# Patient Record
Sex: Male | Born: 1955 | Race: White | Hispanic: No | Smoking: Current every day smoker
Health system: Southern US, Community
[De-identification: ages and names within clinical notes are randomized; demographics above are authoritative.]

## PROBLEM LIST (undated history)

## (undated) DIAGNOSIS — M1990 Unspecified osteoarthritis, unspecified site: Secondary | ICD-10-CM

## (undated) DIAGNOSIS — Z8659 Personal history of other mental and behavioral disorders: Secondary | ICD-10-CM

## (undated) DIAGNOSIS — F1010 Alcohol abuse, uncomplicated: Secondary | ICD-10-CM

## (undated) DIAGNOSIS — M549 Dorsalgia, unspecified: Secondary | ICD-10-CM

## (undated) DIAGNOSIS — F1910 Other psychoactive substance abuse, uncomplicated: Secondary | ICD-10-CM

## (undated) DIAGNOSIS — T7840XA Allergy, unspecified, initial encounter: Secondary | ICD-10-CM

## (undated) DIAGNOSIS — I10 Essential (primary) hypertension: Secondary | ICD-10-CM

## (undated) DIAGNOSIS — G629 Polyneuropathy, unspecified: Secondary | ICD-10-CM

## (undated) DIAGNOSIS — H409 Unspecified glaucoma: Secondary | ICD-10-CM

## (undated) DIAGNOSIS — J449 Chronic obstructive pulmonary disease, unspecified: Secondary | ICD-10-CM

## (undated) DIAGNOSIS — H269 Unspecified cataract: Secondary | ICD-10-CM

## (undated) DIAGNOSIS — K760 Fatty (change of) liver, not elsewhere classified: Secondary | ICD-10-CM

## (undated) DIAGNOSIS — M2550 Pain in unspecified joint: Secondary | ICD-10-CM

## (undated) HISTORY — DX: Dorsalgia, unspecified: M54.9

## (undated) HISTORY — PX: LYMPH NODE BIOPSY: SHX201

## (undated) HISTORY — DX: Polyneuropathy, unspecified: G62.9

## (undated) HISTORY — PX: TONSILLECTOMY AND ADENOIDECTOMY: SHX28

## (undated) HISTORY — DX: Unspecified glaucoma: H40.9

## (undated) HISTORY — DX: Personal history of other mental and behavioral disorders: Z86.59

## (undated) HISTORY — DX: Other psychoactive substance abuse, uncomplicated: F19.10

## (undated) HISTORY — DX: Essential (primary) hypertension: I10

## (undated) HISTORY — DX: Unspecified cataract: H26.9

## (undated) HISTORY — DX: Allergy, unspecified, initial encounter: T78.40XA

## (undated) HISTORY — DX: Pain in unspecified joint: M25.50

## (undated) HISTORY — DX: Unspecified osteoarthritis, unspecified site: M19.90

---
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
## 2011-04-30 ENCOUNTER — Ambulatory Visit (HOSPITAL_COMMUNITY)
Admission: RE | Admit: 2011-04-30 | Discharge: 2011-04-30 | Disposition: A | Discharge status: Home / Self Care | Payer: PRIVATE HEALTH INSURANCE | Source: Ambulatory Visit | Attending: Family Medicine | Admitting: Family Medicine

## 2011-04-30 ENCOUNTER — Other Ambulatory Visit: Payer: Self-pay | Admitting: Family Medicine

## 2011-04-30 DIAGNOSIS — R109 Unspecified abdominal pain: Secondary | ICD-10-CM | POA: Insufficient documentation

## 2011-04-30 DIAGNOSIS — R319 Hematuria, unspecified: Secondary | ICD-10-CM | POA: Insufficient documentation

## 2011-05-10 ENCOUNTER — Ambulatory Visit (AMBULATORY_SURGERY_CENTER): Payer: PRIVATE HEALTH INSURANCE

## 2011-05-10 VITALS — Ht 74.0 in | Wt 308.1 lb

## 2011-05-10 DIAGNOSIS — Z1211 Encounter for screening for malignant neoplasm of colon: Secondary | ICD-10-CM

## 2011-05-10 DIAGNOSIS — M549 Dorsalgia, unspecified: Secondary | ICD-10-CM

## 2011-05-10 DIAGNOSIS — Z800 Family history of malignant neoplasm of digestive organs: Secondary | ICD-10-CM

## 2011-05-10 MED ORDER — PEG-KCL-NACL-NASULF-NA ASC-C 100 G PO SOLR: 1.0000 | Freq: Once | ORAL | Status: AC

## 2011-05-20 ENCOUNTER — Encounter: Payer: Self-pay | Admitting: Gastroenterology

## 2011-05-20 ENCOUNTER — Ambulatory Visit (AMBULATORY_SURGERY_CENTER): Payer: PRIVATE HEALTH INSURANCE | Admitting: Gastroenterology

## 2011-05-20 DIAGNOSIS — Z1211 Encounter for screening for malignant neoplasm of colon: Secondary | ICD-10-CM

## 2011-05-20 DIAGNOSIS — D1330 Benign neoplasm of unspecified part of small intestine: Secondary | ICD-10-CM

## 2011-05-20 DIAGNOSIS — Z800 Family history of malignant neoplasm of digestive organs: Secondary | ICD-10-CM

## 2011-05-20 DIAGNOSIS — D126 Benign neoplasm of colon, unspecified: Secondary | ICD-10-CM

## 2011-05-20 MED ORDER — SODIUM CHLORIDE 0.9 % IV SOLN: 500.0000 mL | INTRAVENOUS | Status: DC

## 2011-05-20 NOTE — Patient Instructions (Signed)
No aspirin or aspirin products such as anti-inflammatory drugs for 2 weeks. No aspirin No Aleve No ibuprofen Take Tylenol only

## 2011-05-23 ENCOUNTER — Telehealth: Payer: Self-pay | Admitting: *Deleted

## 2011-05-23 NOTE — Telephone Encounter (Signed)

## 2011-05-28 ENCOUNTER — Encounter: Payer: Self-pay | Admitting: Gastroenterology

## 2011-11-08 ENCOUNTER — Ambulatory Visit (INDEPENDENT_AMBULATORY_CARE_PROVIDER_SITE_OTHER): Payer: PRIVATE HEALTH INSURANCE

## 2011-11-08 DIAGNOSIS — Z23 Encounter for immunization: Secondary | ICD-10-CM

## 2011-11-08 DIAGNOSIS — J019 Acute sinusitis, unspecified: Secondary | ICD-10-CM

## 2012-03-17 ENCOUNTER — Ambulatory Visit: Payer: PRIVATE HEALTH INSURANCE

## 2012-03-17 ENCOUNTER — Ambulatory Visit (INDEPENDENT_AMBULATORY_CARE_PROVIDER_SITE_OTHER): Payer: PRIVATE HEALTH INSURANCE | Admitting: Emergency Medicine

## 2012-03-17 VITALS — BP 131/91 | HR 80 | Temp 98.1°F | Resp 18 | Ht 72.0 in | Wt 287.0 lb

## 2012-03-17 DIAGNOSIS — F17200 Nicotine dependence, unspecified, uncomplicated: Secondary | ICD-10-CM

## 2012-03-17 DIAGNOSIS — R7309 Other abnormal glucose: Secondary | ICD-10-CM

## 2012-03-17 DIAGNOSIS — G629 Polyneuropathy, unspecified: Secondary | ICD-10-CM

## 2012-03-17 DIAGNOSIS — G589 Mononeuropathy, unspecified: Secondary | ICD-10-CM

## 2012-03-17 DIAGNOSIS — R739 Hyperglycemia, unspecified: Secondary | ICD-10-CM

## 2012-03-17 LAB — COMPREHENSIVE METABOLIC PANEL
BUN: 13 mg/dL (ref 6–23)
CO2: 26 mEq/L (ref 19–32)
Calcium: 9.9 mg/dL (ref 8.4–10.5)
Creat: 0.94 mg/dL (ref 0.50–1.35)
Glucose, Bld: 110 mg/dL — ABNORMAL HIGH (ref 70–99)
Total Bilirubin: 0.8 mg/dL (ref 0.3–1.2)

## 2012-03-17 LAB — MAGNESIUM: Magnesium: 2 mg/dL (ref 1.5–2.5)

## 2012-03-17 LAB — VITAMIN B12: Vitamin B-12: 249 pg/mL (ref 211–911)

## 2012-03-17 LAB — POCT CBC
HCT, POC: 45.9 % (ref 43.5–53.7)
Lymph, poc: 1.7 (ref 0.6–3.4)
MCH, POC: 32.5 pg — AB (ref 27–31.2)
MCHC: 32.5 g/dL (ref 31.8–35.4)
MCV: 100.1 fL — AB (ref 80–97)
POC Granulocyte: 3.9 (ref 2–6.9)
POC LYMPH PERCENT: 28.2 %L (ref 10–50)
RDW, POC: 13.4 %

## 2012-03-17 LAB — POCT GLYCOSYLATED HEMOGLOBIN (HGB A1C): Hemoglobin A1C: 5

## 2012-03-17 LAB — GLUCOSE, POCT (MANUAL RESULT ENTRY): POC Glucose: 104 mg/dl — AB (ref 70–99)

## 2012-03-17 MED ORDER — GABAPENTIN 300 MG PO CAPS: ORAL_CAPSULE | ORAL | Status: DC

## 2012-03-17 NOTE — Progress Notes (Signed)
  Subjective:    Patient ID: Elijah Barton, male    DOB: March 30, 1956, 56 y.o.   MRN: [id number]  HPI the patient presents with a two-month history of tingling in his feet he does not have any associated weakness in his legs. He just feels this sensation of numbness in both of his feet extending up to his distal legs. Patient's history is pertinent in that he drinks half pint or a pint every day. He smokes 2-3 packs of cigarettes a day. On his last set of labs his sugar was elevated.    Review of Systems     Objective:   Physical Exam  Constitutional: He is oriented to person, place, and time. He appears well-developed and well-nourished.  HENT:  Head: Normocephalic and atraumatic.  Eyes: Pupils are equal, round, and reactive to light.  Neck: No tracheal deviation present. No thyromegaly present.  Cardiovascular: Normal rate, regular rhythm and normal heart sounds.   Pulmonary/Chest: No respiratory distress. He has no wheezes. He has no rales.  Abdominal: Soft. He exhibits no distension. There is no tenderness. There is no rebound.  Neurological: He is alert and oriented to person, place, and time. He has normal reflexes. No cranial nerve deficit.       There is decreased vibratory sensation in both feet. Position sense is maintained. The alignment testing revealed decreased sensation on the plantar surface of both feet. His deep tendon reflexes in the ankles and knees with maintained. Motor strength is 5 out of 5 in the lower extremities.    No results found for this or any previous visit.  Results for orders placed in visit on 03/17/12  POCT CBC      Component Value Range   WBC 6.0  4.6 - 10.2 (K/uL)   Lymph, poc 1.7  0.6 - 3.4    POC LYMPH PERCENT 28.2  10 - 50 (%L)   MID (cbc) 0.4  0 - 0.9    POC MID % 6.0  0 - 12 (%M)   POC Granulocyte 3.9  2 - 6.9    Granulocyte percent 65.8  37 - 80 (%G)   RBC 4.59 (*) 4.69 - 6.13 (M/uL)   Hemoglobin 14.9  14.1 - 18.1 (g/dL)   HCT, POC 28.4   13.2 - 53.7 (%)   MCV 100.1 (*) 80 - 97 (fL)   MCH, POC 32.5 (*) 27 - 31.2 (pg)   MCHC 32.5  31.8 - 35.4 (g/dL)   RDW, POC 44.0     Platelet Count, POC 218  142 - 424 (K/uL)   MPV 9.8  0 - 99.8 (fL)  GLUCOSE, POCT (MANUAL RESULT ENTRY)      Component Value Range   POC Glucose 104 (*) 70 - 99 (mg/dl)  POCT GLYCOSYLATED HEMOGLOBIN (HGB A1C)      Component Value Range   Hemoglobin A1C 5.0     [facility] reading (PRIMARY) by  Dr. Cleta Alberts there is prominence of the right infrahilar area. There is no definite mass seen on the lateral.      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 We'll check routine labs as well as a chest x-ray and discuss potentially doing EMG nerve conduction studies to confirm the diagnosis.

## 2012-03-18 LAB — RPR

## 2012-03-22 ENCOUNTER — Ambulatory Visit
Admission: RE | Admit: 2012-03-22 | Discharge: 2012-03-22 | Disposition: A | Discharge status: Home / Self Care | Payer: Self-pay | Source: Ambulatory Visit | Attending: Emergency Medicine | Admitting: Emergency Medicine

## 2012-03-22 DIAGNOSIS — F17200 Nicotine dependence, unspecified, uncomplicated: Secondary | ICD-10-CM

## 2012-05-20 ENCOUNTER — Ambulatory Visit (INDEPENDENT_AMBULATORY_CARE_PROVIDER_SITE_OTHER): Payer: PRIVATE HEALTH INSURANCE | Admitting: Family Medicine

## 2012-05-20 VITALS — BP 131/85 | HR 84 | Temp 98.5°F | Resp 16 | Ht 72.25 in | Wt 282.0 lb

## 2012-05-20 DIAGNOSIS — L570 Actinic keratosis: Secondary | ICD-10-CM

## 2012-05-20 DIAGNOSIS — L918 Other hypertrophic disorders of the skin: Secondary | ICD-10-CM

## 2012-05-20 DIAGNOSIS — I10 Essential (primary) hypertension: Secondary | ICD-10-CM

## 2012-05-20 DIAGNOSIS — Q828 Other specified congenital malformations of skin: Secondary | ICD-10-CM

## 2012-05-20 NOTE — Progress Notes (Signed)
Subjective: Patient is here for some skin lesions have been concerning him. He has a tag under his right arm that is swollen up and turned dark in the last few days. He worries about skin cancers. He has a little lesion on his right forearm which is crusted. He has multiple other small skin tags. He is not diabetic, though was told he might be prediabetic. He has been working on his weight. 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  Objective: Obese white male in no acute distress. He has a lot of small skin tags in his right axilla, some tiny ones in the left. He has one large tag about 1 CM in diameter at the tip of it which is black and blue looking at the tip of it, with a pink stalk measuring about 4 mm in diameter and 1 cm long. He also has a crusted lesion with almost a verrucous look to it on the right forearm. However it does not look like a wart., More like an actinic keratosis.  Assessment: Ischemic skin tag right axilla Multiple benign skin tags Actinic keratosis right forearm  Plan: Shave biopsy the larger skin today in the actinic keratosis. He is agreeable to this. Told him it would  leave little burn scars.  Procedure note: Lesions with up with 1% lidocaine with epinephrine. The lesions were prepped with alcohol swab. Shave biopsy performed of the large necrotic A. right axilla, as well as 2 small ones. The bases were cauterized. The large and was sent to pathology.  The actinic keratosis on the right forearm was also removed by shave biopsy. It measured less than 0.5 cm. It was sent for pathology. The base was cauterized. Patient tolerated procedures well. He was instructed in the care.

## 2012-05-20 NOTE — Patient Instructions (Addendum)
Local burn care of the wounds. Keep them clean, use a little Polysporin ointment, and a Band-Aid.  I will let you know the results when the pathology comes back.  If you do not hear from me in 2 weeks call back to check on the report.

## 2012-05-27 ENCOUNTER — Encounter: Payer: Self-pay | Admitting: Family Medicine

## 2012-06-03 ENCOUNTER — Ambulatory Visit: Payer: PRIVATE HEALTH INSURANCE

## 2012-06-03 ENCOUNTER — Ambulatory Visit (INDEPENDENT_AMBULATORY_CARE_PROVIDER_SITE_OTHER): Payer: PRIVATE HEALTH INSURANCE | Admitting: Emergency Medicine

## 2012-06-03 VITALS — BP 148/98 | HR 78 | Temp 98.4°F | Resp 17 | Ht 72.0 in | Wt 285.0 lb

## 2012-06-03 DIAGNOSIS — H5710 Ocular pain, unspecified eye: Secondary | ICD-10-CM

## 2012-06-03 DIAGNOSIS — M549 Dorsalgia, unspecified: Secondary | ICD-10-CM

## 2012-06-03 DIAGNOSIS — H5712 Ocular pain, left eye: Secondary | ICD-10-CM

## 2012-06-03 DIAGNOSIS — L0291 Cutaneous abscess, unspecified: Secondary | ICD-10-CM

## 2012-06-03 DIAGNOSIS — L0390 Cellulitis, unspecified: Secondary | ICD-10-CM

## 2012-06-03 LAB — POCT CBC
Granulocyte percent: 69.2 %G (ref 37–80)
HCT, POC: 51.6 % (ref 43.5–53.7)
Hemoglobin: 16 g/dL (ref 14.1–18.1)
POC Granulocyte: 4.7 (ref 2–6.9)
RBC: 5.02 M/uL (ref 4.69–6.13)

## 2012-06-03 MED ORDER — DOXYCYCLINE HYCLATE 100 MG PO TABS: 100.0000 mg | ORAL_TABLET | Freq: Two times a day (BID) | ORAL | Status: AC

## 2012-06-03 MED ORDER — HYDROCODONE-ACETAMINOPHEN 5-325 MG PO TABS
1.0000 | ORAL_TABLET | Freq: Four times a day (QID) | ORAL | Status: AC | PRN
Start: 2012-06-03 — End: 2012-06-13

## 2012-06-03 MED ORDER — CYCLOBENZAPRINE HCL 10 MG PO TABS: ORAL_TABLET | ORAL | Status: DC

## 2012-06-03 MED ORDER — DOXYCYCLINE HYCLATE 100 MG PO TABS: 100.0000 mg | ORAL_TABLET | Freq: Two times a day (BID) | ORAL | Status: DC

## 2012-06-03 MED ORDER — VALACYCLOVIR HCL 1 G PO TABS: 1000.0000 mg | ORAL_TABLET | Freq: Two times a day (BID) | ORAL | Status: DC

## 2012-06-03 NOTE — Progress Notes (Signed)
  Subjective:    Patient ID: Elijah Barton, male    DOB: Mar 17, 1956, 56 y.o.   MRN: [id number]  HPI she has a five-day history of pain and discomfort in the area beneath his left eye. He had a cold sore on his lip about 3 weeks ago this resolved without treatment . He has had no visual problems or pain in his eye. He also has had midthoracic back pain. 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 He initially was seen a chiropractor but this became very expensive so he stopped seeing them.   Review of Systems     Objective:   Physical Exam  Constitutional: He appears well-developed and well-nourished.  HENT:  Head: Normocephalic.  Eyes: Pupils are equal, round, and reactive to light.  Cardiovascular: Normal rate and regular rhythm.   Pulmonary/Chest: Effort normal and breath sounds normal.  Musculoskeletal:       There is tenderness over the parathoracic area and flank areas bilaterally. There is no tenderness directly over the T-spine.    [facility] reading (PRIMARY) by  Dr. Cleta Alberts is a prominent inferior right perihilar area. There is also question of a compression fracture in the lower thoracic spine.   Results for orders placed in visit on 06/03/12  POCT CBC      Component Value Range   WBC 6.8  4.6 - 10.2 K/uL   Lymph, poc 1.6  0.6 - 3.4   POC LYMPH PERCENT 24.0  10 - 50 %L   MID (cbc) 0.5  0 - 0.9   POC MID % 6.8  0 - 12 %M   POC Granulocyte 4.7  2 - 6.9   Granulocyte percent 69.2  37 - 80 %G   RBC 5.02  4.69 - 6.13 M/uL   Hemoglobin 16.0  14.1 - 18.1 g/dL   HCT, POC 40.9  81.1 - 53.7 %   MCV 102.7 (*) 80 - 97 fL   MCH, POC 31.9 (*) 27 - 31.2 pg   MCHC 31.0 (*) 31.8 - 35.4 g/dL   RDW, POC 91.4     Platelet Count, POC 211  142 - 424 K/uL   MPV 9.5  0 - 99.8 fL       Assessment & Plan:  We'll treat with Valtrex and doxycycline for his infection beneath the left. Eye. His chest x-rays questionably abnormal in the inferior  hilar area. He also may have an old thoracic compression fracture. I am going to suggest that he have a CT of his chest to be sure there is no cancer there because of his heavy smoking history.

## 2012-06-04 LAB — COMPREHENSIVE METABOLIC PANEL
Albumin: 4.3 g/dL (ref 3.5–5.2)
Alkaline Phosphatase: 70 U/L (ref 39–117)
CO2: 28 mEq/L (ref 19–32)
Glucose, Bld: 91 mg/dL (ref 70–99)
Potassium: 4.9 mEq/L (ref 3.5–5.3)
Sodium: 139 mEq/L (ref 135–145)
Total Protein: 7.2 g/dL (ref 6.0–8.3)

## 2012-06-10 ENCOUNTER — Other Ambulatory Visit: Payer: Self-pay | Admitting: Family Medicine

## 2012-06-11 NOTE — Telephone Encounter (Signed)
Deny, needs OV to discuss.

## 2012-06-12 ENCOUNTER — Other Ambulatory Visit: Payer: Self-pay

## 2012-06-12 ENCOUNTER — Telehealth: Payer: Self-pay

## 2012-06-12 MED ORDER — NAPROXEN 500 MG PO TABS: 500.0000 mg | ORAL_TABLET | Freq: Two times a day (BID) | ORAL | Status: DC

## 2012-06-12 NOTE — Telephone Encounter (Signed)
I called patient to find out what meds he is in need of, he does not know. I have advised him to contact his pharmacy and have them send [place] there request.

## 2012-06-12 NOTE — Telephone Encounter (Signed)
Pt is requesting dr Cleta Alberts reactive all of his medications.  The order has expired.  Recently seen for Back pain cyclobenzaprine (FLEXERIL) 10 MG tablet He does not remember the names of the other medication in which he needs refilled also.

## 2012-07-21 ENCOUNTER — Telehealth: Payer: Self-pay

## 2012-07-21 NOTE — Telephone Encounter (Signed)
They are safe to take together he just needs to be mindful of sedating effect of both medications. He should not take them both and drive or go to work.

## 2012-07-21 NOTE — Telephone Encounter (Signed)
Spoke with pt advised message from [place]. Pt understood

## 2012-07-21 NOTE — Telephone Encounter (Signed)
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  WANTS TO KNOW IF HE CAN TAKE BOTH OF THEM AT THE SAME TIME WITHOUT IT CAUSING AN ADVERSE REACTION.  HE USUALLY DOESN'T TAKE ANYTHING THROUGH THE WEEK, BUT HE IS HAVING A VERY HARD TIME TODAY.  PLEASE CALL [phone]

## 2012-08-28 ENCOUNTER — Ambulatory Visit (INDEPENDENT_AMBULATORY_CARE_PROVIDER_SITE_OTHER): Payer: PRIVATE HEALTH INSURANCE | Admitting: Physician Assistant

## 2012-08-28 ENCOUNTER — Telehealth: Payer: Self-pay

## 2012-08-28 VITALS — BP 153/84 | HR 97 | Temp 97.6°F | Resp 16 | Ht 71.75 in | Wt 282.8 lb

## 2012-08-28 DIAGNOSIS — M549 Dorsalgia, unspecified: Secondary | ICD-10-CM

## 2012-08-28 DIAGNOSIS — M62838 Other muscle spasm: Secondary | ICD-10-CM

## 2012-08-28 MED ORDER — HYDROCODONE-ACETAMINOPHEN 5-325 MG PO TABS: 1.0000 | ORAL_TABLET | Freq: Four times a day (QID) | ORAL | Status: DC | PRN

## 2012-08-28 MED ORDER — CYCLOBENZAPRINE HCL 10 MG PO TABS: ORAL_TABLET | ORAL | Status: DC

## 2012-08-28 NOTE — Progress Notes (Signed)
  Subjective:    Patient ID: Elijah Barton, male    DOB: [birth year], 56 y.o.   MRN: [id number]  HPI 56 year old male presents with 10 day history of right sided back pain and muscle spasm. States he had an injury in August 2013 and was treated with pain medication and a muscle relaxer which helped. He did not get back to 100%, but states his pain was much improved.  He was working in the yard 10 days ago and was bending over and felt like he pulled a muscle. He has been taking what he had left of his Vicodin and Flexeril which has helped.  Denies paresthesias, weakness, loss of bowel/bladder control, saddle anesthesia, urinary frequency, dysuria, or abdominal pain.      Review of Systems  Constitutional: Negative for fever and chills.  Gastrointestinal: Negative for nausea, vomiting and abdominal pain.  Genitourinary: Negative for dysuria, urgency, frequency and flank pain.  Musculoskeletal: Positive for myalgias (muscle spasm) and back pain. Negative for joint swelling, arthralgias and gait problem.  All other systems reviewed and are negative.       Objective:   Physical Exam  Constitutional: He is oriented to person, place, and time. He appears well-developed and well-nourished.  HENT:  Head: Normocephalic and atraumatic.  Right Ear: External ear normal.  Left Ear: External ear normal.  Eyes: Conjunctivae normal are normal.  Neck: Normal range of motion.  Cardiovascular: Normal rate, regular rhythm and normal heart sounds.   Pulmonary/Chest: Effort normal and breath sounds normal.  Abdominal: There is no tenderness (no CVA tenderness bilaterally).  Musculoskeletal:       Thoracic back: He exhibits tenderness (right paraspinal) and spasm (on right).       Lumbar back: He exhibits tenderness (right paraspinal, no midline or bony tenderness) and spasm (right). He exhibits normal range of motion, no swelling and no deformity.       Negative SLR  Neurological: He is alert and oriented  to person, place, and time.  Psychiatric: He has a normal mood and affect. His behavior is normal. Judgment and thought content normal.          Assessment & Plan:   1. Back pain  cyclobenzaprine (FLEXERIL) 10 MG tablet  2. Muscle spasm  HYDROcodone-acetaminophen (NORCO) 5-325 MG per tablet   Refill Flexeril and Vicodin to take as needed Back manual given for him to do exercises at home Heating pad prn If no improvement in symptoms over next 1-2 weeks, recommend referral for physical therapy RTC if symptoms worsen

## 2013-01-07 ENCOUNTER — Ambulatory Visit (INDEPENDENT_AMBULATORY_CARE_PROVIDER_SITE_OTHER): Payer: PRIVATE HEALTH INSURANCE | Admitting: Emergency Medicine

## 2013-01-07 VITALS — BP 152/80 | HR 84 | Temp 98.6°F | Resp 18 | Ht 72.25 in | Wt 291.8 lb

## 2013-01-07 DIAGNOSIS — A088 Other specified intestinal infections: Secondary | ICD-10-CM

## 2013-01-07 MED ORDER — ONDANSETRON 8 MG PO TBDP: 8.0000 mg | ORAL_TABLET | Freq: Three times a day (TID) | ORAL | Status: DC | PRN

## 2013-01-07 MED ORDER — LOPERAMIDE HCL 2 MG PO TABS: ORAL_TABLET | ORAL | Status: DC

## 2013-01-07 NOTE — Progress Notes (Signed)
[facility] [address], [place] [address] [phone]  Date:  01/07/2013   Name:  Elijah Barton   DOB:  15-Sep-1956   MRN:  [id number]  PCP:  No primary provider on file.    Chief Complaint: Generalized Body Aches, Diarrhea, Chills, Fever, Abdominal Pain and Medication Refill   History of Present Illness:  Elijah Barton is a 57 y.o. very pleasant male patient who presents with the following:  Ill since yesterday with diarrhea and chills.  Has nausea and poor po intake.  Abdominal cramping. The patient has no complaint of blood, mucous, or pus in her stools.  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  No improvement with over the counter medications or other home remedies. Denies other complaint or health concern today.   There is no problem list on file for this patient.   Past Medical History  Diagnosis Date  . Joint pain     all over except for hip  . Back pain     Past Surgical History  Procedure Laterality Date  . Lymph node biopsy      neck  . Tonsilectomy, adenoidectomy, bilateral myringotomy and tubes      History  Substance Use Topics  . Smoking status: Current Every Day Smoker -- 2.00 packs/day for 40 years    Types: Cigarettes  . Smokeless tobacco: Never Used  . Alcohol Use: 7.5 oz/week    15 drink(s) per week    Family History  Problem Relation Age of Onset  . Colon cancer Mother   . Heart disease Mother   . Stroke Mother   . Cirrhosis Father     Allergies  Allergen Reactions  . Penicillins     Medication list has been reviewed and updated.  Current Outpatient Prescriptions on File Prior to Visit  Medication Sig Dispense Refill  . aspirin 81 MG tablet Take 81 mg by mouth daily.        Marland Kitchen b complex vitamins capsule Take 1 capsule by mouth daily.      . fish oil-omega-3 fatty acids 1000 MG capsule Take 1 g by mouth daily.        . magnesium 30 MG tablet Take 30 mg by mouth daily.        . polycarbophil (FIBERCON) 625  MG tablet Take 625 mg by mouth daily.      . Probiotic Product (PROBIOTIC FORMULA PO) Take by mouth daily.        . cyclobenzaprine (FLEXERIL) 10 MG tablet Take one tablet at night as needed for muscle relaxant  30 tablet  0  . gabapentin (NEURONTIN) 300 MG capsule Start at one capsule at night for the first week and then increase to one in the morning and one at night.  60 capsule  3  . HYDROcodone-acetaminophen (NORCO) 5-325 MG per tablet Take 1 tablet by mouth every 6 (six) hours as needed for pain.  30 tablet  0  . Multiple Vitamin (MULTIVITAMIN) tablet Take 1 tablet by mouth daily.        . naproxen (NAPROSYN) 500 MG tablet Take 1 tablet (500 mg total) by mouth 2 (two) times daily with a meal.  30 tablet  0  . valACYclovir (VALTREX) 1000 MG tablet Take 1 tablet (1,000 mg total) by mouth 2 (two) times daily.  20 tablet  0   No current facility-administered medications on file prior to visit.    Review of Systems:  As  per HPI, otherwise negative.    Physical Examination: Filed Vitals:   01/07/13 1409  BP: 152/80  Pulse: 84  Temp: 98.6 F (37 C)  Resp: 18   Filed Vitals:   01/07/13 1409  Height: 6' 0.25" (1.835 m)  Weight: 291 lb 12.8 oz (132.36 kg)   Body mass index is 39.31 kg/(m^2). Ideal Body Weight: Weight in (lb) to have BMI = 25: 185.2  GEN: WDWN, ill appearing, Non-toxic, A & O x 3  Not icteric.  No rash HEENT: Atraumatic, Normocephalic. Neck supple. No masses, No LAD. Ears and Nose: No external deformity. CV: RRR, No M/G/R. No JVD. No thrill. No extra heart sounds. PULM: CTA B, no wheezes, crackles, rhonchi. No retractions. No resp. distress. No accessory muscle use. ABD: S, NT, ND, +BS. No rebound. No HSM. EXTR: No c/c/e NEURO Normal gait.  PSYCH: Normally interactive. Conversant. Not depressed or anxious appearing.  Calm demeanor.    Assessment and Plan: Viral gastroenteritis Clears Imodium zofran  Signed,  Phillips Odor, MD

## 2013-01-07 NOTE — Patient Instructions (Addendum)
Diarrhea Infections caused by germs (bacterial) or a virus commonly cause diarrhea. Your caregiver has determined that with time, rest and fluids, the diarrhea should improve. In general, eat normally while drinking more water than usual. Although water may prevent dehydration, it does not contain salt and minerals (electrolytes). Broths, weak tea without caffeine and oral rehydration solutions (ORS) replace fluids and electrolytes. Small amounts of fluids should be taken frequently. Large amounts at one time may not be tolerated. Plain water may be harmful in infants and the elderly. Oral rehydrating solutions (ORS) are available at pharmacies and grocery stores. ORS replace water and important electrolytes in proper proportions. Sports drinks are not as effective as ORS and may be harmful due to sugars worsening diarrhea.  ORS is especially recommended for use in children with diarrhea. As a general guideline for children, replace any new fluid losses from diarrhea and/or vomiting with ORS as follows:  If your child weighs 22 pounds or under (10 kg or less), give 60-120 mL ( -  cup or 2 - 4 ounces) of ORS for each episode of diarrheal stool or vomiting episode.  If your child weighs more than 22 pounds (more than 10 kgs), give 120-240 mL ( - 1 cup or 4 - 8 ounces) of ORS for each diarrheal stool or episode of vomiting.  While correcting for dehydration, children should eat normally. However, foods high in sugar should be avoided because this may worsen diarrhea. Large amounts of carbonated soft drinks, juice, gelatin desserts and other highly sugared drinks should be avoided.  After correction of dehydration, other liquids that are appealing to the child may be added. Children should drink small amounts of fluids frequently and fluids should be increased as tolerated. Children should drink enough fluids to keep urine clear or pale yellow.  Adults should eat normally while drinking more fluids than  usual. Drink small amounts of fluids frequently and increase as tolerated. Drink enough fluids to keep urine clear or pale yellow. Broths, weak decaffeinated tea, lemon lime soft drinks (allowed to go flat) and ORS replace fluids and electrolytes.  Avoid:  Carbonated drinks.  Juice.  Extremely hot or cold fluids.  Caffeine drinks.  Fatty, greasy foods.  Alcohol.  Tobacco.  Too much intake of anything at one time.  Gelatin desserts.  Probiotics are active cultures of beneficial bacteria. They may lessen the amount and number of diarrheal stools in adults. Probiotics can be found in yogurt with active cultures and in supplements.  Wash hands well to avoid spreading bacteria and virus.  Anti-diarrheal medications are not recommended for infants and children.  Only take over-the-counter or prescription medicines for pain, discomfort or fever as directed by your caregiver. Do not give aspirin to children because it may cause Reye's Syndrome.  For adults, ask your caregiver if you should continue all prescribed and over-the-counter medicines.  If your caregiver has given you a follow-up appointment, it is very important to keep that appointment. Not keeping the appointment could result in a chronic or permanent injury, and disability. If there is any problem keeping the appointment, you must call back to this facility for assistance. SEEK IMMEDIATE MEDICAL CARE IF:   You or your child is unable to keep fluids down or other symptoms or problems become worse in spite of treatment.  Vomiting or diarrhea develops and becomes persistent.  There is vomiting of blood or bile (green material).  There is blood in the stool or the stools are black and   tarry.  There is no urine output in 6-8 hours or there is only a small amount of very dark urine.  Abdominal pain develops, increases or localizes.  You have a fever.  Your baby is older than 3 months with a rectal temperature of 102 F  (38.9 C) or higher.  Your baby is 20 months old or younger with a rectal temperature of 100.4 F (38 C) or higher.  You or your child develops excessive weakness, dizziness, fainting or extreme thirst.  You or your child develops a rash, stiff neck, severe headache or become irritable or sleepy and difficult to awaken. MAKE SURE YOU:   Understand these instructions.  Will watch your condition.  Will get help right away if you are not doing well or get worse. Document Released: 09/23/2002 Document Revised: 12/26/2011 Document Reviewed: 08/10/2009 [facility] Patient Information 2013 [place], [place]. Clear Liquid Diet The clear liquid dietconsists of foods that are liquid or will become liquid at room temperature.You should be able to see through the liquid and beverages. Examples of foods allowed on a clear liquid diet include fruit juice, broth or bouillon, gelatin, or frozen ice pops. The purpose of this diet is to provide necessary fluid, electrolytes such as sodium and potassium, and energy to keep the body functioning during times when you are not able to consume a regular diet.A clear liquid diet should not be continued for long periods of time as it is not nutritionally adequate.  REASONS FOR USING A CLEAR LIQUID DIET  In sudden onset (acute) conditions for a patient before or after surgery.  As the first step in oral feeding.  For fluid and electrolyte replacement in diarrheal diseases.  As a diet before certain medical tests are performed. ADEQUACY The clear liquid diet is adequate only in ascorbic acid, according to the Recommended Dietary Allowances of the [employer]. CHOOSING FOODS Breads and Starches  Allowed:  None are allowed.  Avoid: All are avoided. Vegetables  Allowed:  Strained tomato or vegetable juice.  Avoid: Any others. Fruit  Allowed:  Strained fruit juices and fruit drinks. Include 1 serving of citrus or vitamin C-enriched fruit juice  daily.  Avoid: Any others. Meat and Meat Substitutes  Allowed:  None are allowed.  Avoid: All are avoided. Milk  Allowed:  None are allowed.  Avoid: All are avoided. Soups and Combination Foods  Allowed:  Clear bouillon, broth, or strained broth-based soups.  Avoid: Any others. Desserts and Sweets  Allowed:  Sugar, honey. High protein gelatin. Flavored gelatin, ices, or frozen ice pops that do not contain milk.  Avoid: Any others. Fats and Oils  Allowed:  None are allowed.  Avoid: All are avoided. Beverages  Allowed: Cereal beverages, coffee (regular or decaffeinated), tea, or soda at the discretion of your caregiver.  Avoid: Any others. Condiments  Allowed:  Iodized salt.  Avoid: Any others, including pepper. Supplements  Allowed:  Liquid nutrition beverages.  Avoid: Any others that contain lactose or fiber. SAMPLE MEAL PLAN Breakfast  4 oz (120 mL) strained orange juice.   to 1 cup (125 to 250 mL) gelatin (plain or fortified).  1 cup (250 mL) beverage (coffee or tea).  Sugar, if desired. Midmorning Snack   cup (125 mL) gelatin (plain or fortified). Lunch  1 cup (250 mL) broth or consomm.  4 oz (120 mL) strained grapefruit juice.   cup (125 mL) gelatin (plain or fortified).  1 cup (250 mL) beverage (coffee or tea).  Sugar, if  desired. Midafternoon Snack   cup (125 mL) fruit ice.   cup (125 mL) strained fruit juice. Dinner  1 cup (250 mL) broth or consomm.   cup (125 mL) cranberry juice.   cup (125 mL) flavored gelatin (plain or fortified).  1 cup (250 mL) beverage (coffee or tea).  Sugar, if desired. Evening Snack  4 oz (120 mL) strained apple juice (vitamin C-fortified).   cup (125 mL) flavored gelatin (plain or fortified). Document Released: 10/03/2005 Document Revised: 12/26/2011 Document Reviewed: 12/31/2010 [facility] Patient Information 2013 [place], [place].

## 2013-03-12 ENCOUNTER — Ambulatory Visit (INDEPENDENT_AMBULATORY_CARE_PROVIDER_SITE_OTHER): Payer: PRIVATE HEALTH INSURANCE | Admitting: Family Medicine

## 2013-03-12 ENCOUNTER — Ambulatory Visit: Payer: PRIVATE HEALTH INSURANCE

## 2013-03-12 VITALS — BP 148/78 | HR 92 | Temp 98.1°F | Resp 18 | Ht 72.5 in | Wt 292.2 lb

## 2013-03-12 DIAGNOSIS — IMO0001 Reserved for inherently not codable concepts without codable children: Secondary | ICD-10-CM

## 2013-03-12 DIAGNOSIS — Z125 Encounter for screening for malignant neoplasm of prostate: Secondary | ICD-10-CM

## 2013-03-12 DIAGNOSIS — G629 Polyneuropathy, unspecified: Secondary | ICD-10-CM | POA: Insufficient documentation

## 2013-03-12 DIAGNOSIS — Z720 Tobacco use: Secondary | ICD-10-CM

## 2013-03-12 DIAGNOSIS — D7589 Other specified diseases of blood and blood-forming organs: Secondary | ICD-10-CM

## 2013-03-12 DIAGNOSIS — G609 Hereditary and idiopathic neuropathy, unspecified: Secondary | ICD-10-CM

## 2013-03-12 DIAGNOSIS — M549 Dorsalgia, unspecified: Secondary | ICD-10-CM

## 2013-03-12 DIAGNOSIS — F17200 Nicotine dependence, unspecified, uncomplicated: Secondary | ICD-10-CM

## 2013-03-12 DIAGNOSIS — M62838 Other muscle spasm: Secondary | ICD-10-CM

## 2013-03-12 DIAGNOSIS — G589 Mononeuropathy, unspecified: Secondary | ICD-10-CM

## 2013-03-12 DIAGNOSIS — Z1159 Encounter for screening for other viral diseases: Secondary | ICD-10-CM

## 2013-03-12 DIAGNOSIS — M7710 Lateral epicondylitis, unspecified elbow: Secondary | ICD-10-CM

## 2013-03-12 DIAGNOSIS — R209 Unspecified disturbances of skin sensation: Secondary | ICD-10-CM

## 2013-03-12 LAB — POCT CBC
Granulocyte percent: 67.7 %G (ref 37–80)
HCT, POC: 50.4 % (ref 43.5–53.7)
Lymph, poc: 2.1 (ref 0.6–3.4)
MCHC: 32.3 g/dL (ref 31.8–35.4)
MID (cbc): 0.6 (ref 0–0.9)
POC Granulocyte: 5.6 (ref 2–6.9)
POC LYMPH PERCENT: 25.6 %L (ref 10–50)
POC MID %: 6.7 %M (ref 0–12)
Platelet Count, POC: 213 10*3/uL (ref 142–424)
RDW, POC: 14 %

## 2013-03-12 LAB — GLUCOSE, POCT (MANUAL RESULT ENTRY): POC Glucose: 89 mg/dl (ref 70–99)

## 2013-03-12 MED ORDER — CYCLOBENZAPRINE HCL 10 MG PO TABS: ORAL_TABLET | ORAL | Status: DC

## 2013-03-12 MED ORDER — HYDROCODONE-ACETAMINOPHEN 5-325 MG PO TABS: 1.0000 | ORAL_TABLET | Freq: Four times a day (QID) | ORAL | Status: DC | PRN

## 2013-03-12 MED ORDER — MELOXICAM 15 MG PO TABS: 15.0000 mg | ORAL_TABLET | Freq: Every day | ORAL | Status: DC

## 2013-03-12 MED ORDER — GABAPENTIN 300 MG PO CAPS: ORAL_CAPSULE | ORAL | Status: DC

## 2013-03-12 NOTE — Progress Notes (Signed)
Xray read and patient discussed with Ms. Jeffery. Agree with assessment and plan of care per her note.   

## 2013-03-12 NOTE — Patient Instructions (Addendum)
Please cut back on your alcohol to no more than 2-3 drinks per day, but less is better. Continue to work on cutting back on your smoking as well.

## 2013-03-12 NOTE — Progress Notes (Signed)
Subjective:    Patient ID: Elijah Barton, male    DOB: [birth year], 57 y.o.   MRN: [id number]  HPI  This 57 y.o. male presents for evaluation of neuropathy.  Reports having "An awful lot of trouble with my feet burning and my RIGHT leg."  Numbness in the 5th toe of the RIGHT foot.  Worse with standing long periods. Eases off with rest, but doesn't resolve.  Worsens in the mornings after being up for about an hour. Initially diagnosed with peripheral neuropathy 03/17/2012 and started on neurontin, but the patient did not return for refills when he ran out several months later.  He did present with back pain and asked for refills of hydrocodone and cyclobenzaprine in 08/2012. Labs from 03/2012 and 05/2012 reviewed.  Elevated glucose but normal A1C.  Low normal vitamin B12 level.  CMET normal.  CBC revealed macrocytosis but normal hemoglobin.  Control of bladder and bowels, no saddle anesthesia.  Feels somewhat unsteady on his legs sometimes, but figures that's fatigue, given 60-70 hours of work each week, but also notes some weakness in the legs.  Also having back pain, across the shoulder blades.  Some days he's fine, some day's it's rough. Often triggered by bumping into something with that area, today he bumped the rear-view mirror with his RIGHT shoulder and triggered the pain.  Symptoms since summer 2013, last evaluation here for same 08/2012.  Has been seeing a chiropractor with some benefit.  Slept on the RIGHT arm last night and upon waking, the arm and 5th finger feel numb, that hasn't resolved yet today.  LEFT elbow pain.    Increased pain with flexion and rotation, also with grasping.  X a couple of months. No specific trauma or injury.  Review of Systems SOB, but I smoke too much, so I expect this to go along with it. No chest pain, HA, dizziness, vision change, N/V, diarrhea, constipation, dysuria, urinary urgency or frequency, or rash.     Objective:   Physical Exam Blood pressure  148/78, pulse 92, temperature 98.1 F (36.7 C), temperature source Oral, resp. rate 18, height 6' 0.5" (1.842 m), weight 292 lb 3.2 oz (132.541 kg), SpO2 97.00%. Body mass index is 39.06 kg/(m^2). Well-developed, well nourished obese WM who is awake, alert and oriented, in NAD. HEENT: [place]/AT, PERRL, EOMI.  Sclera and conjunctiva are clear.  EAC are patent, TMs are normal in appearance. Nasal mucosa is pink and moist. OP is clear. Neck: supple, non-tender, no lymphadenopathy, thyromegaly. Heart: RRR, no murmur Lungs: normal effort, CTA Back: Non-tender on examination today. Extremities: no cyanosis, clubbing or edema. Sensation to monofilament testing is decreased in the RIGHT toes, but intact elsewhere. Skin: warm and dry. Toenails are hypertrophic and long, consistent with onychomycosis. Psychologic: good mood and appropriate affect, normal speech and behavior.  Results for orders placed in visit on 03/12/13  POCT CBC      Result Value Range   WBC 8.3  4.6 - 10.2 K/uL   Lymph, poc 2.1  0.6 - 3.4   POC LYMPH PERCENT 25.6  10 - 50 %L   MID (cbc) 0.6  0 - 0.9   POC MID % 6.7  0 - 12 %M   POC Granulocyte 5.6  2 - 6.9   Granulocyte percent 67.7  37 - 80 %G   RBC 4.86  4.69 - 6.13 M/uL   Hemoglobin 16.3  14.1 - 18.1 g/dL   HCT, POC 40.9  81.1 - 53.7 %  MCV 103.8 (*) 80 - 97 fL   MCH, POC 33.5 (*) 27 - 31.2 pg   MCHC 32.3  31.8 - 35.4 g/dL   RDW, POC 96.0     Platelet Count, POC 213  142 - 424 K/uL   MPV 9.2  0 - 99.8 fL  GLUCOSE, POCT (MANUAL RESULT ENTRY)      Result Value Range   POC Glucose 89  70 - 99 mg/dl   CSPINE: [facility] reading (PRIMARY) by  Dr. [name]. Anterior spurring at C2, C4 and C5.  Spondylosis and disc space narrowing at C5-6.   LSSPINE: [facility] reading (PRIMARY) by  Dr. [name]. Generalized degenerative changes, anterior spurring at L5, loss of lordosis      Assessment & Plan:  Peripheral neuropathy - Plan: TSH, Vitamin B12, DG Lumbar Spine Complete, gabapentin  (NEURONTIN) 300 MG capsule  Back pain - Plan: cyclobenzaprine (FLEXERIL) 10 MG tablet, meloxicam (MOBIC) 15 MG tablet, HYDROcodone-acetaminophen (NORCO) 5-325 MG per tablet  Paresthesias/numbness - Plan: POCT CBC, POCT glucose (manual entry), Comprehensive metabolic panel, TSH, Vitamin B12, DG Cervical Spine Complete, DG Lumbar Spine Complete  Macrocytosis without anemia  Lateral epicondylitis  of elbow - Plan: meloxicam (MOBIC) 15 MG tablet  Tobacco abuse  Screening for prostate cancer - Plan: PSA  Need for hepatitis C screening test - Plan: Hepatitis C antibody  Patient Instructions  Please cut back on your alcohol to no more than 2-3 drinks per day, but less is better. Continue to work on cutting back on your smoking as well.   RTC 2-3 months for re-evaluation and CPE with fasting labs, sooner if symptoms worsen/persist.  Fernande Bras, PA-C Physician Assistant-Certified [facility]

## 2013-03-13 LAB — COMPREHENSIVE METABOLIC PANEL
ALT: 45 U/L (ref 0–53)
Alkaline Phosphatase: 70 U/L (ref 39–117)
CO2: 30 mEq/L (ref 19–32)
Creat: 1.03 mg/dL (ref 0.50–1.35)
Glucose, Bld: 108 mg/dL — ABNORMAL HIGH (ref 70–99)
Sodium: 142 mEq/L (ref 135–145)
Total Bilirubin: 0.8 mg/dL (ref 0.3–1.2)

## 2013-03-13 LAB — VITAMIN B12: Vitamin B-12: 277 pg/mL (ref 211–911)

## 2013-03-13 LAB — TSH: TSH: 6.301 u[IU]/mL — ABNORMAL HIGH (ref 0.350–4.500)

## 2013-03-13 LAB — PSA: PSA: 0.7 ng/mL (ref <=4.00)

## 2013-07-19 ENCOUNTER — Ambulatory Visit (INDEPENDENT_AMBULATORY_CARE_PROVIDER_SITE_OTHER): Payer: PRIVATE HEALTH INSURANCE | Admitting: Family Medicine

## 2013-07-19 VITALS — BP 140/90 | HR 96 | Temp 99.2°F | Resp 18 | Ht 62.5 in | Wt 295.0 lb

## 2013-07-19 DIAGNOSIS — K121 Other forms of stomatitis: Secondary | ICD-10-CM

## 2013-07-19 DIAGNOSIS — M549 Dorsalgia, unspecified: Secondary | ICD-10-CM

## 2013-07-19 DIAGNOSIS — K1370 Unspecified lesions of oral mucosa: Secondary | ICD-10-CM

## 2013-07-19 DIAGNOSIS — J029 Acute pharyngitis, unspecified: Secondary | ICD-10-CM

## 2013-07-19 DIAGNOSIS — G8929 Other chronic pain: Secondary | ICD-10-CM

## 2013-07-19 LAB — POCT RAPID STREP A (OFFICE): Rapid Strep A Screen: NEGATIVE

## 2013-07-19 MED ORDER — METAXALONE 800 MG PO TABS: ORAL_TABLET | ORAL | Status: DC

## 2013-07-19 MED ORDER — HYDROCODONE-ACETAMINOPHEN 5-325 MG PO TABS: 1.0000 | ORAL_TABLET | Freq: Four times a day (QID) | ORAL | Status: DC | PRN

## 2013-07-19 MED ORDER — TRIAMCINOLONE ACETONIDE 0.1 % MT PSTE: PASTE | Freq: Three times a day (TID) | OROMUCOSAL | Status: DC

## 2013-07-19 NOTE — Patient Instructions (Addendum)
Get massage.  If not better we will refer to PT  Take the Skelaxin twice daily as needed for muscle relaxants for back  Can take Aleve 2 tablets twice daily maximum for pain and inflammation  If ulcer on her tongue does not improve over the next 10 days, you will require to be referred to an oral surgeon  Scheduling a complete physical sometime later this fall with Theora Gianotti PA

## 2013-07-19 NOTE — Progress Notes (Signed)
Subjective: Patient has been having a sore throat for last couple of days. He is using lozenges. This morning he noticed an ulcer under his tongue and that concerns him. He is a heavy smoker, 2-3 packs a day for many years. He works 14 hours a day. He has a lot of trouble with tightness of his upper back. He wanted something else for that.  Objective: His TMs are normal. Throat erythematous. Strep screen was taken. Neck supple with no nodes. He has a large aphthous ulcer underneath his tongue on the midline. Does not feel hard to palpation. No nodes were appreciated the neck. Chest is clear. Heart regular without murmurs.     Results for orders placed in visit on 07/19/13  POCT RAPID STREP A (OFFICE)      Result Value Range   Rapid Strep A Screen Negative  Negative   Assessment: Pharyngitis Oral ulceration Tobacco abuse Upper back pain, chronic  Plan: Skelaxin for his back Symptomatic treatment for the throat Kenalog in [place] for the sore under his tongue. If it persists he needs to get back to [place] in 10 days to 2 weeks and we'll make a referral to an oral surgeon for a biopsy He wants to get a massage for his back which is a good idea. If relief is only transient suggested he do physical therapy for his back. We can make a referral to the side.  Return for physical this fall.

## 2013-07-21 LAB — CULTURE, GROUP A STREP: Organism ID, Bacteria: NORMAL

## 2013-07-23 ENCOUNTER — Ambulatory Visit: Payer: PRIVATE HEALTH INSURANCE

## 2013-07-23 ENCOUNTER — Ambulatory Visit (INDEPENDENT_AMBULATORY_CARE_PROVIDER_SITE_OTHER): Payer: PRIVATE HEALTH INSURANCE | Admitting: Family Medicine

## 2013-07-23 VITALS — BP 134/88 | HR 86 | Temp 98.6°F | Resp 20 | Ht 72.5 in | Wt 295.2 lb

## 2013-07-23 DIAGNOSIS — R059 Cough, unspecified: Secondary | ICD-10-CM

## 2013-07-23 DIAGNOSIS — R05 Cough: Secondary | ICD-10-CM

## 2013-07-23 DIAGNOSIS — J189 Pneumonia, unspecified organism: Secondary | ICD-10-CM

## 2013-07-23 MED ORDER — LEVOFLOXACIN 500 MG PO TABS: 500.0000 mg | ORAL_TABLET | Freq: Every day | ORAL | Status: DC

## 2013-07-23 MED ORDER — PREDNISONE 20 MG PO TABS: ORAL_TABLET | ORAL | Status: DC

## 2013-07-23 NOTE — Patient Instructions (Addendum)

## 2013-07-23 NOTE — Progress Notes (Addendum)
Subjective:   Patient ID: Elijah Barton, male    DOB: Mar 07, 1956, 57 y.o.   MRN: [id number] HPI Review of Systems Objective:   Physical Exam Assessment & Plan:   @[facility]FCLOGO @  Patient ID: Elijah Barton MRN: [id number], DOB: 03-12-1956, 57 y.o. Date of Encounter: 07/23/2013, 10:10 AM This chart was scribed for Elvina Sidle, MD by Valera Castle, ED Scribe. This patient was seen in room 14 and the patient's care was started at 10:10 AM.   Primary Physician: Tally Due, MD  Chief Complaint: Follow Up  HPI: 57 y.o. year old male with history below presents to the [facility] for a follow up. with history below presents to the [facility] for a follow up. He states that he was seen here before with sore throat, sweats, cough, possible fever. He states they did a strep test when he was here before. He now reports associated chills and body aches, still with a persistent, productive cough. He states that his temperature has come down since his last visit, with a max temperature of 98.6. He reports a decreased appetite, and difficulty sleeping. He denies emesis, and any other associated symptoms. He is a smoker. He requests a note for work.   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  He wants just one day off the job as he generally works a 60 hour week.  Past Medical History  Diagnosis Date  . Joint pain     all over except for hip  . Back pain   . Peripheral neuropathy      Home Meds: Prior to Admission medications   Medication Sig Start Date End Date Taking? Authorizing Provider  aspirin 81 MG tablet Take 81 mg by mouth daily.     Yes Historical Provider, MD  b complex vitamins capsule Take 1 capsule by mouth daily.   Yes Historical Provider, MD  cyclobenzaprine (FLEXERIL) 10 MG tablet Take one tablet at night as needed for muscle relaxant 03/12/13  Yes Chelle S Jeffery, PA-C  fish oil-omega-3 fatty acids 1000 MG capsule Take 1 g by mouth daily.     Yes Historical Provider, MD  gabapentin (NEURONTIN) 300 MG capsule Start at one  capsule at night for the first week and then increase to one in the morning and one at night. 03/12/13  Yes Chelle S Jeffery, PA-C  HYDROcodone-acetaminophen (NORCO) 5-325 MG per tablet Take 1 tablet by mouth every 6 (six) hours as needed for pain. 07/19/13  Yes Peyton Najjar, MD  magnesium 30 MG tablet Take 30 mg by mouth daily.     Yes Historical Provider, MD  meloxicam (MOBIC) 15 MG tablet Take 1 tablet (15 mg total) by mouth daily. 03/12/13  Yes Chelle S Jeffery, PA-C  metaxalone (SKELAXIN) 800 MG tablet Take one twice daily if needed for muscle relaxant 07/19/13  Yes Peyton Najjar, MD  Multiple Vitamin (MULTIVITAMIN) tablet Take 1 tablet by mouth daily.     Yes Historical Provider, MD  polycarbophil (FIBERCON) 625 MG tablet Take 625 mg by mouth daily.   Yes Historical Provider, MD  Probiotic Product (PROBIOTIC FORMULA PO) Take by mouth daily.     Yes Historical Provider, MD  triamcinolone (KENALOG) 0.1 % paste Place onto teeth 3 (three) times daily. 07/19/13  Yes Peyton Najjar, MD    Allergies:  Allergies  Allergen Reactions  . Penicillins     History   Social History  . Marital Status: Divorced    Spouse Name: n/a    Number of Children: 0  .  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 Smoking status: Current Every Day Smoker -- 2.00 packs/day for 40 years    Types: Cigarettes  . Smokeless tobacco: Never Used  . Alcohol Use: 7.5 oz/week    15 drink(s) per week     Comment: 0-20 drinks/week.  . Drug Use: No  . Sexual Activity: Yes    Birth Control/ Protection: Condom   Other Topics Concern  . Not on file   Social History Narrative   Lives alone with his 2 cats. Mother died in 20-Mar-2013 in [place], [place] in her home with Hospice.  Daughter lives in [place], [place]     Review of Systems:  Constitutional: negative for weight changes, or fatigue Positive for chills, sweats, fever HEENT: negative  for vision changes, hearing loss, congestion, rhinorrhea, ST, epistaxis, or sinus pressure Cardiovascular: negative for chest pain or palpitations Respiratory: negative for hemoptysis, wheezing, shortness of breath Positive for sore throat, cough Abdominal: negative for abdominal pain, nausea, vomiting, diarrhea, or constipation Dermatological: negative for rash Neurologic: negative for headache, dizziness, or syncope All other systems reviewed and are otherwise negative with the exception to those above and in the HPI.   Physical Exam: Blood pressure 134/88, pulse 86, temperature 98.6 F (37 C), temperature source Oral, resp. rate 20, height 6' 0.5" (1.842 m), weight 295 lb 3.2 oz (133.902 kg), SpO2 93.00%., Body mass index is 39.46 kg/(m^2). General: Well developed, well nourished, in no acute distress. Head: Normocephalic, atraumatic, eyes without discharge, sclera non-icteric, nares are without discharge. Bilateral auditory canals clear, TM's are without perforation, pearly grey and translucent with reflective cone of light bilaterally. Oral cavity moist, posterior pharynx without exudate, erythema, peritonsillar abscess, or post nasal drip.  Neck: Supple. No thyromegaly. Full ROM. No lymphadenopathy. Lungs: Clear bilaterally to auscultation with scattered wheezes, rales. Breathing is unlabored. Barrel shaped chest. Heart: RRR with S1 S2. No murmurs, rubs, or gallops appreciated. Abdomen: Soft, non-tender, non-distended with normoactive bowel sounds. No hepatomegaly. No rebound/guarding. No obvious abdominal masses. Msk:  Strength and tone normal for age. Extremities/Skin: Warm and dry. No clubbing or cyanosis. No edema. No rashes or suspicious lesions. Neuro: Alert and oriented X 3. Moves all extremities spontaneously. Gait is normal. CNII-XII grossly in tact. Psych:  Responds to questions appropriately with a normal affect.   Labs: [facility] reading (PRIMARY) by  Dr. Milus Glazier:  CXR  streaky lower lobe density.   ASSESSMENT AND PLAN:  57 y.o. year old male with early pneumonia Pneumonia - Plan: levofloxacin (LEVAQUIN) 500 MG tablet, predniSONE (DELTASONE) 20 MG tablet  Cough - Plan: DG Chest 2 View OOW today   Signed, Elvina Sidle, MD 07/23/2013 10:09 AM

## 2013-07-29 ENCOUNTER — Telehealth: Payer: Self-pay

## 2013-07-29 NOTE — Telephone Encounter (Signed)
Pt called wanting a RF of his abx. Pt feeling much better but worried because he is still coughing that it would come back. Advised rest and plenty of fluids (becuase he's been working) and to give it a few more days and to let us know if he gets worse, or doesn't get any better

## 2013-08-03 ENCOUNTER — Ambulatory Visit (INDEPENDENT_AMBULATORY_CARE_PROVIDER_SITE_OTHER): Payer: PRIVATE HEALTH INSURANCE | Admitting: Family Medicine

## 2013-08-03 ENCOUNTER — Ambulatory Visit: Payer: PRIVATE HEALTH INSURANCE

## 2013-08-03 VITALS — BP 148/88 | HR 82 | Temp 98.9°F | Resp 18 | Ht 73.0 in | Wt 301.4 lb

## 2013-08-03 DIAGNOSIS — M79674 Pain in right toe(s): Secondary | ICD-10-CM

## 2013-08-03 DIAGNOSIS — M79609 Pain in unspecified limb: Secondary | ICD-10-CM

## 2013-08-03 NOTE — Progress Notes (Signed)
  Subjective:    Patient ID: Elijah Barton, male    DOB: 10-03-1956, 57 y.o.   MRN: [id number]  Foot Pain Associated symptoms include joint swelling (right pinky toe). Pertinent negatives include no numbness or weakness.   57 year old male presents for evaluation of right pinky toe pain after jamming against a table while playing with his cats.  Injury occurred about 1 hour ago and he came straight here for evaluation. Wants to make sure it is not broken.  Is able to move his toe but does have pain while doing this. No numbness or tingling.  He is also concerned about toenail fungus that he has had for "years."  Is not bothersome other than that he has a hard time cutting his nails. No prior treatment for this.  Patient is otherwise doing well with no other concerns today.      Review of Systems  Musculoskeletal: Positive for joint swelling (right pinky toe).  Skin: Positive for color change (toenails, thickened, yellow).  Neurological: Negative for weakness and numbness.       Objective:   Physical Exam  Constitutional: He is oriented to person, place, and time. He appears well-developed and well-nourished.  HENT:  Head: Normocephalic and atraumatic.  Right Ear: External ear normal.  Left Ear: External ear normal.  Eyes: Conjunctivae are normal.  Neck: Normal range of motion.  Cardiovascular: Normal rate.   Pulmonary/Chest: Effort normal.  Musculoskeletal:  Right 5th toe pain to palpation on lateral sides of interphalangeal joint. No significant swelling. Full ROM.  No ecchymosis or erythema.   Neurological: He is alert and oriented to person, place, and time.  Psychiatric: He has a normal mood and affect. His behavior is normal. Judgment and thought content normal.      [facility] reading (PRIMARY) by  Dr. Patsy Lager as no acute bony abnormality.      Assessment & Plan:  Toe pain, right - Plan: DG Foot Complete Right  Will wait for over-read. Recommend buddy tape but patient  declines. Pain is tolerable. He will wear supportive shoes and take ibuprofen or tylenol as needed for pain. Will return in symptoms worsen or fail to improve

## 2013-08-17 ENCOUNTER — Telehealth: Payer: Self-pay | Admitting: *Deleted

## 2013-08-17 ENCOUNTER — Ambulatory Visit (INDEPENDENT_AMBULATORY_CARE_PROVIDER_SITE_OTHER): Payer: PRIVATE HEALTH INSURANCE | Admitting: Family Medicine

## 2013-08-17 ENCOUNTER — Ambulatory Visit (HOSPITAL_COMMUNITY)
Admission: RE | Admit: 2013-08-17 | Discharge: 2013-08-17 | Disposition: A | Discharge status: Home / Self Care | Payer: No Typology Code available for payment source | Source: Ambulatory Visit | Attending: Family Medicine | Admitting: Family Medicine

## 2013-08-17 VITALS — BP 148/84 | HR 80 | Temp 98.3°F | Resp 18 | Ht 73.0 in | Wt 298.0 lb

## 2013-08-17 DIAGNOSIS — D7589 Other specified diseases of blood and blood-forming organs: Secondary | ICD-10-CM

## 2013-08-17 DIAGNOSIS — M79661 Pain in right lower leg: Secondary | ICD-10-CM

## 2013-08-17 DIAGNOSIS — M7989 Other specified soft tissue disorders: Secondary | ICD-10-CM

## 2013-08-17 DIAGNOSIS — G519 Disorder of facial nerve, unspecified: Secondary | ICD-10-CM

## 2013-08-17 DIAGNOSIS — M79609 Pain in unspecified limb: Secondary | ICD-10-CM

## 2013-08-17 DIAGNOSIS — G629 Polyneuropathy, unspecified: Secondary | ICD-10-CM

## 2013-08-17 DIAGNOSIS — G589 Mononeuropathy, unspecified: Secondary | ICD-10-CM

## 2013-08-17 LAB — COMPREHENSIVE METABOLIC PANEL
ALT: 66 U/L — ABNORMAL HIGH (ref 0–53)
AST: 51 U/L — ABNORMAL HIGH (ref 0–37)
Albumin: 4.3 g/dL (ref 3.5–5.2)
Alkaline Phosphatase: 72 U/L (ref 39–117)
BUN: 10 mg/dL (ref 6–23)
Calcium: 9.9 mg/dL (ref 8.4–10.5)
Chloride: 104 mEq/L (ref 96–112)
Creat: 0.91 mg/dL (ref 0.50–1.35)
Total Bilirubin: 0.8 mg/dL (ref 0.3–1.2)

## 2013-08-17 LAB — POCT CBC
Lymph, poc: 1.9 (ref 0.6–3.4)
MCH, POC: 34.3 pg — AB (ref 27–31.2)
MCHC: 32.4 g/dL (ref 31.8–35.4)
MCV: 105.8 fL — AB (ref 80–97)
MID (cbc): 0.3 (ref 0–0.9)
MPV: 9.5 fL (ref 0–99.8)
POC LYMPH PERCENT: 32.2 %L (ref 10–50)
POC MID %: 5.7 %M (ref 0–12)
Platelet Count, POC: 205 10*3/uL (ref 142–424)
RBC: 4.64 M/uL — AB (ref 4.69–6.13)
RDW, POC: 13.1 %
WBC: 6 10*3/uL (ref 4.6–10.2)

## 2013-08-17 MED ORDER — GABAPENTIN 300 MG PO CAPS: ORAL_CAPSULE | ORAL | Status: DC

## 2013-08-17 NOTE — Telephone Encounter (Signed)
Pt notified that doppler was negative per Dr. Alwyn Ren.

## 2013-08-17 NOTE — Patient Instructions (Addendum)
Call [facility] for evaluation: [email or web address]  We will make the neurology referral.  Return at any time if symptoms are worse.  Go to [facility] ER to get the doppler ultrasound of right calf.

## 2013-08-17 NOTE — Progress Notes (Signed)
VASCULAR LAB PRELIMINARY  PRELIMINARY  PRELIMINARY  PRELIMINARY  Right lower extremity venous Doppler completed.    Preliminary report:  There is no DVT or SVT noted in the right lower extremity.  Bernette Seeman, RVT 08/17/2013, 4:16 PM

## 2013-08-17 NOTE — Progress Notes (Signed)
  Subjective: Patient is here for several things. He's been having problems with a burning and numbness in his right thigh. He has peripheral neuropathy which has been bothering him more. He has pain in his right calf and is worried about the risk of a blood clot.  His biggest problem is that he admits that he is an alcoholic. Has been drinking about a titer of vodka every night. He wants help. He says he is just self-medicating himself.  He is also concerned about his enlarging red blood cells.  Objective: Obese male in no acute distress. His straight leg raise test is negative. He has grossly normal sensation in his thigh. Calf is nontender. He has little tenderness medial leg just below the patella on the right. Homan negative. Mild edema of both ankles   Results for orders placed in visit on 08/17/13  POCT CBC      Result Value Range   WBC 6.0  4.6 - 10.2 K/uL   Lymph, poc 1.9  0.6 - 3.4   POC LYMPH PERCENT 32.2  10 - 50 %L   MID (cbc) 0.3  0 - 0.9   POC MID % 5.7  0 - 12 %M   POC Granulocyte 3.7  2 - 6.9   Granulocyte percent 62.1  37 - 80 %G   RBC 4.64 (*) 4.69 - 6.13 M/uL   Hemoglobin 15.9  14.1 - 18.1 g/dL   HCT, POC 62.9  52.8 - 53.7 %   MCV 105.8 (*) 80 - 97 fL   MCH, POC 34.3 (*) 27 - 31.2 pg   MCHC 32.4  31.8 - 35.4 g/dL   RDW, POC 41.3     Platelet Count, POC 205  142 - 424 K/uL   MPV 9.5  0 - 99.8 fL   Assessment: Peripheral neuropathy Right medial calf pain, rule out DVT Alcoholism Macrocytosis  Plan: Check B12, folate, chemistries, CBC Ultrasound Doppler of leg Refer to neurology Recommended [facility] for detox and alcoholism

## 2013-08-19 ENCOUNTER — Encounter: Payer: Self-pay | Admitting: Family Medicine

## 2013-08-20 ENCOUNTER — Encounter: Payer: Self-pay | Admitting: Neurology

## 2013-08-20 ENCOUNTER — Ambulatory Visit (INDEPENDENT_AMBULATORY_CARE_PROVIDER_SITE_OTHER): Payer: PRIVATE HEALTH INSURANCE | Admitting: Neurology

## 2013-08-20 VITALS — BP 157/90 | HR 86 | Temp 97.9°F | Ht 73.0 in | Wt 298.0 lb

## 2013-08-20 DIAGNOSIS — F1010 Alcohol abuse, uncomplicated: Secondary | ICD-10-CM

## 2013-08-20 DIAGNOSIS — G609 Hereditary and idiopathic neuropathy, unspecified: Secondary | ICD-10-CM

## 2013-08-20 DIAGNOSIS — E669 Obesity, unspecified: Secondary | ICD-10-CM

## 2013-08-20 DIAGNOSIS — F17200 Nicotine dependence, unspecified, uncomplicated: Secondary | ICD-10-CM

## 2013-08-20 NOTE — Patient Instructions (Addendum)
You may have a condition called peripheral neuropathy, i. e. nerve damage. There is no specific treatment for most neuropathies. The most common cause for neuropathy is diabetes in this country, in which case, tight glucose control is key. Other causes include thyroid disease, and some vitamin deficiencies. Certain medications such as chemotherapy agents and other chemicals or toxins including alcohol can cause neuropathy. There are some genetic conditions or hereditary neuropathies. Typically patients will report a family history of neuropathy in those conditions. There are cases associated with cancers and autoimmune conditions. Most neuropathies are progressive unless a root cause can be found and treated. For most neuropathies there is no actual cure or reversing of symptoms. Painful neuropathy can be difficult to treat symptomatically, but there are some medications available to ease the symptoms. Electrophysiologic testing with nerve conduction velocity studies and EMG (muscle testing) do not always pick up neuropathies that affect the smallest fibers. Other common tests include different type of blood work, and rarely, spinal fluid testing, and sometimes we resort to asking for a nerve and muscle biopsy.  I would like to investigate things further to look for evidence of neuropathy or nerve damage; therefore, I would like to do some blood work, and electrical testing of your muscles and nerves, which is known as EMG/NCV. Please reduce and stop your alcohol intake altogether. Your primary care physician has made a recommendation for outpatient rehabilitation.  You can continue taking the gabapentin 1 pill twice daily for now, we may increase to 3 times a day down the road.

## 2013-08-20 NOTE — Progress Notes (Signed)
Subjective:    Patient ID: Elijah Barton is a 57 y.o. male.  HPI  Elijah Foley, MD, PhD [facility] [address] [address] [place], [place] [address]  Dear Dr. Perrin Barton,   I saw your patient, Elijah Barton, upon your kind request in my neurologic clinic today for initial consultation of his neuropathy. The patient is unaccompanied today. As you know, Elijah Barton is a very pleasant 57 year old right-handed gentleman with an underlying medical history of alcoholism who has had problems with peripheral neuropathy for the past year. He has numbness and tingling and burning sensation in the legs up to the calves and in his hands, in the lateral aspect up to the elbow on the R and up the wrist on the L. He works full time and maintains fuel pumps. He recently started having R thigh numbness and tingling.  He was given a prescription for gabapentin, but did not take it consistently, but as Sx became worse, he started taking gabapentin 300 mg strength one twice daily. He reports improvement of his paresthesias and pain with gabapentin.  Of note, he drinks alcohol heavily and has done so since age 14. You have seen him on 08/17/13 and suggested outpatient rehab/detox, which he is willing to embark this. However, he is not ready to quit smoking and smokes about 2 ppd and has smoked for 41 years. His father was an alhoholic and died of liver failure at age 19. His brother drinks heavily. His mother lived to be 31, but had strokes, lung cancer, had a colostomy.  Recent blood work includes vitamin B12, CBC, CMP and folate. You also ordered a lower extremity Doppler test for lower extremity swelling. His blood work was done on 08/17/2013 showed normal B12 level albeit in the low-normal range, normal folate level, mildly increased AST and ALT and macrocytosis.    His Past Medical History Is Significant For: Past Medical History  Diagnosis Date  . Joint pain     all over except  for hip  . Back pain   . Peripheral neuropathy     His Past Surgical History Is Significant For: Past Surgical History  Procedure Laterality Date  . Lymph node biopsy      neck  . Tonsillectomy and adenoidectomy      His Family History Is Significant For: Family History  Problem Relation Age of Onset  . Colon cancer Mother   . Heart disease Mother   . Stroke Mother   . Cancer Mother     lung  . Cirrhosis Father   . Alcohol abuse Father     His Social History Is Significant For: History   Social History  . Marital Status: Divorced    Spouse Name: n/a    Number of Children: 0  . 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 Smoking status: Current Every Day Smoker -- 2.00 packs/day for 40 years    Types: Cigarettes  . Smokeless tobacco: Never Used  . Alcohol Use: 7.5 oz/week    15 drink(s) per week     Comment: 0-20 drinks/week.  . Drug Use: No  . Sexual Activity: Yes    Birth Control/ Protection: Condom   Other Topics Concern  . None   Social History Narrative   Lives alone with his 2 cats. Mother died in 03/17/13 in [place], [place] in her home  with Hospice.  Daughter lives in [place], [place]    His Allergies Are:  Allergies  Allergen Reactions  . Penicillins   :   His Current Medications Are:  Outpatient Encounter Prescriptions as of 08/20/2013  Medication Sig  . aspirin 81 MG tablet Take 81 mg by mouth daily.    Marland Kitchen b complex vitamins capsule Take 1 capsule by mouth daily.  . cyclobenzaprine (FLEXERIL) 10 MG tablet Take one tablet at night as needed for muscle relaxant  . fish oil-omega-3 fatty acids 1000 MG capsule Take 1 g by mouth daily.    Marland Kitchen gabapentin (NEURONTIN) 300 MG capsule Start at one capsule at night for the first week and then increase to one in the morning and one at night.  Marland Kitchen HYDROcodone-acetaminophen (NORCO) 5-325 MG per tablet Take 1 tablet by mouth every 6  (six) hours as needed for pain.  . magnesium 30 MG tablet Take 30 mg by mouth daily.    . Multiple Vitamin (MULTIVITAMIN) tablet Take 1 tablet by mouth daily.    . polycarbophil (FIBERCON) 625 MG tablet Take 625 mg by mouth daily.  . Probiotic Product (PROBIOTIC FORMULA PO) Take by mouth daily.    . meloxicam (MOBIC) 15 MG tablet Take 1 tablet (15 mg total) by mouth daily.  :   Review of Systems:  Out of a complete 14 point review of systems, all are reviewed and negative with the exception of these symptoms as listed below:  Review of Systems  Constitutional: Positive for fatigue.  HENT: Negative.   Eyes: Negative.   Respiratory: Positive for cough.   Cardiovascular: Negative.   Gastrointestinal: Negative.   Endocrine: Negative.   Genitourinary: Negative.   Musculoskeletal: Positive for arthralgias.  Skin: Negative.   Allergic/Immunologic: Negative.   Neurological: Positive for tremors and numbness.  Hematological: Negative.   Psychiatric/Behavioral: Positive for sleep disturbance.    Objective:  Neurologic Exam  Physical Exam Physical Examination:   Filed Vitals:   08/20/13 0825  BP: 157/90  Pulse: 86  Temp: 97.9 F (36.6 C)    General Examination: The patient is a very pleasant 57 y.o. male in no acute distress. He appears well-developed and well-nourished and adequately groomed. He is obese.  HEENT: Normocephalic, atraumatic, pupils are equal, round and reactive to light and accommodation. Funduscopic exam is normal with sharp disc margins noted. Extraocular tracking is good without limitation to gaze excursion or nystagmus noted. Normal smooth pursuit is noted. Hearing is grossly intact. Tympanic membranes: clear on the L and obscured by impacted cerumen on the R. Face is symmetric with normal facial animation and normal facial sensation. Speech is clear with no dysarthria noted. There is no hypophonia. There is no lip, jaw or voice tremor. Neck is supple with full  range of passive and active motion. There are no carotid bruits on auscultation. Oropharynx exam reveals: moderate mouth dryness, adequate dental hygiene and moderate airway crowding, due to narrow airway entry and thick soft palate. Mallampati is class III. Tongue protrudes centrally and palate elevates symmetrically. Tonsils are absent.    Chest: Clear to auscultation without wheezing, rhonchi or crackles noted.  Heart: S1+S2+0, regular and normal without murmurs, rubs or gallops noted.   Abdomen: Soft, non-tender and non-distended with normal bowel sounds appreciated on auscultation.  Extremities: There is 1+ pitting edema in the distal lower extremities bilaterally. Pedal pulses are intact.  Skin: Warm and dry without trophic changes noted. There are no varicose veins.  Musculoskeletal: exam reveals  no obvious joint deformities, tenderness or joint swelling or erythema.   Neurologically:  Mental status: The patient is awake, alert and oriented in all 4 spheres. His memory, attention, language and knowledge are appropriate. There is no aphasia, agnosia, apraxia or anomia. Speech is clear with normal prosody and enunciation. Thought process is linear. Mood is congruent and affect is blunted.  Cranial nerves are as described above under HEENT exam. In addition, shoulder shrug is normal with equal shoulder height noted. Motor exam: Normal bulk, strength and tone is noted. There is no drift, tremor or rebound. Romberg is negative for corrective steps but does show some mild swaying.. Reflexes are 1+ in the UEs, trace in the L knee, 1+ in the R knee and trace in the R ankle, absent in the L ankle. Toes are downgoing bilaterally. Fine motor skills are intact with normal finger taps, normal hand movements, normal rapid alternating patting, normal foot taps and normal foot agility.  Cerebellar testing shows no dysmetria or intention tremor on finger to nose testing. Heel to shin is unremarkable  bilaterally. There is no truncal or gait ataxia.  Sensory exam: There is decreased to pinprick, vibration, and temperature sense in both distal lower extremities up to the midcalf area and in the upper extremities up to the wrist area bilaterally, light touch seems to be preserved.  Gait, station and balance: He stands up with no significant difficulty but naturally stance is slightly wide-based. He walks without difficulty but does have problems performing tandem walk. He can stand on his toes but has trouble balancing on his heels. He turns en bloc.  Assessment and Plan:   In summary, KABLE HAYWOOD is a pleasant 57 y.o.-year old male with a history of obesity, alcoholism and current everyday heavy smoking, who has a Hx and physical exam consistent with peripheral neuropathy. I had a long chat with this patient regarding his symptoms and his presentation unlikely cause. I explained to him that in his case the most likely etiology of his neuropathy is alcohol related. He understands this and has already been advised by you to reduce and eventually eliminate his alcohol intake. Of note he also has a family history of alcoholism. He is advised to stop smoking as well but is currently not motivated to quit. He says that he is addicted to cigarettes. He understands that there is no specific treatment for most neuropathies. The most common cause for neuropathy is diabetes in this [place], in which case, tight glucose control is key. Other causes include thyroid disease, and he did have an elevated TSH some 6 months ago but never started the Synthroid that was recommended. He says that he never actually got a prescription for the medication. I suggested further workup including blood work and EMG and nerve conduction studies. I explained to him that for most neuropathies there is no actual cure or reversing of symptoms. Painful neuropathy can be difficult to treat symptomatically, but there are some medications  available to ease the symptoms. He has been given a prescription for gabapentin but has only recently started taking it regularly. I encouraged him to take it twice daily for now and down the Road we may increase it to 3 times a day. Electrophysiologic testing with nerve conduction velocity studies and EMG (muscle testing) do not always pick up neuropathies that affect the smallest fibers. Other tests include different type of spinal fluid testing, and sometimes we resort to asking for a nerve and muscle biopsy,  but for now we will just do blood work and EMG testing. He needed to go to work and was not able to stay to get his blood drawn and was not able to make a followup appointment. He indicated that he would be back to get his blood work done and he would make a followup appointment at the time. I suggested a three-month followup, sooner if the need arises. I also told him that we would be calling him for his blood test results and EMG and nerve conduction test results. He was in agreement. Thank you very much for allowing me to participate in the care of this nice patient. If I can be of any further assistance to you please do not hesitate to call me at [phone].  Sincerely,   Elijah Foley, MD, PhD

## 2013-09-02 ENCOUNTER — Ambulatory Visit: Payer: PRIVATE HEALTH INSURANCE

## 2013-09-02 ENCOUNTER — Ambulatory Visit (INDEPENDENT_AMBULATORY_CARE_PROVIDER_SITE_OTHER): Payer: PRIVATE HEALTH INSURANCE | Admitting: Internal Medicine

## 2013-09-02 VITALS — BP 140/74 | HR 85 | Temp 98.8°F | Resp 18 | Ht 71.0 in | Wt 307.0 lb

## 2013-09-02 DIAGNOSIS — F17200 Nicotine dependence, unspecified, uncomplicated: Secondary | ICD-10-CM

## 2013-09-02 DIAGNOSIS — R05 Cough: Secondary | ICD-10-CM

## 2013-09-02 DIAGNOSIS — J441 Chronic obstructive pulmonary disease with (acute) exacerbation: Secondary | ICD-10-CM

## 2013-09-02 DIAGNOSIS — Z6841 Body Mass Index (BMI) 40.0 and over, adult: Secondary | ICD-10-CM

## 2013-09-02 DIAGNOSIS — Z6837 Body mass index (BMI) 37.0-37.9, adult: Secondary | ICD-10-CM | POA: Insufficient documentation

## 2013-09-02 DIAGNOSIS — R0602 Shortness of breath: Secondary | ICD-10-CM

## 2013-09-02 DIAGNOSIS — R5381 Other malaise: Secondary | ICD-10-CM

## 2013-09-02 MED ORDER — FLUTICASONE-SALMETEROL 100-50 MCG/DOSE IN AEPB: 1.0000 | INHALATION_SPRAY | Freq: Two times a day (BID) | RESPIRATORY_TRACT | Status: DC

## 2013-09-02 MED ORDER — PREDNISONE 20 MG PO TABS: 20.0000 mg | ORAL_TABLET | Freq: Every day | ORAL | Status: DC

## 2013-09-02 MED ORDER — DOXYCYCLINE HYCLATE 100 MG PO TABS: 100.0000 mg | ORAL_TABLET | Freq: Two times a day (BID) | ORAL | Status: DC

## 2013-09-02 NOTE — Progress Notes (Addendum)
Subjective:    Patient ID: Elijah Barton, male    DOB: 04-04-1956, 57 y.o.   MRN: [id number] This chart was scribed for Ethelda Chick, MD by Clydene Laming, ED Scribe.  HPI HPI Comments: Elijah Barton is a 57 y.o. male who presents to the [facility] complaining of unresolved symptoms from recent chest infection 07/22/13. Coughing productive again Not sure if fever, feels warm No night sweats No daytime fatigue Sinuses discharge white/thick but no allergies known--has neuro treating him for neuropathy suspected 2 to etoh who is urging a sleep study. Trouble breathing all day Cough does not effect sleep kind've miserable right now doesn't want to get pneumonia again(last xray suggested bilat process) Wheezing 2 packs a day smoker for many yrs--not resolved to quit Tries to use vapor occas    Patient Active Problem List   Diagnosis Date Noted  . Peripheral neuropathy 03/12/2013   Past Medical History  Diagnosis Date  . Joint pain     all over except for hip  . Back pain   . Peripheral neuropathy    Past Surgical History  Procedure Laterality Date  . Lymph node biopsy      neck  . Tonsillectomy and adenoidectomy     Allergies  Allergen Reactions  . Penicillins    Prior to Admission medications   Medication Sig Start Date End Date Taking? Authorizing Provider  aspirin 81 MG tablet Take 81 mg by mouth daily.     Yes Historical Provider, MD  b complex vitamins capsule Take 1 capsule by mouth daily.   Yes Historical Provider, MD  fish oil-omega-3 fatty acids 1000 MG capsule Take 1 g by mouth daily.     Yes Historical Provider, MD  gabapentin (NEURONTIN) 300 MG capsule Start at one capsule at night for the first week and then increase to one in the morning and one at night. 08/17/13  Yes Peyton Najjar, MD  magnesium 30 MG tablet Take 30 mg by mouth daily.     Yes Historical Provider, MD  Multiple Vitamin (MULTIVITAMIN) tablet Take 1 tablet by mouth  daily.     Yes Historical Provider, MD  polycarbophil (FIBERCON) 625 MG tablet Take 625 mg by mouth daily.   Yes Historical Provider, MD  Probiotic Product (PROBIOTIC FORMULA PO) Take by mouth daily.     Yes Historical Provider, MD  cyclobenzaprine (FLEXERIL) 10 MG tablet Take one tablet at night as needed for muscle relaxant 03/12/13   Chelle S Jeffery, PA-C  HYDROcodone-acetaminophen (NORCO) 5-325 MG per tablet Take 1 tablet by mouth every 6 (six) hours as needed for pain. 07/19/13   Peyton Najjar, MD  meloxicam (MOBIC) 15 MG tablet Take 1 tablet (15 mg total) by mouth daily. 03/12/13   Fernande Bras, PA-C   History   Social History  . Marital Status: Divorced    Spouse Name: n/a    Number of Children: 0  . 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 Smoking status: Current Every Day Smoker -- 2.00 packs/day for 40 years    Types: Cigarettes  . Smokeless tobacco: Never Used  . Alcohol Use: 7.5 oz/week    15 drink(s) per week     Comment: 0-20 drinks/week.  . Drug Use: No  . Sexual Activity: Yes    Birth Control/ Protection: Condom   Other  Topics Concern  . Not on file   Social History Narrative   Lives alone with his 2 cats. Mother died in Mar 21, 2013 in [place], [place] in her home with Hospice.  Daughter lives in [place], [place]    Review of Systems  Constitutional: Negative for activity change and unexpected weight change.  Eyes: Negative for visual disturbance.  Respiratory: Positive for cough, chest tightness and shortness of breath. Negative for choking and stridor.   Cardiovascular: Negative for chest pain, palpitations and leg swelling.  Gastrointestinal: Negative for abdominal pain.       Objective:   Physical Exam  Constitutional: He is oriented to person, place, and time. He appears well-developed.  obese  HENT:  Right Ear: External ear normal.  Left Ear: External ear normal.   Mouth/Throat: Oropharynx is clear and moist.  Boggy turbs glistening  Eyes: Pupils are equal, round, and reactive to light.  Neck: No thyromegaly present.  Cardiovascular: Normal rate and regular rhythm.   No murmur heard. Pulmonary/Chest: Effort normal. No respiratory distress. He has wheezes. He has no rales.  Delay in expir w/ sl wheeze that exacerbates with cough//distant BS at bases  Lymphadenopathy:    He has no cervical adenopathy.  Neurological: He is alert and oriented to person, place, and time.         [facility] reading (PRIMARY) by  Dr.Cinzia Devos=no real change from 6 weeks ago  Chronic changes of lung and spine//suspect smoking related COPD   Assessment & Plan:  Cough -   COPD exacerbation - Plan: add Fluticasone-Salmeterol (ADVAIR) 100-50 MCG/DOSE AEPB for control COPD over next 3 months while attempting to reduce smoking  Other malaise and fatigue due to illness  Smoker - spent 7 min discussing PO2 95/obesity/2ppd leading to lung Ca and copd/emphysema---reviewed quitting options and we settled on reducing use to 1ppd by setting time interval then beginning 1 cig per week grad cessation  BMI 40.0-44.9, adult--?osa  Nicotine addiction  Meds ordered this encounter  Medications  . doxycycline (VIBRA-TABS) 100 MG tablet    Sig: Take 1 tablet (100 mg total) by mouth 2 (two) times daily.    Dispense:  20 tablet    Refill:  0  . predniSONE (DELTASONE) 20 MG tablet    Sig: Take 1 tablet (20 mg total) by mouth daily. 3/3/3/2/2/2/1/1/1 single daily dose for 9 days    Dispense:  18 tablet    Refill:  0  . Fluticasone-Salmeterol (ADVAIR) 100-50 MCG/DOSE AEPB    Sig: Inhale 1 puff into the lungs 2 (two) times daily.    Dispense:  1 each    Refill:  3    I have completed the patient encounter in its entirety as documented by the scribe, with editing by me where necessary. Jalal Rauch P. Merla Riches, M.D.

## 2014-02-12 ENCOUNTER — Telehealth: Payer: Self-pay | Admitting: *Deleted

## 2014-02-12 NOTE — Telephone Encounter (Signed)
Contact numbers are no longer valid. Pt is no longer employed at work number listed.  Emergency contact (daughter) has not talked to her father for several months and did not have a valid contact number.

## 2014-02-13 ENCOUNTER — Ambulatory Visit: Payer: PRIVATE HEALTH INSURANCE | Admitting: Neurology

## 2014-03-25 ENCOUNTER — Encounter: Payer: Self-pay | Admitting: Gastroenterology

## 2014-05-02 ENCOUNTER — Ambulatory Visit (INDEPENDENT_AMBULATORY_CARE_PROVIDER_SITE_OTHER): Payer: BC Managed Care – PPO | Admitting: Internal Medicine

## 2014-05-02 ENCOUNTER — Ambulatory Visit: Payer: BC Managed Care – PPO

## 2014-05-02 VITALS — BP 130/80 | HR 98 | Temp 99.0°F | Resp 16 | Ht 72.0 in | Wt 275.2 lb

## 2014-05-02 DIAGNOSIS — R0781 Pleurodynia: Secondary | ICD-10-CM

## 2014-05-02 DIAGNOSIS — R109 Unspecified abdominal pain: Secondary | ICD-10-CM

## 2014-05-02 DIAGNOSIS — R079 Chest pain, unspecified: Secondary | ICD-10-CM

## 2014-05-02 DIAGNOSIS — G609 Hereditary and idiopathic neuropathy, unspecified: Secondary | ICD-10-CM

## 2014-05-02 DIAGNOSIS — G629 Polyneuropathy, unspecified: Secondary | ICD-10-CM

## 2014-05-02 LAB — POCT UA - MICROSCOPIC ONLY
Bacteria, U Microscopic: NEGATIVE
CASTS, UR, LPF, POC: NEGATIVE
CRYSTALS, UR, HPF, POC: NEGATIVE
Mucus, UA: NEGATIVE
Yeast, UA: NEGATIVE

## 2014-05-02 LAB — POCT URINALYSIS DIPSTICK
Glucose, UA: NEGATIVE
Leukocytes, UA: NEGATIVE
NITRITE UA: NEGATIVE
PH UA: 5.5
Protein, UA: 30
RBC UA: NEGATIVE
SPEC GRAV UA: 1.025
UROBILINOGEN UA: 4

## 2014-05-02 LAB — POCT SEDIMENTATION RATE: POCT SED RATE: 10 mm/hr (ref 0–22)

## 2014-05-02 MED ORDER — CYCLOBENZAPRINE HCL 10 MG PO TABS: 10.0000 mg | ORAL_TABLET | Freq: Three times a day (TID) | ORAL | Status: DC | PRN

## 2014-05-02 MED ORDER — GABAPENTIN 300 MG PO CAPS: ORAL_CAPSULE | ORAL | Status: DC

## 2014-05-02 NOTE — Progress Notes (Signed)
   Subjective:    Patient ID: Elijah Barton, male    DOB: 1955/11/28, 58 y.o.   MRN: [id number]  HPI 58 y.o. Male presents to clinic today with right flank/back pain that radiates into his hip. Reports that he noticed this pain this morning. Denies any injury, or dysuria , trouble with bowels of blood in urine. Reports that he has not taken anything for pain. Reports that he had been treated for back pain in the past, but pain is not in the same area that it once was. Reports that pain has progressed throughout the day and states that depending on how he moves or bends that it may relieve it or be worse. States that some pain radiates around into the lower abdomen. States that it is very point tender over right posterior lower ribs over posterior axillary line. Reports some pain with coughing and denies any discomfort with deep breathing. Continues to smoke.  Fhx of kidney stones, he never had one.    Review of Systems     Objective:   Physical Exam  Constitutional: He is oriented to person, place, and time. He appears well-nourished. No distress.  HENT:  Head: Normocephalic.  Eyes: EOM are normal. Pupils are equal, round, and reactive to light. No scleral icterus.  Neck: Normal range of motion.  Pulmonary/Chest: Effort normal. No accessory muscle usage. Not tachypneic. No respiratory distress. He has decreased breath sounds. He has no wheezes. He has rhonchi. He has no rales.   He exhibits tenderness.  Ribs very tender this area.  Abdominal: There is tenderness. There is no rebound and no guarding.  Musculoskeletal: He exhibits tenderness.  Neurological: He is alert and oriented to person, place, and time. He exhibits normal muscle tone. Coordination normal.  Skin: No rash noted.  Psychiatric: He has a normal mood and affect. His behavior is normal.   [facility] reading (PRIMARY) by  Dr.Ailish Prospero no fx or mets seen  Results for orders placed in visit on 05/02/14  POCT URINALYSIS DIPSTICK        Result Value Ref Range   Color, UA dark yellow     Clarity, UA clear     Glucose, UA neg     Bilirubin, UA small     Ketones, UA trace     Spec Grav, UA 1.025     Blood, UA neg     pH, UA 5.5     Protein, UA 30     Urobilinogen, UA 4.0     Nitrite, UA neg     Leukocytes, UA Negative    POCT UA - MICROSCOPIC ONLY      Result Value Ref Range   WBC, Ur, HPF, POC 0-1     RBC, urine, microscopic 1-3     Bacteria, U Microscopic neg     Mucus, UA neg     Epithelial cells, urine per micros 0-1     Crystals, Ur, HPF, POC neg     Casts, Ur, LPF, POC neg     Yeast, UA neg            Assessment & Plan:  Rib pain/right flank pain---cause unclear watch out for shingles Quit smoking Flexeril 10mg Maree Krabbe

## 2014-05-02 NOTE — Patient Instructions (Addendum)
Chest Pain (Nonspecific) It is often hard to give a specific diagnosis for the cause of chest pain. There is always a chance that your pain could be related to something serious, such as a heart attack or a blood clot in the lungs. You need to follow up with your health care provider for further evaluation. CAUSES   Heartburn.  Pneumonia or bronchitis.  Anxiety or stress.  Inflammation around your heart (pericarditis) or lung (pleuritis or pleurisy).  A blood clot in the lung.  A collapsed lung (pneumothorax). It can develop suddenly on its own (spontaneous pneumothorax) or from trauma to the chest.  Shingles infection (herpes zoster virus). The chest wall is composed of bones, muscles, and cartilage. Any of these can be the source of the pain.  The bones can be bruised by injury.  The muscles or cartilage can be strained by coughing or overwork.  The cartilage can be affected by inflammation and become sore (costochondritis). DIAGNOSIS  Lab tests or other studies may be needed to find the cause of your pain. Your health care provider may have you take a test called an ambulatory electrocardiogram (ECG). An ECG records your heartbeat patterns over a 24-hour period. You may also have other tests, such as:  Transthoracic echocardiogram (TTE). During echocardiography, sound waves are used to evaluate how blood flows through your heart.  Transesophageal echocardiogram (TEE).  Cardiac monitoring. This allows your health care provider to monitor your heart rate and rhythm in real time.  Holter monitor. This is a portable device that records your heartbeat and can help diagnose heart arrhythmias. It allows your health care provider to track your heart activity for several days, if needed.  Stress tests by exercise or by giving medicine that makes the heart beat faster. TREATMENT   Treatment depends on what may be causing your chest pain. Treatment may include:  Acid blockers for  heartburn.  Anti-inflammatory medicine.  Pain medicine for inflammatory conditions.  Antibiotics if an infection is present.  You may be advised to change lifestyle habits. This includes stopping smoking and avoiding alcohol, caffeine, and chocolate.  You may be advised to keep your head raised (elevated) when sleeping. This reduces the chance of acid going backward from your stomach into your esophagus. Most of the time, nonspecific chest pain will improve within 2-3 days with rest and mild pain medicine.  HOME CARE INSTRUCTIONS   If antibiotics were prescribed, take them as directed. Finish them even if you start to feel better.  For the next few days, avoid physical activities that bring on chest pain. Continue physical activities as directed.  Do not use any tobacco products, including cigarettes, chewing tobacco, or electronic cigarettes.  Avoid drinking alcohol.  Only take medicine as directed by your health care provider.  Follow your health care provider's suggestions for further testing if your chest pain does not go away.  Keep any follow-up appointments you made. If you do not go to an appointment, you could develop lasting (chronic) problems with pain. If there is any problem keeping an appointment, call to reschedule. SEEK MEDICAL CARE IF:   Your chest pain does not go away, even after treatment.  You have a rash with blisters on your chest.  You have a fever. SEEK IMMEDIATE MEDICAL CARE IF:   You have increased chest pain or pain that spreads to your arm, neck, jaw, back, or abdomen.  You have shortness of breath.  You have an increasing cough, or you cough  up blood.  You have severe back or abdominal pain.  You feel nauseous or vomit.  You have severe weakness.  You faint.  You have chills. This is an emergency. Do not wait to see if the pain will go away. Get medical help at once. Call your local emergency services (911 in [place] Do not drive  yourself to the hospital. MAKE SURE YOU:   Understand these instructions.  Will watch your condition.  Will get help right away if you are not doing well or get worse. Document Released: 07/13/2005 Document Revised: 10/08/2013 Document Reviewed: 05/08/2008 [facility] Patient Information 2015 [place], [place]. This information is not intended to replace advice given to you by your health care provider. Make sure you discuss any questions you have with your health care provider. Back Pain, Adult Low back pain is very common. About 1 in 5 people have back pain.The cause of low back pain is rarely dangerous. The pain often gets better over time.About half of people with a sudden onset of back pain feel better in just 2 weeks. About 8 in 10 people feel better by 6 weeks.  CAUSES Some common causes of back pain include:  Strain of the muscles or ligaments supporting the spine.  Wear and tear (degeneration) of the spinal discs.  Arthritis.  Direct injury to the back. DIAGNOSIS Most of the time, the direct cause of low back pain is not known.However, back pain can be treated effectively even when the exact cause of the pain is unknown.Answering your caregiver's questions about your overall health and symptoms is one of the most accurate ways to make sure the cause of your pain is not dangerous. If your caregiver needs more information, he or she may order lab work or imaging tests (X-rays or MRIs).However, even if imaging tests show changes in your back, this usually does not require surgery. HOME CARE INSTRUCTIONS For many people, back pain returns.Since low back pain is rarely dangerous, it is often a condition that people can learn to [facility] their own.   Remain active. It is stressful on the back to sit or stand in one place. Do not sit, drive, or stand in one place for more than 30 minutes at a time. Take short walks on level surfaces as soon as pain allows.Try to increase the length of  time you walk each day.  Do not stay in bed.Resting more than 1 or 2 days can delay your recovery.  Do not avoid exercise or work.Your body is made to move.It is not dangerous to be active, even though your back may hurt.Your back will likely heal faster if you return to being active before your pain is gone.  Pay attention to your body when you bend and lift. Many people have less discomfortwhen lifting if they bend their knees, keep the load close to their bodies,and avoid twisting. Often, the most comfortable positions are those that put less stress on your recovering back.  Find a comfortable position to sleep. Use a firm mattress and lie on your side with your knees slightly bent. If you lie on your back, put a pillow under your knees.  Only take over-the-counter or prescription medicines as directed by your caregiver. Over-the-counter medicines to reduce pain and inflammation are often the most helpful.Your caregiver may prescribe muscle relaxant drugs.These medicines help dull your pain so you can more quickly return to your normal activities and healthy exercise.  Put ice on the injured area.  Put ice in  a plastic bag.  Place a towel between your skin and the bag.  Leave the ice on for 15-20 minutes, 03-04 times a day for the first 2 to 3 days. After that, ice and heat may be alternated to reduce pain and spasms.  Ask your caregiver about trying back exercises and gentle massage. This may be of some benefit.  Avoid feeling anxious or stressed.Stress increases muscle tension and can worsen back pain.It is important to recognize when you are anxious or stressed and learn ways to manage it.Exercise is a great option. SEEK MEDICAL CARE IF:  You have pain that is not relieved with rest or medicine.  You have pain that does not improve in 1 week.  You have new symptoms.  You are generally not feeling well. SEEK IMMEDIATE MEDICAL CARE IF:   You have pain that radiates  from your back into your legs.  You develop new bowel or bladder control problems.  You have unusual weakness or numbness in your arms or legs.  You develop nausea or vomiting.  You develop abdominal pain.  You feel faint. Document Released: 10/03/2005 Document Revised: 04/03/2012 Document Reviewed: 02/21/2011 [facility] Patient Information 2015 [place], [place]. This information is not intended to replace advice given to you by your health care provider. Make sure you discuss any questions you have with your health care provider. Smoking Cessation Quitting smoking is important to your health and has many advantages. However, it is not always easy to quit since nicotine is a very addictive drug. Often times, people try 3 times or more before being able to quit. This document explains the best ways for you to prepare to quit smoking. Quitting takes hard work and a lot of effort, but you can do it. ADVANTAGES OF QUITTING SMOKING  You will live longer, feel better, and live better.  Your body will feel the impact of quitting smoking almost immediately.  Within 20 minutes, blood pressure decreases. Your pulse returns to its normal level.  After 8 hours, carbon monoxide levels in the blood return to normal. Your oxygen level increases.  After 24 hours, the chance of having a heart attack starts to decrease. Your breath, hair, and body stop smelling like smoke.  After 48 hours, damaged nerve endings begin to recover. Your sense of taste and smell improve.  After 72 hours, the body is virtually free of nicotine. Your bronchial tubes relax and breathing becomes easier.  After 2 to 12 weeks, lungs can hold more air. Exercise becomes easier and circulation improves.  The risk of having a heart attack, stroke, cancer, or lung disease is greatly reduced.  After 1 year, the risk of coronary heart disease is cut in half.  After 5 years, the risk of stroke falls to the same as a  nonsmoker.  After 10 years, the risk of lung cancer is cut in half and the risk of other cancers decreases significantly.  After 15 years, the risk of coronary heart disease drops, usually to the level of a nonsmoker.  If you are pregnant, quitting smoking will improve your chances of having a healthy baby.  The people you live with, especially any children, will be healthier.  You will have extra money to spend on things other than cigarettes. QUESTIONS TO THINK ABOUT BEFORE ATTEMPTING TO QUIT You may want to talk about your answers with your caregiver.  Why do you want to quit?  If you tried to quit in the past, what helped and what  did not?  What will be the most difficult situations for you after you quit? How will you plan to handle them?  Who can help you through the tough times? Your family? Friends? A caregiver?  What pleasures do you get from smoking? What ways can you still get pleasure if you quit? Here are some questions to ask your caregiver:  How can you help me to be successful at quitting?  What medicine do you think would be best for me and how should I take it?  What should I do if I need more help?  What is smoking withdrawal like? How can I get information on withdrawal? GET READY  Set a quit date.  Change your environment by getting rid of all cigarettes, ashtrays, matches, and lighters in your home, car, or work. Do not let people smoke in your home.  Review your past attempts to quit. Think about what worked and what did not. GET SUPPORT AND ENCOURAGEMENT You have a better chance of being successful if you have help. You can get support in many ways.  Tell your family, friends, and co-workers that you are going to quit and need their support. Ask them not to smoke around you.  Get individual, group, or telephone counseling and support. Programs are available at [employer] and health centers. Call your local health department for information  about programs in your area.  Spiritual beliefs and practices may help some smokers quit.  Download a "quit meter" on your computer to keep track of quit statistics, such as how long you have gone without smoking, cigarettes not smoked, and money saved.  Get a self-help book about quitting smoking and staying off of tobacco. Nowthen yourself from urges to smoke. Talk to someone, go for a walk, or occupy your time with a task.  Change your normal routine. Take a different route to work. Drink tea instead of coffee. Eat breakfast in a different place.  Reduce your stress. Take a hot bath, exercise, or read a book.  Plan something enjoyable to do every day. Reward yourself for not smoking.  Explore interactive web-based programs that specialize in helping you quit. GET MEDICINE AND USE IT CORRECTLY Medicines can help you stop smoking and decrease the urge to smoke. Combining medicine with the above behavioral methods and support can greatly increase your chances of successfully quitting smoking.  Nicotine replacement therapy helps deliver nicotine to your body without the negative effects and risks of smoking. Nicotine replacement therapy includes nicotine gum, lozenges, inhalers, nasal sprays, and skin patches. Some may be available over-the-counter and others require a prescription.  Antidepressant medicine helps people abstain from smoking, but how this works is unknown. This medicine is available by prescription.  Nicotinic receptor partial agonist medicine simulates the effect of nicotine in your brain. This medicine is available by prescription. Ask your caregiver for advice about which medicines to use and how to use them based on your health history. Your caregiver will tell you what side effects to look out for if you choose to be on a medicine or therapy. Carefully read the information on the package. Do not use any other product containing nicotine  while using a nicotine replacement product.  RELAPSE OR DIFFICULT SITUATIONS Most relapses occur within the first 3 months after quitting. Do not be discouraged if you start smoking again. Remember, most people try several times before finally quitting. You may have symptoms of withdrawal because your  body is used to nicotine. You may crave cigarettes, be irritable, feel very hungry, cough often, get headaches, or have difficulty concentrating. The withdrawal symptoms are only temporary. They are strongest when you first quit, but they will go away within 10-14 days. To reduce the chances of relapse, try to:  Avoid drinking alcohol. Drinking lowers your chances of successfully quitting.  Reduce the amount of caffeine you consume. Once you quit smoking, the amount of caffeine in your body increases and can give you symptoms, such as a rapid heartbeat, sweating, and anxiety.  Avoid smokers because they can make you want to smoke.  Do not let weight gain distract you. Many smokers will gain weight when they quit, usually less than 10 pounds. Eat a healthy diet and stay active. You can always lose the weight gained after you quit.  Find ways to improve your mood other than smoking. FOR MORE INFORMATION  www.smokefree.gov  Document Released: 09/27/2001 Document Revised: 04/03/2012 Document Reviewed: 01/12/2012 [facility] Patient Information 2015 [place], [place]. This information is not intended to replace advice given to you by your health care provider. Make sure you discuss any questions you have with your health care provider.

## 2014-05-03 ENCOUNTER — Ambulatory Visit (HOSPITAL_COMMUNITY)
Admission: RE | Admit: 2014-05-03 | Discharge: 2014-05-03 | Disposition: A | Discharge status: Home / Self Care | Payer: Self-pay | Source: Ambulatory Visit | Attending: Family Medicine | Admitting: Family Medicine

## 2014-05-03 ENCOUNTER — Ambulatory Visit (INDEPENDENT_AMBULATORY_CARE_PROVIDER_SITE_OTHER): Payer: BC Managed Care – PPO | Admitting: Family Medicine

## 2014-05-03 ENCOUNTER — Telehealth: Payer: Self-pay | Admitting: Family Medicine

## 2014-05-03 VITALS — BP 146/82 | HR 88 | Temp 99.6°F | Resp 18 | Ht 72.25 in | Wt 276.0 lb

## 2014-05-03 DIAGNOSIS — R109 Unspecified abdominal pain: Secondary | ICD-10-CM

## 2014-05-03 DIAGNOSIS — M549 Dorsalgia, unspecified: Secondary | ICD-10-CM

## 2014-05-03 DIAGNOSIS — M5489 Other dorsalgia: Secondary | ICD-10-CM

## 2014-05-03 DIAGNOSIS — J441 Chronic obstructive pulmonary disease with (acute) exacerbation: Secondary | ICD-10-CM

## 2014-05-03 DIAGNOSIS — R1900 Intra-abdominal and pelvic swelling, mass and lump, unspecified site: Secondary | ICD-10-CM

## 2014-05-03 DIAGNOSIS — K7689 Other specified diseases of liver: Secondary | ICD-10-CM | POA: Insufficient documentation

## 2014-05-03 MED ORDER — IOHEXOL 300 MG/ML  SOLN
25.0000 mL | INTRAMUSCULAR | Status: AC
  Administered 2014-05-03: 25 mL via ORAL

## 2014-05-03 MED ORDER — HYDROCODONE-ACETAMINOPHEN 5-325 MG PO TABS: 1.0000 | ORAL_TABLET | Freq: Four times a day (QID) | ORAL | Status: DC | PRN

## 2014-05-03 MED ORDER — FLUTICASONE-SALMETEROL 100-50 MCG/DOSE IN AEPB: 1.0000 | INHALATION_SPRAY | Freq: Two times a day (BID) | RESPIRATORY_TRACT | Status: DC

## 2014-05-03 MED ORDER — IOHEXOL 300 MG/ML  SOLN
100.0000 mL | Freq: Once | INTRAMUSCULAR | Status: AC | PRN
  Administered 2014-05-03: 100 mL via INTRAVENOUS

## 2014-05-03 NOTE — Telephone Encounter (Signed)
CT scan results showing fatty liver discussed with patient.  He needs to reduce fat in diet and begin daily 20-30 min exercise regime.

## 2014-05-03 NOTE — Progress Notes (Signed)
Subjective:  This chart was scribed for Robyn Haber, MD by Donato Schultz, Medical Scribe. This patient was seen in Room 1 and the patient's care was started at 1:47 PM.   Patient ID: Elijah Barton, male    DOB: [birth year], 58 y.o.   MRN: [id number]  HPI HPI Comments: Elijah Barton is a 58 y.o. male with a history of back pain and joint pain who presents to the [facility] complaining of constant back pain radiating to his lower right ribs and right flank that started two days ago.  He states he came to [facility] yesterday and saw Dr. Elder Cyphers and was prescribed flexeril to treat his back pain.  He states that he had an x-ray and UA performed yesterday and states that there were no significant findings.  He states that his symptoms have not improved with the medication and feels as though something is "swelling" inside of him.  The patient is worried that there is some damage to his internal organs and he is terrified.  The patient states that movement aggravates the pain.  He denies feeling any burning on his skin, nausea, vomiting, SOB, difficulty urinating, or rash as associated symptoms.  He denies any recent injury.  He denies experiencing these symptoms in the past.  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    Past Medical History  Diagnosis Date  . Joint pain     all over except for hip  . Back pain   . Peripheral neuropathy    Past Surgical History  Procedure Laterality Date  . Lymph node biopsy      neck  . Tonsillectomy and adenoidectomy     Family History  Problem Relation Age of Onset  . Colon cancer Mother   . Heart disease Mother   . Stroke Mother   . Cancer Mother     lung  . Cirrhosis Father   . Alcohol abuse Father    History   Social History  . Marital Status: Divorced    Spouse Name: n/a    Number of Children: 0  . 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 Smoking status: Current Every Day Smoker -- 2.00 packs/day for 40 years    Types: Cigarettes  . Smokeless tobacco: Never Used  . Alcohol Use: 7.5 oz/week    15 drink(s) per week     Comment: 0-20 drinks/week.  . Drug Use: No  . Sexual Activity: Yes    Birth Control/ Protection: Condom   Other Topics Concern  . Not on file   Social History Narrative   Lives alone with his 2 cats. Mother died in Feb 16, 2013 in [place], [place] in her home with Hospice.  Daughter lives in [place], [place]   Allergies  Allergen Reactions  . Penicillins     Review of Systems  Respiratory: Negative for shortness of breath.   Gastrointestinal: Negative for nausea and vomiting.  Genitourinary: Positive for flank pain. Negative for decreased urine volume and difficulty urinating.  Musculoskeletal: Positive for arthralgias and back pain. Negative for gait problem, joint swelling, neck pain and neck stiffness.  Skin: Negative for rash.     Objective:  Physical Exam  Nursing note and vitals reviewed. Constitutional: He is oriented to person, place, and time. He appears well-developed and well-nourished.  HENT:  Head: Normocephalic and  atraumatic.  Eyes: EOM are normal.  Neck: Normal range of motion.  Cardiovascular: Normal rate.   Pulmonary/Chest: Effort normal.  Musculoskeletal: Normal range of motion.  Tender along his lower ribs.   Neurological: He is alert and oriented to person, place, and time.  Skin: Skin is warm and dry. No rash noted.  Psychiatric: He has a normal mood and affect. His behavior is normal.   Results for orders placed in visit on 05/02/14  POCT URINALYSIS DIPSTICK      Result Value Ref Range   Color, UA dark yellow     Clarity, UA clear     Glucose, UA neg     Bilirubin, UA small     Ketones, UA trace     Spec Grav, UA 1.025     Blood, UA neg     pH, UA 5.5     Protein, UA 30     Urobilinogen, UA 4.0     Nitrite, UA neg     Leukocytes, UA  Negative    POCT UA - MICROSCOPIC ONLY      Result Value Ref Range   WBC, Ur, HPF, POC 0-1     RBC, urine, microscopic 1-3     Bacteria, U Microscopic neg     Mucus, UA neg     Epithelial cells, urine per micros 0-1     Crystals, Ur, HPF, POC neg     Casts, Ur, LPF, POC neg     Yeast, UA neg    POCT SEDIMENTATION RATE      Result Value Ref Range   POCT SED RATE 10  0 - 22 mm/hr     BP 146/82  Pulse 88  Temp(Src) 99.6 F (37.6 C)  Resp 18  Ht 6' 0.25" (1.835 m)  Wt 276 lb (125.193 kg)  BMI 37.18 kg/m2  SpO2 93% Assessment & Plan:  Patient seems very anxious today. I'm not finding much in the way of abnormalities in his physical exam other than tenderness under his right left rib in the flank area. Nevertheless patient is convinced there is something wrong internally and needs some further reassurance.  COPD exacerbation - Plan: Fluticasone-Salmeterol (ADVAIR) 100-50 MCG/DOSE AEPB  Right-sided back pain, unspecified location - Plan: HYDROcodone-acetaminophen (NORCO) 5-325 MG per tablet, CANCELED: CT Abdomen Pelvis W Contrast  Right flank pain - Plan: CT Abdomen Pelvis W Contrast  Signed, Robyn Haber, MD    I personally performed the services described in this documentation, which was scribed in my presence. The recorded information has been reviewed and is accurate.

## 2014-10-12 ENCOUNTER — Ambulatory Visit (INDEPENDENT_AMBULATORY_CARE_PROVIDER_SITE_OTHER): Payer: BC Managed Care – PPO | Admitting: Emergency Medicine

## 2014-10-12 VITALS — BP 146/88 | HR 78 | Temp 98.5°F | Resp 18 | Ht 72.25 in | Wt 282.2 lb

## 2014-10-12 DIAGNOSIS — R0781 Pleurodynia: Secondary | ICD-10-CM

## 2014-10-12 DIAGNOSIS — F1010 Alcohol abuse, uncomplicated: Secondary | ICD-10-CM

## 2014-10-12 DIAGNOSIS — M549 Dorsalgia, unspecified: Secondary | ICD-10-CM

## 2014-10-12 DIAGNOSIS — R109 Unspecified abdominal pain: Secondary | ICD-10-CM

## 2014-10-12 DIAGNOSIS — G629 Polyneuropathy, unspecified: Secondary | ICD-10-CM

## 2014-10-12 DIAGNOSIS — Z23 Encounter for immunization: Secondary | ICD-10-CM

## 2014-10-12 DIAGNOSIS — J441 Chronic obstructive pulmonary disease with (acute) exacerbation: Secondary | ICD-10-CM

## 2014-10-12 LAB — POCT CBC
Granulocyte percent: 69.2 %G (ref 37–80)
HEMATOCRIT: 50.7 % (ref 43.5–53.7)
Hemoglobin: 17.1 g/dL (ref 14.1–18.1)
LYMPH, POC: 1.6 (ref 0.6–3.4)
MCH, POC: 34.5 pg — AB (ref 27–31.2)
MCHC: 33.7 g/dL (ref 31.8–35.4)
MCV: 102.4 fL — AB (ref 80–97)
MID (cbc): 0.3 (ref 0–0.9)
MPV: 7.4 fL (ref 0–99.8)
POC Granulocyte: 4.3 (ref 2–6.9)
POC LYMPH %: 25.6 % (ref 10–50)
POC MID %: 5.2 %M (ref 0–12)
Platelet Count, POC: 180 10*3/uL (ref 142–424)
RBC: 4.95 M/uL (ref 4.69–6.13)
RDW, POC: 13.3 %
WBC: 6.2 10*3/uL (ref 4.6–10.2)

## 2014-10-12 LAB — COMPREHENSIVE METABOLIC PANEL
ALT: 29 U/L (ref 0–53)
AST: 32 U/L (ref 0–37)
Albumin: 3.9 g/dL (ref 3.5–5.2)
Alkaline Phosphatase: 64 U/L (ref 39–117)
BUN: 8 mg/dL (ref 6–23)
CALCIUM: 9.8 mg/dL (ref 8.4–10.5)
CHLORIDE: 104 meq/L (ref 96–112)
CO2: 31 mEq/L (ref 19–32)
CREATININE: 0.85 mg/dL (ref 0.50–1.35)
Glucose, Bld: 113 mg/dL — ABNORMAL HIGH (ref 70–99)
POTASSIUM: 5.8 meq/L — AB (ref 3.5–5.3)
SODIUM: 142 meq/L (ref 135–145)
Total Bilirubin: 0.9 mg/dL (ref 0.2–1.2)
Total Protein: 6.8 g/dL (ref 6.0–8.3)

## 2014-10-12 LAB — POCT UA - MICROSCOPIC ONLY
BACTERIA, U MICROSCOPIC: NEGATIVE
Casts, Ur, LPF, POC: NEGATIVE
Crystals, Ur, HPF, POC: NEGATIVE
Epithelial cells, urine per micros: NEGATIVE
MUCUS UA: NEGATIVE
Yeast, UA: NEGATIVE

## 2014-10-12 LAB — POCT URINALYSIS DIPSTICK
Blood, UA: NEGATIVE
GLUCOSE UA: NEGATIVE
Ketones, UA: NEGATIVE
Leukocytes, UA: NEGATIVE
Nitrite, UA: NEGATIVE
PH UA: 6.5
SPEC GRAV UA: 1.02
Urobilinogen, UA: 1

## 2014-10-12 MED ORDER — GABAPENTIN 300 MG PO CAPS: ORAL_CAPSULE | ORAL | Status: DC

## 2014-10-12 MED ORDER — FLUTICASONE-SALMETEROL 100-50 MCG/DOSE IN AEPB: 1.0000 | INHALATION_SPRAY | Freq: Two times a day (BID) | RESPIRATORY_TRACT | Status: DC

## 2014-10-12 MED ORDER — TRAMADOL HCL 50 MG PO TABS: 50.0000 mg | ORAL_TABLET | Freq: Three times a day (TID) | ORAL | Status: DC | PRN

## 2014-10-12 NOTE — Progress Notes (Addendum)
Subjective:  This chart was scribed for Darlyne Russian, MD, by Starleen Arms, ED Scribe. This patient was seen in room Rm 5 and the patient's care was started at 10:33 AM.   Patient ID: Elijah Barton, male    DOB: 11/05/1955, 57 y.o.   MRN: [id number]  HPI HPI Comments: Travarus Trudo is a 58 y.o. male with a history of chronic back pain for 2-3 years who presents to the Emergency Department complaining of an exacerbation of back and joint pain worsened by touch without a specific precipitating event.  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  Patient is currently not being following for pain management and states he comes to [facility] whenever he has an issue.     Patient also states he has "drinking problem" consistently consuming 500 mL/day of vodka on the weekend and 250 mL/day on weekdays.  He consumed 500 mL of vodka yesterday.   Patient reports he has thought about seeking substance abuse treatment but due to financial reasons, including his girlfriend being on disability, he has been prevented from doing so.  Patient has had 30 days of out-patient treatment for alcohol in the 1980s that he feels did not help him.  He reports that his drinking has worsened of late due to cyclical pattern of seeking pain relief.  Patient has a history of COPD, peripheral neuropathy.  Patient smoked marijuana 30+ years ago and denies use of other drugs.  Patient is a current 2 pack/day smoker.    PCP: none Review of Systems  Musculoskeletal: Positive for back pain and arthralgias.       Objective:   Physical Exam  Constitutional:  Ruddy complected male.  No distress  Cardiovascular: Normal rate, regular rhythm and normal heart sounds.   Pulmonary/Chest: Effort normal. No respiratory distress. He has no wheezes. He has no rales.  Abdominal: There is no tenderness.  Musculoskeletal:  Diffuse tenderness over the perilumbar and perithoracic  muscles.  Appears to have degenerative changes of the hand sand knees.    Results for orders placed or performed in visit on 10/12/14  POCT CBC  Result Value Ref Range   WBC 6.2 4.6 - 10.2 K/uL   Lymph, poc 1.6 0.6 - 3.4   POC LYMPH PERCENT 25.6 10 - 50 %L   MID (cbc) 0.3 0 - 0.9   POC MID % 5.2 0 - 12 %M   POC Granulocyte 4.3 2 - 6.9   Granulocyte percent 69.2 37 - 80 %G   RBC 4.95 4.69 - 6.13 M/uL   Hemoglobin 17.1 14.1 - 18.1 g/dL   HCT, POC 50.7 43.5 - 53.7 %   MCV 102.4 (A) 80 - 97 fL   MCH, POC 34.5 (A) 27 - 31.2 pg   MCHC 33.7 31.8 - 35.4 g/dL   RDW, POC 13.3 %   Platelet Count, POC 180 142 - 424 K/uL   MPV 7.4 0 - 99.8 fL  POCT urinalysis dipstick  Result Value Ref Range   Color, UA orange    Clarity, UA clear    Glucose, UA neg    Bilirubin, UA small    Ketones, UA neg    Spec Grav, UA 1.020    Blood, UA neg    pH, UA 6.5    Protein, UA trace    Urobilinogen, UA 1.0    Nitrite, UA neg    Leukocytes, UA Negative  POCT UA - Microscopic Only  Result Value Ref Range   WBC, Ur, HPF, POC 0-1    RBC, urine, microscopic 0-1    Bacteria, U Microscopic neg    Mucus, UA neg    Epithelial cells, urine per micros neg    Crystals, Ur, HPF, POC neg    Casts, Ur, LPF, POC neg    Yeast, UA neg    10:46 AM Will order labs.  Counseled patient on importance of alcohol and cigarette abstinence.      Assessment & Plan:   Patient here with complaints of severe back discomfort . He has an addiction to cigarettes and alcohol.  He drank 500 mL of vodka last night and generally drinks 250 mL/day during the week.  He states he cannot afford to go to rehab.  He was advised that if he does not get his alcohol addiction and smoking under control that he faces some serious medical complications. I gave the patient a limited number of Ultram to have for pain. With his heavy cigarette use and alcohol addiction whenever he is ready we need to totally address these problems. He did receive a  flu shot today. I did agree to refill his Neurontin he takes for peripheral neuropathy and his Advair inhaler he has for COPD. Patient is adamant he is not able to go to an alcohol treatment center at this time.

## 2014-10-14 ENCOUNTER — Other Ambulatory Visit: Payer: Self-pay | Admitting: Family Medicine

## 2014-10-14 DIAGNOSIS — E875 Hyperkalemia: Secondary | ICD-10-CM

## 2014-10-14 NOTE — Addendum Note (Signed)
Addended by: Venetia Night on: 10/14/2014 11:35 AM   Modules accepted: Orders

## 2014-10-21 ENCOUNTER — Other Ambulatory Visit (INDEPENDENT_AMBULATORY_CARE_PROVIDER_SITE_OTHER): Payer: BLUE CROSS/BLUE SHIELD | Admitting: Radiology

## 2014-10-21 DIAGNOSIS — E875 Hyperkalemia: Secondary | ICD-10-CM

## 2014-10-21 DIAGNOSIS — R109 Unspecified abdominal pain: Secondary | ICD-10-CM

## 2014-10-21 DIAGNOSIS — F1010 Alcohol abuse, uncomplicated: Secondary | ICD-10-CM

## 2014-10-21 DIAGNOSIS — R0781 Pleurodynia: Secondary | ICD-10-CM

## 2014-10-21 LAB — COMPREHENSIVE METABOLIC PANEL
ALT: 27 U/L (ref 0–53)
AST: 33 U/L (ref 0–37)
Albumin: 4.3 g/dL (ref 3.5–5.2)
Alkaline Phosphatase: 64 U/L (ref 39–117)
BUN: 10 mg/dL (ref 6–23)
CHLORIDE: 103 meq/L (ref 96–112)
CO2: 32 mEq/L (ref 19–32)
Calcium: 9.7 mg/dL (ref 8.4–10.5)
Creat: 0.87 mg/dL (ref 0.50–1.35)
Glucose, Bld: 116 mg/dL — ABNORMAL HIGH (ref 70–99)
POTASSIUM: 4.7 meq/L (ref 3.5–5.3)
Sodium: 142 mEq/L (ref 135–145)
Total Bilirubin: 1.2 mg/dL (ref 0.2–1.2)
Total Protein: 7.2 g/dL (ref 6.0–8.3)

## 2014-10-21 NOTE — Progress Notes (Signed)
Pt here for labs only. 

## 2014-10-22 LAB — PSA: PSA: 0.87 ng/mL (ref <=4.00)

## 2015-11-15 ENCOUNTER — Ambulatory Visit (INDEPENDENT_AMBULATORY_CARE_PROVIDER_SITE_OTHER): Payer: BLUE CROSS/BLUE SHIELD | Admitting: Internal Medicine

## 2015-11-15 VITALS — BP 138/82 | HR 94 | Temp 98.1°F | Resp 17 | Ht 72.5 in | Wt 294.0 lb

## 2015-11-15 DIAGNOSIS — G6289 Other specified polyneuropathies: Secondary | ICD-10-CM

## 2015-11-15 DIAGNOSIS — F17219 Nicotine dependence, cigarettes, with unspecified nicotine-induced disorders: Secondary | ICD-10-CM

## 2015-11-15 DIAGNOSIS — F1010 Alcohol abuse, uncomplicated: Secondary | ICD-10-CM

## 2015-11-15 DIAGNOSIS — M549 Dorsalgia, unspecified: Secondary | ICD-10-CM

## 2015-11-15 MED ORDER — GABAPENTIN 100 MG PO CAPS: 100.0000 mg | ORAL_CAPSULE | Freq: Three times a day (TID) | ORAL | Status: DC

## 2015-11-15 MED ORDER — CYCLOBENZAPRINE HCL 10 MG PO TABS: 10.0000 mg | ORAL_TABLET | Freq: Three times a day (TID) | ORAL | Status: DC | PRN

## 2015-11-15 MED ORDER — MELOXICAM 15 MG PO TABS: 15.0000 mg | ORAL_TABLET | Freq: Every day | ORAL | Status: DC

## 2015-11-15 NOTE — Progress Notes (Signed)
Subjective:    Patient ID: Elijah Barton, male    DOB: 09-07-1956, 60 y.o.   MRN: [id number] By signing my name below, I, Judithe Modest, attest that this documentation has been prepared under the direction and in the presence of Tami Lin, MD. Electronically Signed: Judithe Modest, ER Scribe. 11/15/2015. 9:11 AM.  Chief Complaint  Patient presents with  . Medication Refill    flexeril,gabapentin,     HPI HPI Comments: Elijah Barton is a 60 y.o. male who prefers managing himself, presents to [facility]  for a medication refill. He did not have insurance for a while, which is why he has not been seen recently. He needs to refill his gabapentin and flexeril. He states  He would like to quit drugs and alcohol, but has not been able to so far. He is curious whether there is any pharmaceutical support for his quitting that can be provided.  See last OV Patient Active Problem List   Diagnosis Date Noted  . Alcohol abuse, daily use---He states he continues to overuse alcohol, though he has recently reduced his consumption. He has a live in girlfriend who has been helping him. 10/12/2014  . COPD exacerbation (HCC)2 to smoking--advair occas 09/02/2013  . BMI 40.0-44.9, adult [facility])- He thinks the major problem at this point is that his life style is too sedentary. He has a treadmill at home and is planning on walking on it.  09/02/2013  . Nicotine addiction--has made some progress 10-15 %maybe but wants this to be the year if he can control etoh. he has occas SOB and a past diagnosis of COPD. smokes about 2 ppd and has smoked for >40 years 09/02/2013  . Peripheral neuropathy (HCC)--He states he is in pain nearly all the time due to peripheral neuropathy, joint pain and low back pain.  03/12/2013  His father was an alhoholic and died of liver failure at age 75. His brother drinks heavily. His mother lived to be 83, but had strokes, lung cancer  Allergies  Allergen Reactions  . Penicillins     Current Outpatient Prescriptions on File Prior to Visit  Medication Sig Dispense Refill  . aspirin 81 MG tablet Take 81 mg by mouth daily.      . fish oil-omega-3 fatty acids 1000 MG capsule Take 1 g by mouth daily.      Marland Kitchen gabapentin (NEURONTIN) 300 MG capsule Start at one capsule at night for the first week and then increase to one in the morning and one at night. 60 capsule 2  . magnesium 30 MG tablet Take 30 mg by mouth daily.      . Multiple Vitamin (MULTIVITAMIN) tablet Take 1 tablet by mouth daily.      . polycarbophil (FIBERCON) 625 MG tablet Take 625 mg by mouth daily.    . Probiotic Product (PROBIOTIC FORMULA PO) Take by mouth daily.      . cyclobenzaprine (FLEXERIL) 10 MG tablet Take 1 tablet (10 mg total) by mouth 3 (three) times daily as needed for muscle spasms. (Patient not taking: Reported on 11/15/2015) 30 tablet 3  . Fluticasone-Salmeterol (ADVAIR) 100-50 MCG/DOSE AEPB Inhale 1 puff into the lungs 2 (two) times daily. (Patient not taking: Reported on 11/15/2015) 1 each 11   No current facility-administered medications on file prior to visit.    Review of Systems  Constitutional: Negative for fever, chills and diaphoresis.  Respiratory: Positive for cough and shortness of breath. Negative for wheezing.   Cardiovascular: Negative  for chest pain and palpitations.      Objective:  BP 138/82 mmHg  Pulse 94  Temp(Src) 98.1 F (36.7 C) (Oral)  Resp 17  Ht 6' 0.5" (1.842 m)  Wt 294 lb (133.358 kg)  BMI 39.30 kg/m2  SpO2 95%  Physical Exam  Constitutional: He is oriented to person, place, and time. He appears well-developed and well-nourished. No distress.  HENT:  Head: Normocephalic and atraumatic.  Eyes: Pupils are equal, round, and reactive to light.  Neck: Neck supple.  Cardiovascular: Normal rate, regular rhythm and normal heart sounds.   No murmur heard. Pulmonary/Chest: Effort normal. No respiratory distress.  Musculoskeletal: Normal range of motion.    Neurological: He is alert and oriented to person, place, and time. Coordination normal.  Skin: Skin is warm and dry. He is not diaphoretic.  Psychiatric: He has a normal mood and affect. His behavior is normal.  Nursing note and vitals reviewed.     Assessment & Plan:  Other polyneuropathy ([place])  Back pain, unspecified location  Alcohol abuse, daily use  Cigarette nicotine dependence with nicotine-induced disorder  He maintains that he knows what to do next and has his work cut out -does not need our assistance in this next phase of committing to quit everything and losing weight and getting back in shape  Meds ordered this encounter  Medications  . meloxicam (MOBIC) 15 MG tablet    Sig: Take 1 tablet (15 mg total) by mouth daily.    Dispense:  30 tablet    Refill:  5  . gabapentin (NEURONTIN) 100 MG capsule    Sig: Take 1 capsule (100 mg total) by mouth 3 (three) times daily.    Dispense:  90 capsule    Refill:  11  . cyclobenzaprine (FLEXERIL) 10 MG tablet    Sig: Take 1 tablet (10 mg total) by mouth 3 (three) times daily as needed for muscle spasms.    Dispense:  90 tablet    Refill:  11  add B supplement  I have completed the patient encounter in its entirety as documented by the scribe, with editing by me where necessary. Kazumi Lachney P. Laney Pastor, M.D.

## 2015-11-16 DIAGNOSIS — M549 Dorsalgia, unspecified: Secondary | ICD-10-CM | POA: Insufficient documentation

## 2016-05-20 ENCOUNTER — Ambulatory Visit (INDEPENDENT_AMBULATORY_CARE_PROVIDER_SITE_OTHER): Payer: Self-pay

## 2016-05-20 ENCOUNTER — Ambulatory Visit (INDEPENDENT_AMBULATORY_CARE_PROVIDER_SITE_OTHER): Payer: Self-pay | Admitting: Family Medicine

## 2016-05-20 VITALS — BP 122/80 | HR 89 | Temp 98.7°F | Resp 16 | Ht 72.5 in | Wt 301.4 lb

## 2016-05-20 DIAGNOSIS — S92911A Unspecified fracture of right toe(s), initial encounter for closed fracture: Secondary | ICD-10-CM

## 2016-05-20 DIAGNOSIS — S99921A Unspecified injury of right foot, initial encounter: Secondary | ICD-10-CM

## 2016-05-20 MED ORDER — HYDROCODONE-ACETAMINOPHEN 5-325 MG PO TABS: 1.0000 | ORAL_TABLET | Freq: Four times a day (QID) | ORAL | 0 refills | Status: DC | PRN

## 2016-05-20 NOTE — Progress Notes (Signed)
    Elijah Barton is a 60 y.o. male who presents to [facility] today for right foot injury. Patient stubbed his right fifth toe last night. He notes pain and swelling and bruising. Pain is worse with activity better with rest. No radiating pain weakness or numbness fevers or chills. He's tried some over-the-counter medicines for pain which helped only a little. He feels well otherwise.   Past Medical History:  Diagnosis Date  . Back pain   . Joint pain    all over except for hip  . Peripheral neuropathy [facility])    Past Surgical History:  Procedure Laterality Date  . LYMPH NODE BIOPSY     neck  . TONSILLECTOMY AND ADENOIDECTOMY     Social History  Substance Use Topics  . Smoking status: Current Every Day Smoker    Packs/day: 2.00    Years: 40.00    Types: Cigarettes  . Smokeless tobacco: Never Used  . Alcohol use 7.5 oz/week    15 Standard drinks or equivalent per week     Comment: 0-20 drinks/week.   ROS as above Medications: Current Outpatient Prescriptions  Medication Sig Dispense Refill  . aspirin 81 MG tablet Take 81 mg by mouth daily.      . cyclobenzaprine (FLEXERIL) 10 MG tablet Take 1 tablet (10 mg total) by mouth 3 (three) times daily as needed for muscle spasms. 90 tablet 11  . fish oil-omega-3 fatty acids 1000 MG capsule Take 1 g by mouth daily.      . Fluticasone-Salmeterol (ADVAIR) 100-50 MCG/DOSE AEPB Inhale 1 puff into the lungs 2 (two) times daily. 1 each 11  . gabapentin (NEURONTIN) 100 MG capsule Take 1 capsule (100 mg total) by mouth 3 (three) times daily. 90 capsule 11  . magnesium 30 MG tablet Take 30 mg by mouth daily.      . Multiple Vitamin (MULTIVITAMIN) tablet Take 1 tablet by mouth daily.      . polycarbophil (FIBERCON) 625 MG tablet Take 625 mg by mouth daily.    . Probiotic Product (PROBIOTIC FORMULA PO) Take by mouth daily.      Marland Kitchen HYDROcodone-acetaminophen (NORCO/VICODIN) 5-325 MG tablet Take 1 tablet by mouth every 6 (six) hours as needed. 10  tablet 0   No current facility-administered medications for this visit.    Allergies  Allergen Reactions  . Penicillins      Exam:  BP 122/80 (BP Location: Left Arm, Patient Position: Sitting, Cuff Size: Large)   Pulse 89   Temp 98.7 F (37.1 C) (Oral)   Resp 16   Ht 6' 0.5" (1.842 m)   Wt (!) 301 lb 6.4 oz (136.7 kg)   SpO2 95%   BMI 40.32 kg/m  Gen: Well NAD Right foot: Ecchymosis and swelling around the fourth and fifth toes worse on the fifth. Tender to palpation. Capillary refill and sensation are intact. Pulses are intact of the foot.  X-ray right foot shows a transverse fracture through the midportion of the proximal phalanx of the fifth toe with no significant displacement. Awaiting formal radiology review  No results found for this or any previous visit (from the past 24 hour(s)). No results found.  Assessment and Plan: 60 y.o. male with toe fracture. Complicating factors are morbid obesity poor health COPD and neuropathy. Plan for buddy tape and postoperative shoe. Recheck fracture in 2-4 weeks or so return sooner if needed.  Discussed warning signs or symptoms. Please see discharge instructions. Patient expresses understanding.

## 2016-05-20 NOTE — Patient Instructions (Addendum)
Thank you for coming in today. Take Elijah Barton for severe pain.  Use the buddy tape and post op rigid sole shoe as needed.  If all is well we will want to recheck the fracture in 2-4 weeks.  Return sooner if needed.    Toe Fracture A toe fracture is a break in one of the toe bones (phalanges). CAUSES This condition may be caused by:  Dropping a heavy object on your toe.  Stubbing your toe.  Overusing your toe or doing repetitive exercise.  Twisting or stretching your toe out of place. RISK FACTORS This condition is more likely to develop in people who:  Play contact sports.  Have a bone disease.  Have a low calcium level. SYMPTOMS The main symptoms of this condition are swelling and pain in the toe. The pain may get worse with standing or walking. Other symptoms include:  Bruising.  Stiffness.  Numbness.  A change in the way the toe looks.  Broken bones that poke through the skin.  Blood beneath the toenail. DIAGNOSIS This condition is diagnosed with a physical exam. You may also have X-rays. TREATMENT  Treatment for this condition depends on the type of fracture and its severity. Treatment may involve:  Taping the broken toe to a toe that is next to it (buddy taping). This is the most common treatment for fractures in which the bone has not moved out of place (nondisplaced fracture).  Wearing a shoe that has a wide, rigid sole to protect the toe and to limit its movement.  Wearing a walking cast.  Having a procedure to move the toe back into place.  Surgery. This may be needed:  If there are many pieces of broken bone that are out of place (displaced).  If the toe joint breaks.  If the bone breaks through the skin.  Physical therapy. This is done to help regain movement and strength in the toe. You may need follow-up X-rays to make sure that the bone is healing well and staying in position. HOME CARE INSTRUCTIONS If You Have a Cast:  Do not stick  anything inside the cast to scratch your skin. Doing that increases your risk of infection.  Check the skin around the cast every day. Report any concerns to your health care provider. You may put lotion on dry skin around the edges of the cast. Do not apply lotion to the skin underneath the cast.  Do not put pressure on any part of the cast until it is fully hardened. This may take several hours.  Keep the cast clean and dry. Bathing  Do not take baths, swim, or use a hot tub until your health care provider approves. Ask your health care provider if you can take showers. You may only be allowed to take sponge baths for bathing.  If your health care provider approves bathing and showering, cover the cast or bandage (dressing) with a watertight plastic bag to protect it from water. Do not let the cast or dressing get wet. Managing Pain, Stiffness, and Swelling  If you do not have a cast, apply ice to the injured area, if directed.  Put ice in a plastic bag.  Place a towel between your skin and the bag.  Leave the ice on for 20 minutes, 2-3 times per day.  Move your toes often to avoid stiffness and to lessen swelling.  Raise (elevate) the injured area above the level of your heart while you are sitting or lying down.  Driving  Do not drive or operate heavy machinery while taking pain medicine.  Do not drive while wearing a cast on a foot that you use for driving. Activity  Return to your normal activities as directed by your health care provider. Ask your health care provider what activities are safe for you.  Perform exercises daily as directed by your health care provider or physical therapist. Safety  Do not use the injured limb to support your body weight until your health care provider says that you can. Use crutches or other assistive devices as directed by your health care provider. General Instructions  If your toe was treated with buddy taping, follow your health care  provider's instructions for changing the gauze and tape. Change it more often:  The gauze and tape get wet. If this happens, dry the space between the toes.  The gauze and tape are too tight and cause your toe to become pale or numb.  Wear a protective shoe as directed by your health care provider. If you were not given a protective shoe, wear sturdy, supportive shoes. Your shoes should not pinch your toes and should not fit tightly against your toes.  Do not use any tobacco products, including cigarettes, chewing tobacco, or e-cigarettes. Tobacco can delay bone healing. If you need help quitting, ask your health care provider.  Take medicines only as directed by your health care provider.  Keep all follow-up visits as directed by your health care provider. This is important. SEEK MEDICAL CARE IF:  You have a fever.  Your pain medicine is not helping.  Your toe is cold.  Your toe is numb.  You still have pain after one week of rest and treatment.  You still have pain after your health care provider has said that you can start walking again.  You have pain, tingling, or numbness in your foot that is not going away. SEEK IMMEDIATE MEDICAL CARE IF:  You have severe pain.  You have redness or inflammation in your toe that is getting worse.  You have pain or numbness in your toe that is getting worse.  Your toe turns blue.   This information is not intended to replace advice given to you by your health care provider. Make sure you discuss any questions you have with your health care provider.   Document Released: 09/30/2000 Document Revised: 06/24/2015 Document Reviewed: 07/30/2014 Elsevier Interactive Patient Education 2016 [employer]vier Inc[employer]   Buddy Taping You have a minor finger or toe injury. It can be managed by buddy taping. Buddy taping means the injured finger or toe is taped to a healthy uninjured adjacent finger or toe. Most minor fractures and dislocations of the  smaller fingers and toes will heal in 3 to 4 weeks. Buddy taping immobilizes and protects the area of injury. Buddy taping is not recommended for initial treatment of fractures of the thumb, longer fingers, or the great toe. Buddy taping should not be used for unstable or deformed fractures, but as fracture healing progresses it may be used for protection during rehabilitation. Fractured fingers and toes should be protected by buddy taping as long as the injury is still painful or swollen.  When an injury is buddy taped, place a small piece of gauze or cotton between the digits that are taped. This helps prevent the skin from breaking down from increased moisture. Buddy taping allows you to get your injury wet when you bathe. Change the gauze and tape more often if it gets  wet, and dry the space between the finger or toes. Use a sturdy, hard-soled shoe for better support if you have a fractured toe. In 2 to 3 weeks you can start motion exercises. This will keep the fingers or toes from becoming stiff.  SEEK IMMEDIATE MEDICAL CARE IF:   The injured area becomes cold, numb, or pale.  You have pain not controlled with medications.  You notice increasing deformity of the toe or finger.   This information is not intended to replace advice given to you by your health care provider. Make sure you discuss any questions you have with your health care provider.   Document Released: 11/10/2004 Document Revised: 10/24/2014 Document Reviewed: 02/25/2015 Elsevier Interactive Patient Education 2016 [employer].    IF you received an x-ray today, you will receive an invoice from [facility]. Please contact [facility] at [phone] with questions or concerns regarding your invoice.   IF you received labwork today, you will receive an invoice from [employer]. Please contact [facility] at [phone] with questions or concerns regarding your invoice.   Our billing  staff will not be able to assist you with questions regarding bills from these companies.  You will be contacted with the lab results as soon as they are available. The fastest way to get your results is to activate your My Chart account. Instructions are located on the last page of this paperwork. If you have not heard from [place] regarding the results in 2 weeks, please contact this office.

## 2017-02-10 ENCOUNTER — Telehealth: Payer: Self-pay

## 2017-02-10 NOTE — Telephone Encounter (Signed)
Receiving multiple faxes Elijah Barton Gabapentin 100mg .  IC Pt, discussed pt needs to be evaluated- Has not been evaluated since 10/2015 by Laney Pastor.  Pt states he will call for appt with new provider - too busy right now

## 2017-06-28 ENCOUNTER — Encounter: Payer: Self-pay | Admitting: Physician Assistant

## 2017-06-28 ENCOUNTER — Ambulatory Visit (INDEPENDENT_AMBULATORY_CARE_PROVIDER_SITE_OTHER): Payer: Self-pay

## 2017-06-28 ENCOUNTER — Telehealth: Payer: Self-pay | Admitting: Physician Assistant

## 2017-06-28 ENCOUNTER — Ambulatory Visit (INDEPENDENT_AMBULATORY_CARE_PROVIDER_SITE_OTHER): Payer: Self-pay | Admitting: Physician Assistant

## 2017-06-28 VITALS — BP 135/86 | HR 102 | Temp 99.3°F | Resp 18 | Ht 72.5 in | Wt 281.2 lb

## 2017-06-28 DIAGNOSIS — R05 Cough: Secondary | ICD-10-CM

## 2017-06-28 DIAGNOSIS — R059 Cough, unspecified: Secondary | ICD-10-CM

## 2017-06-28 DIAGNOSIS — G6289 Other specified polyneuropathies: Secondary | ICD-10-CM

## 2017-06-28 DIAGNOSIS — M545 Low back pain: Secondary | ICD-10-CM

## 2017-06-28 DIAGNOSIS — G8929 Other chronic pain: Secondary | ICD-10-CM

## 2017-06-28 DIAGNOSIS — Z6837 Body mass index (BMI) 37.0-37.9, adult: Secondary | ICD-10-CM

## 2017-06-28 DIAGNOSIS — Z1329 Encounter for screening for other suspected endocrine disorder: Secondary | ICD-10-CM

## 2017-06-28 DIAGNOSIS — J441 Chronic obstructive pulmonary disease with (acute) exacerbation: Secondary | ICD-10-CM

## 2017-06-28 DIAGNOSIS — F1010 Alcohol abuse, uncomplicated: Secondary | ICD-10-CM

## 2017-06-28 DIAGNOSIS — Z114 Encounter for screening for human immunodeficiency virus [HIV]: Secondary | ICD-10-CM

## 2017-06-28 DIAGNOSIS — Z125 Encounter for screening for malignant neoplasm of prostate: Secondary | ICD-10-CM

## 2017-06-28 DIAGNOSIS — F17219 Nicotine dependence, cigarettes, with unspecified nicotine-induced disorders: Secondary | ICD-10-CM

## 2017-06-28 DIAGNOSIS — R509 Fever, unspecified: Secondary | ICD-10-CM

## 2017-06-28 LAB — POCT CBC
GRANULOCYTE PERCENT: 89 % — AB (ref 37–80)
HCT, POC: 49.3 % (ref 43.5–53.7)
Hemoglobin: 16.8 g/dL (ref 14.1–18.1)
LYMPH, POC: 1.2 (ref 0.6–3.4)
MCH, POC: 34.8 pg — AB (ref 27–31.2)
MCHC: 34 g/dL (ref 31.8–35.4)
MCV: 102.3 fL — AB (ref 80–97)
MID (CBC): 0.3 (ref 0–0.9)
MPV: 7.8 fL (ref 0–99.8)
PLATELET COUNT, POC: 293 10*3/uL (ref 142–424)
POC Granulocyte: 12.2 — AB (ref 2–6.9)
POC LYMPH %: 9.1 % — AB (ref 10–50)
POC MID %: 1.9 %M (ref 0–12)
RBC: 4.82 M/uL (ref 4.69–6.13)
RDW, POC: 12.7 %
WBC: 13.7 10*3/uL — AB (ref 4.6–10.2)

## 2017-06-28 MED ORDER — FLUTICASONE-SALMETEROL 100-50 MCG/DOSE IN AEPB: 1.0000 | INHALATION_SPRAY | Freq: Two times a day (BID) | RESPIRATORY_TRACT | 11 refills | Status: DC

## 2017-06-28 MED ORDER — ALBUTEROL SULFATE HFA 108 (90 BASE) MCG/ACT IN AERS
2.0000 | INHALATION_SPRAY | RESPIRATORY_TRACT | 1 refills | Status: DC | PRN
Start: 2017-06-28 — End: 2018-03-16

## 2017-06-28 MED ORDER — CYCLOBENZAPRINE HCL 10 MG PO TABS: 10.0000 mg | ORAL_TABLET | Freq: Three times a day (TID) | ORAL | 11 refills | Status: DC | PRN

## 2017-06-28 MED ORDER — DOXYCYCLINE HYCLATE 100 MG PO CAPS: 100.0000 mg | ORAL_CAPSULE | Freq: Two times a day (BID) | ORAL | 0 refills | Status: AC

## 2017-06-28 MED ORDER — GABAPENTIN 100 MG PO CAPS: 100.0000 mg | ORAL_CAPSULE | Freq: Three times a day (TID) | ORAL | 11 refills | Status: DC

## 2017-06-28 MED ORDER — ALBUTEROL SULFATE (2.5 MG/3ML) 0.083% IN NEBU
2.5000 mg | INHALATION_SOLUTION | Freq: Once | RESPIRATORY_TRACT | Status: AC
  Administered 2017-06-28: 2.5 mg via RESPIRATORY_TRACT

## 2017-06-28 MED ORDER — GUAIFENESIN ER 1200 MG PO TB12: 1.0000 | ORAL_TABLET | Freq: Two times a day (BID) | ORAL | 1 refills | Status: DC | PRN

## 2017-06-28 MED ORDER — PREDNISONE 20 MG PO TABS: ORAL_TABLET | ORAL | 0 refills | Status: DC

## 2017-06-28 MED ORDER — IPRATROPIUM BROMIDE 0.02 % IN SOLN
0.5000 mg | Freq: Once | RESPIRATORY_TRACT | Status: AC
  Administered 2017-06-28: 0.5 mg via RESPIRATORY_TRACT

## 2017-06-28 MED ORDER — BENZONATATE 100 MG PO CAPS: 100.0000 mg | ORAL_CAPSULE | Freq: Three times a day (TID) | ORAL | 0 refills | Status: DC | PRN

## 2017-06-28 NOTE — Telephone Encounter (Signed)
Pt is needing to remind chelle of the oral steriod that they talked about it needs to be calledin to [facility] [address]   Best number [phone]

## 2017-06-28 NOTE — Telephone Encounter (Signed)
Please advise 

## 2017-06-28 NOTE — Telephone Encounter (Signed)
Thank you for the reminder!  Meds ordered this encounter  Medications  . predniSONE (DELTASONE) 20 MG tablet    Sig: Take 3 PO QAM x3days, 2 PO QAM x3days, 1 PO QAM x3days    Dispense:  18 tablet    Refill:  0    Order Specific Question:   Supervising Provider    Answer:   Brigitte Pulse, EVA N [[id number]]

## 2017-06-28 NOTE — Patient Instructions (Addendum)
It looks like you may be developing pneumonia. It's important that we repeat the radiographs in 4 weeks, to verify that it resolves, and that it is not hiding a mass.    IF you received an x-ray today, you will receive an invoice from [facility]. Please contact [facility] at [phone] with questions or concerns regarding your invoice.   IF you received labwork today, you will receive an invoice from [facility]. Please contact [facility] at [phone] with questions or concerns regarding your invoice.   Our billing staff will not be able to assist you with questions regarding bills from these companies.  You will be contacted with the lab results as soon as they are available. The fastest way to get your results is to activate your My Chart account. Instructions are located on the last page of this paperwork. If you have not heard from [place] regarding the results in 2 weeks, please contact this office.

## 2017-06-28 NOTE — Progress Notes (Signed)
Patient ID: Elijah Barton, male    DOB: May 01, 1956, 61 y.o.   MRN: [id number]  PCP: Patient, No Pcp Per  Chief Complaint  Patient presents with  . Shortness of Breath    x4 days pt states he had fever,cough, and chills on saturday. Pt states he can't fully exhale and he feels bloated.  . Medication Refill    Gabapentin 100 MG, Flexeril 10 MG  . Peripheral Neuropathy    Subjective:   Presents for evaluation of shortness of breath. He is accompanied by his wife, Cecille Rubin.  Mid-grade fever 102 on Saturday 9/08. Chills, sweats in alternation. Breathing difficulty has become progressively worse since then. Cough is productive "after 3-4 coughs," green in color. Nasal and sinus congestion, runny nose. Intermittent sore throat. Back and chest aching with cough. Tremulous/jittery/shaking.  Chronic LEFT shoulder pain, not worse than usual. No ear discomfort. No nausea, vomiting or diarrhea, but stool is not as firm as usual (has increased fiber).  Peripheral neuropathy. "Alcohol induced". "High functioning alcoholic." Last drink last night, typically drinks 375 mL vodka and a couple of beers each day.  Smokes 2-3 ppd since age 68 (90-135 pack years).  Ran out of his medicines in January. His wife has had 4V CABG, and he has been her caretaker, leaving little time to address his own health needs. Also, significant medical bills from her care have meant limited funds for his prescriptions and follow-up. He was seen here for an acute visit (toe injury) in 04/2016, and neuropathy last address here 10/2015.  Used hydrocodone PRN for back pain.  Review of Systems As above.    Patient Active Problem List   Diagnosis Date Noted  . Toe fracture, right 05/20/2016  . 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 Alcohol abuse, daily use 10/12/2014  . COPD exacerbation ([place]) 09/02/2013  . BMI 40.0-44.9, adult ([place]) 09/02/2013  . Nicotine addiction 09/02/2013  . Peripheral neuropathy 03/12/2013     Prior  to Admission medications   Medication Sig Start Date End Date Taking? Authorizing Provider  aspirin 81 MG tablet Take 81 mg by mouth daily.      [provider]  cyclobenzaprine (FLEXERIL) 10 MG tablet Take 1 tablet (10 mg total) by mouth 3 (three) times daily as needed for muscle spasms. Patient not taking: Reported on 06/28/2017 11/15/15   Leandrew Koyanagi, MD  fish oil-omega-3 fatty acids 1000 MG capsule Take 1 g by mouth daily.      [provider]  Fluticasone-Salmeterol (ADVAIR) 100-50 MCG/DOSE AEPB Inhale 1 puff into the lungs 2 (two) times daily. Patient not taking: Reported on 06/28/2017 10/12/14   Darlyne Russian, MD  gabapentin (NEURONTIN) 100 MG capsule Take 1 capsule (100 mg total) by mouth 3 (three) times daily. Patient not taking: Reported on 06/28/2017 11/15/15   Leandrew Koyanagi, MD  HYDROcodone-acetaminophen (NORCO/VICODIN) 5-325 MG tablet Take 1 tablet by mouth every 6 (six) hours as needed. Patient not taking: Reported on 06/28/2017 05/20/16   Gregor Hams, MD  magnesium 30 MG tablet Take 30 mg by mouth daily.      [provider]  Multiple Vitamin (MULTIVITAMIN) tablet Take 1 tablet by mouth daily.      [provider]  polycarbophil (FIBERCON) 625 MG tablet Take 625 mg by mouth daily.    [provider]  Probiotic Product (PROBIOTIC FORMULA PO) Take by mouth daily.      [provider]     Allergies  Allergen  Reactions  . Penicillins        Objective:  Physical Exam  Constitutional: He is oriented to person, place, and time. He appears well-developed and well-nourished. He is active and cooperative. He appears distressed ("tripod" positioning, chest accessory muscle use. No nasal flaring. ABle to communicate in full sentences.).  BP 135/86 (BP Location: Right Arm, Patient Position: Sitting, Cuff Size: Large)   Pulse (!) 102   Temp 99.3 F (37.4 C) (Oral)   Resp 18   Ht 6' 0.5" (1.842 m)   Wt 281 lb 3.2 oz  (127.6 kg)   SpO2 93%   BMI 37.61 kg/m   HENT:  Head: Normocephalic and atraumatic.  Right Ear: Hearing, tympanic membrane, external ear and ear canal normal.  Left Ear: Hearing, tympanic membrane, external ear and ear canal normal.  Nose: Nose normal.  Mouth/Throat: Uvula is midline, oropharynx is clear and moist and mucous membranes are normal. No oral lesions.  Eyes: Pupils are equal, round, and reactive to light. Conjunctivae and EOM are normal. No scleral icterus.  Neck: Normal range of motion, full passive range of motion without pain and phonation normal. Neck supple. No thyromegaly present.  Cardiovascular: Regular rhythm and normal heart sounds.  Tachycardia present.   Pulses:      Radial pulses are 2+ on the right side, and 2+ on the left side.  Pulmonary/Chest: Accessory muscle usage present. He is in respiratory distress. He has decreased breath sounds (diffuse). He has wheezes (diffuse, high-pitched, musical). He has no rhonchi. He has no rales.  Lymphadenopathy:       Head (right side): No tonsillar, no preauricular, no posterior auricular and no occipital adenopathy present.       Head (left side): No tonsillar, no preauricular, no posterior auricular and no occipital adenopathy present.    He has no cervical adenopathy.       Right: No supraclavicular adenopathy present.       Left: No supraclavicular adenopathy present.  Neurological: He is alert and oriented to person, place, and time. No sensory deficit.  Skin: Skin is warm, dry and intact. No rash noted. No cyanosis or erythema. Nails show no clubbing.  Psychiatric: He has a normal mood and affect. His speech is normal and behavior is normal. Thought content normal.   He was placed on O2 by [facility] at 4 LPM upon arrival, due to pulse ox of 84%. Notes that his tremulousness, SOB and unsteadiness have improved, and the peripheral neuropathy is "better than it's been in months."   Results for orders placed or performed in  visit on 06/28/17  POCT CBC  Result Value Ref Range   WBC 13.7 (A) 4.6 - 10.2 K/uL   Lymph, poc 1.2 0.6 - 3.4   POC LYMPH PERCENT 9.1 (A) 10 - 50 %L   MID (cbc) 0.3 0 - 0.9   POC MID % 1.9 0 - 12 %M   POC Granulocyte 12.2 (A) 2 - 6.9   Granulocyte percent 89.0 (A) 37 - 80 %G   RBC 4.82 4.69 - 6.13 M/uL   Hemoglobin 16.8 14.1 - 18.1 g/dL   HCT, POC 49.3 43.5 - 53.7 %   MCV 102.3 (A) 80 - 97 fL   MCH, POC 34.8 (A) 27 - 31.2 pg   MCHC 34.0 31.8 - 35.4 g/dL   RDW, POC 12.7 %   Platelet Count, POC 293 142 - 424 K/uL   MPV 7.8 0 - 99.8 fL   Dg Chest  2 View  Result Date: 06/28/2017 CLINICAL DATA:  Shortness of breath, hypoxia, wheezing. History of COPD. Current smoker. EXAM: CHEST  2 VIEW COMPARISON:  Chest x-ray of May 02, 2014 FINDINGS: The lungs are adequately inflated. The interstitial markings are increased bilaterally. There is hazy increased density in the lateral aspect of the lingula. There is no pleural effusion or pneumothorax. The heart and pulmonary vascularity are normal. There is calcification in the wall of the aortic arch. There is mild multilevel degenerative disc disease of the thoracic spine. IMPRESSION: COPD. Increased interstitial markings suggests superimposed acute bronchitis. Patchy density in the lateral aspect of the lingula may reflect atelectasis or early pneumonia. Followup PA and lateral chest X-ray is recommended in 3-4 weeks following trial of antibiotic therapy to ensure resolution and exclude underlying malignancy. Thoracic aortic atherosclerosis. Electronically Signed   By: David  Martinique M.D.   On: 06/28/2017 10:12   Following albuterol and Atrovent nebulized treatment, his respiratory distress resolved and wheezing improved some.    Assessment & Plan:   1. COPD exacerbation ([place]) Possible early CAP. Start doxycycline. He's concerned about the cost of Advair, so we agree to change that to oral prednisone taper for the short term. Close follow-up. Consider  home O2. Consider home nebs (they have a nebulizer). - albuterol (PROVENTIL) (2.5 MG/3ML) 0.083% nebulizer solution 2.5 mg; Take 3 mLs (2.5 mg total) by nebulization once. - ipratropium (ATROVENT) nebulizer solution 0.5 mg; Take 2.5 mLs (0.5 mg total) by nebulization once. - doxycycline (VIBRAMYCIN) 100 MG capsule; Take 1 capsule (100 mg total) by mouth 2 (two) times daily.  Dispense: 20 capsule; Refill: 0 - albuterol (PROVENTIL HFA;VENTOLIN HFA) 108 (90 Base) MCG/ACT inhaler; Inhale 2 puffs into the lungs every 4 (four) hours as needed for wheezing or shortness of breath (cough, shortness of breath or wheezing.).  Dispense: 1 Inhaler; Refill: 1 - benzonatate (TESSALON) 100 MG capsule; Take 1-2 capsules (100-200 mg total) by mouth 3 (three) times daily as needed for cough.  Dispense: 40 capsule; Refill: 0 - Guaifenesin (MUCINEX MAXIMUM STRENGTH) 1200 MG TB12; Take 1 tablet (1,200 mg total) by mouth every 12 (twelve) hours as needed.  Dispense: 14 tablet; Refill: 1 - Fluticasone-Salmeterol (ADVAIR) 100-50 MCG/DOSE AEPB; Inhale 1 puff into the lungs 2 (two) times daily.  Dispense: 1 each; Refill: 11  2. Other polyneuropathy Improved with O2. Consider home O2. Resume gabapentin. - gabapentin (NEURONTIN) 100 MG capsule; Take 1 capsule (100 mg total) by mouth 3 (three) times daily.  Dispense: 90 capsule; Refill: 11  3. Cigarette nicotine dependence with nicotine-induced disorder 90-135 pack years. Not ready to quit. Encouraged consideration.  4. Alcohol abuse, daily use Not interested in cutting back.  5. Cough - DG Chest 2 View; Future  6. Fever, unspecified - POCT CBC - Comprehensive metabolic panel  7. BMI 37.0-37.9, adult - Lipid panel  8. Screening for thyroid disorder - TSH  9. Screening for prostate cancer -PSA  10. Screening for HIV (human immunodeficiency virus) - HIV antibody  11. Chronic low back pain without sciatica, unspecified back pain laterality Resume  cyclobenzaprine, but not hydrocodone, as it is a respiratory depressant and due to his substantial alcohol consumption. - cyclobenzaprine (FLEXERIL) 10 MG tablet; Take 1 tablet (10 mg total) by mouth 3 (three) times daily as needed for muscle spasms.  Dispense: 90 tablet; Refill: 11    Return in about 2 days (around 06/30/2017) for re-evaluation of cough, SOB. Then 4 weeks for follow-up CXR.  Stephaie Dardis  Janalee Dane, PA-C [facility]

## 2017-06-29 LAB — LIPID PANEL
Chol/HDL Ratio: 2.6 ratio (ref 0.0–5.0)
Cholesterol, Total: 131 mg/dL (ref 100–199)
HDL: 51 mg/dL (ref >39)
LDL CALC: 65 mg/dL (ref 0–99)
TRIGLYCERIDES: 77 mg/dL (ref 0–149)
VLDL CHOLESTEROL CAL: 15 mg/dL (ref 5–40)

## 2017-06-29 LAB — COMPREHENSIVE METABOLIC PANEL
ALBUMIN: 3.8 g/dL (ref 3.6–4.8)
ALT: 21 IU/L (ref 0–44)
AST: 28 IU/L (ref 0–40)
Albumin/Globulin Ratio: 1.3 (ref 1.2–2.2)
Alkaline Phosphatase: 85 IU/L (ref 39–117)
BUN / CREAT RATIO: 17 (ref 10–24)
BUN: 12 mg/dL (ref 8–27)
Bilirubin Total: 0.6 mg/dL (ref 0.0–1.2)
CALCIUM: 9.2 mg/dL (ref 8.6–10.2)
CO2: 26 mmol/L (ref 20–29)
CREATININE: 0.7 mg/dL — AB (ref 0.76–1.27)
Chloride: 90 mmol/L — ABNORMAL LOW (ref 96–106)
GFR calc Af Amer: 117 mL/min/{1.73_m2} (ref >59)
GFR, EST NON AFRICAN AMERICAN: 101 mL/min/{1.73_m2} (ref >59)
GLUCOSE: 121 mg/dL — AB (ref 65–99)
Globulin, Total: 3 g/dL (ref 1.5–4.5)
Potassium: 4.2 mmol/L (ref 3.5–5.2)
Sodium: 133 mmol/L — ABNORMAL LOW (ref 134–144)
Total Protein: 6.8 g/dL (ref 6.0–8.5)

## 2017-06-29 LAB — HIV ANTIBODY (ROUTINE TESTING W REFLEX): HIV Screen 4th Generation wRfx: NONREACTIVE

## 2017-06-29 LAB — TSH: TSH: 1.71 u[IU]/mL (ref 0.450–4.500)

## 2017-06-29 NOTE — Telephone Encounter (Signed)
Spoke with patient, explained that medications may take a few days to begin working for him. Patient states that he was told to make a follow up for Friday 06/30/17, would like to do that now. Appointment made for 9:20 am./ S.Jeptha Hinnenkamp,CMA

## 2017-06-29 NOTE — Telephone Encounter (Signed)
Patient would like to have someone call him in regards to his breathing---first stated he was no better from yesterday when he saw Chelle, but then when I asked again he stated he was alittle better but he still isnt completely better. He would like either Chelle or a nurse to call him as soon as possible, he wanted to make an apt with Chelle for Friday and I told him that I would make him one but that at 10am today they were going to make a decision on the weather and we may not be open and he got very upset and just said have a nurse call him and hung up.  His call back number is [phone]

## 2017-06-30 ENCOUNTER — Ambulatory Visit: Payer: Self-pay | Admitting: Physician Assistant

## 2017-07-03 NOTE — Telephone Encounter (Signed)
Spoke with Pt he states his breathing is somewhat better but not where he would like it to be. Pt also states that one of his Rx didn't make it to the pharmacy and doesn't remember which one. I checked that all meds that day of the visit were e-scribed. Pt also wanted to know what the alternative for the Advair was? I spoke with his pharmacy and they state he picked up everything except the Advair and the prednisone. I called the patient back and informed him that the prednisone is ready for pickup.

## 2017-07-04 ENCOUNTER — Ambulatory Visit (INDEPENDENT_AMBULATORY_CARE_PROVIDER_SITE_OTHER): Payer: Self-pay | Admitting: Physician Assistant

## 2017-07-04 ENCOUNTER — Encounter: Payer: Self-pay | Admitting: Physician Assistant

## 2017-07-04 VITALS — BP 130/87 | HR 102 | Temp 98.3°F | Resp 18 | Ht 72.5 in | Wt 284.2 lb

## 2017-07-04 DIAGNOSIS — F1010 Alcohol abuse, uncomplicated: Secondary | ICD-10-CM

## 2017-07-04 DIAGNOSIS — J441 Chronic obstructive pulmonary disease with (acute) exacerbation: Secondary | ICD-10-CM

## 2017-07-04 NOTE — Progress Notes (Signed)
Patient ID: Elijah Barton, male    DOB: 09-05-1956, 61 y.o.   MRN: [id number]  PCP: Patient, No Pcp Per  Chief Complaint  Patient presents with  . Cough    Pt states cough is better than it was  . Shortness of Breath    Pt states breathing is better than it was and states when he takes a deep breathe he has a pain on the right side. Pt still has some shaking.  . Follow-up    Subjective:   Presents for re-evaluation of COPD exacerbation. He is accompanied by his wife, Cecille Rubin.  I saw him urgently on 06/28/2017 with increasing SOB, cough and tremulousness, most likey due to hypoxia, as it improved considerably with the administration of O2 in the office, bringing his pulse ox up from 84% to 94%. CXR revealed some markings suggestive of early infiltrate. He was started on doxycycline, and his previous medications were resumed (Advair Diskus, albuterol inhaler). As he does not have insurance, the Advair was cost-prohibitive, and oral prednisone was sent in. Unfortunately, he has not started it yet, having read that it should not be used if one has a fungal infection, given that he has fungus in his toenails.  Much improved, but not well yet. "has pretty much just slept" since his visit last week. Wife notes "delirious and talking in his sleep" "fixing things all the time," since starting the medications. Seems to happen when he is sleeping sitting up, not when he is lying down.  Taking a deep breath causes pain under the RIGHT ribs. "A lot of it has to do with my position." Raising the RIGHT arm over his head, reduces the pain.  Is interested in starting medication to help him quit drinking. He has successfully reduced his alcohol intake with the help of his wife, who cuts his vodka with water and stops serving it once he reaches the agrees amount. Unfortunately, he becomes verbally manipulative when he reaches that amount and is sometimes successful in getting her to serve him more. She  denies that he becomes physically abusive. She relates that she hides the alcohol once she has served the agreed upon amount, and that he usually can't find it.  Last drink was last night. Typically, he only drinks in the evenings.    Review of Systems As above. No fever, chills, nausea, vomiting, diarrhea. Fatigue, tremulousness, pins and needles feelings in the feet.    Patient Active Problem List   Diagnosis Date Noted  . 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 Alcohol abuse, daily use 10/12/2014  . COPD exacerbation ([place]) 09/02/2013  . BMI 37.0-37.9, adult 09/02/2013  . Nicotine addiction 09/02/2013  . Peripheral neuropathy 03/12/2013     Prior to Admission medications   Medication Sig Start Date End Date Taking? Authorizing Provider  albuterol (PROVENTIL HFA;VENTOLIN HFA) 108 (90 Base) MCG/ACT inhaler Inhale 2 puffs into the lungs every 4 (four) hours as needed for wheezing or shortness of breath (cough, shortness of breath or wheezing.). 06/28/17  Yes Asani Mcburney, PA-C  benzonatate (TESSALON) 100 MG capsule Take 1-2 capsules (100-200 mg total) by mouth 3 (three) times daily as needed for cough. 06/28/17  Yes Geoge Lawrance, PA-C  cyclobenzaprine (FLEXERIL) 10 MG tablet Take 1 tablet (10 mg total) by mouth 3 (three) times daily as needed for muscle spasms. 06/28/17  Yes Niki Cosman, PA-C  doxycycline (VIBRAMYCIN) 100 MG capsule Take 1 capsule (100 mg total) by mouth 2 (two) times daily. 06/28/17 07/08/17  Yes Markees Carns, PA-C  fish oil-omega-3 fatty acids 1000 MG capsule Take 1 g by mouth daily.     Yes [provider]  Fluticasone-Salmeterol (ADVAIR) 100-50 MCG/DOSE AEPB Inhale 1 puff into the lungs 2 (two) times daily. 06/28/17  Yes Lorien Shingler, PA-C  gabapentin (NEURONTIN) 100 MG capsule Take 1 capsule (100 mg total) by mouth 3 (three) times daily. 06/28/17  Yes Soundra Lampley, PA-C  Guaifenesin (MUCINEX MAXIMUM STRENGTH) 1200 MG TB12 Take 1 tablet (1,200 mg total) by  mouth every 12 (twelve) hours as needed. 06/28/17  Yes Kimmerly Lora, PA-C  magnesium 30 MG tablet Take 30 mg by mouth daily.     Yes [provider]  Multiple Vitamin (MULTIVITAMIN) tablet Take 1 tablet by mouth daily.     Yes [provider]  polycarbophil (FIBERCON) 625 MG tablet Take 625 mg by mouth daily.   Yes [provider]  Probiotic Product (PROBIOTIC FORMULA PO) Take by mouth daily.     Yes [provider]  aspirin 81 MG tablet Take 81 mg by mouth daily.      [provider]  HYDROcodone-acetaminophen (NORCO/VICODIN) 5-325 MG tablet Take 1 tablet by mouth every 6 (six) hours as needed. Patient not taking: Reported on 06/28/2017 05/20/16   Gregor Hams, MD  predniSONE (DELTASONE) 20 MG tablet Take 3 PO QAM x3days, 2 PO QAM x3days, 1 PO QAM x3days Patient not taking: Reported on 07/04/2017 06/28/17   Harrison Mons, PA-C     Allergies  Allergen Reactions  . Penicillins        Objective:  Physical Exam  Constitutional: He is oriented to person, place, and time. He appears well-developed and well-nourished. He is active and cooperative. No distress.  BP 130/87 (BP Location: Right Arm, Patient Position: Sitting, Cuff Size: Large)   Pulse (!) 102   Temp 98.3 F (36.8 C) (Oral)   Resp 18   Ht 6' 0.5" (1.842 m)   Wt 284 lb 3.2 oz (128.9 kg)   SpO2 93% Comment: on 4 LPM by nasal cannula  BMI 38.01 kg/m   HENT:  Head: Normocephalic and atraumatic.  Right Ear: Hearing normal.  Left Ear: Hearing normal.  Eyes: Conjunctivae are normal. No scleral icterus.  Neck: Normal range of motion. Neck supple. No thyromegaly present.  Cardiovascular: Normal rate, regular rhythm and normal heart sounds.   Pulses:      Radial pulses are 2+ on the right side, and 2+ on the left side.  No LE edema  Pulmonary/Chest: Effort normal. No respiratory distress. He has wheezes (significantly decreased from 06/28/2017). He has no rales. He exhibits tenderness  (RIGHT lower ribs).  Lymphadenopathy:       Head (right side): No tonsillar, no preauricular, no posterior auricular and no occipital adenopathy present.       Head (left side): No tonsillar, no preauricular, no posterior auricular and no occipital adenopathy present.    He has no cervical adenopathy.       Right: No supraclavicular adenopathy present.       Left: No supraclavicular adenopathy present.  Neurological: He is alert and oriented to person, place, and time. No sensory deficit.  Skin: Skin is warm, dry and intact. No rash noted. No cyanosis or erythema. Nails show no clubbing.  Psychiatric: He has a normal mood and affect. His speech is normal and behavior is normal.           Assessment & Plan:   Problem List  Items Addressed This Visit    COPD exacerbation ([place]) - Primary    Improving. Encouraged him to start the oral prednisone. Discussed home nebs, but albuterol inhaler is currently effective. Discussed home O2 concentrator and portable O2, but he wants to hold off for now. He plans to check his pulse ox at home using a pulse oximeter he purchased online. Re-evaluation in 3-4 weeks with CXR, sooner if his symptoms are not continuing to improve. Smoking cessation encouraged.      Alcohol abuse, daily use    Discussed the possiblity of antabuse. I will discuss it with my supervising physician and medical director. In the meantime, he and his wife will continue working on cutting back.          Return in about 4 weeks (around 08/01/2017) for re-evaluation.   Fara Chute, PA-C [facility]

## 2017-07-04 NOTE — Patient Instructions (Addendum)
1. Go ahead and start the prednisone. 2. Keep working on cutting back on the alcohol. 3. Try to measure your oxygen level over night. Also measure when you are up and about.     IF you received an x-ray today, you will receive an invoice from [facility]. Please contact [facility] at [phone] with questions or concerns regarding your invoice.   IF you received labwork today, you will receive an invoice from [facility]. Please contact [facility] at [phone] with questions or concerns regarding your invoice.   Our billing staff will not be able to assist you with questions regarding bills from these companies.  You will be contacted with the lab results as soon as they are available. The fastest way to get your results is to activate your My Chart account. Instructions are located on the last page of this paperwork. If you have not heard from [place] regarding the results in 2 weeks, please contact this office.

## 2017-07-06 NOTE — Assessment & Plan Note (Addendum)
Discussed the possiblity of antabuse. I will discuss it with my supervising physician and medical director. In the meantime, he and his wife will continue working on cutting back.

## 2017-07-06 NOTE — Assessment & Plan Note (Addendum)
Improving. Encouraged him to start the oral prednisone. Discussed home nebs, but albuterol inhaler is currently effective. Discussed home O2 concentrator and portable O2, but he wants to hold off for now. He plans to check his pulse ox at home using a pulse oximeter he purchased online. Re-evaluation in 3-4 weeks with CXR, sooner if his symptoms are not continuing to improve. Smoking cessation encouraged.

## 2017-07-11 ENCOUNTER — Telehealth: Payer: Self-pay | Admitting: Physician Assistant

## 2017-07-11 DIAGNOSIS — R0902 Hypoxemia: Secondary | ICD-10-CM

## 2017-07-11 NOTE — Telephone Encounter (Signed)
In order to qualify for home oxygen, we need to show that his oxygen level drops. He was going to use his finger pulse ox to take a measurement, rather than me ordering a company to come in to measure it overnight.  I'll order an overnight test.

## 2017-07-11 NOTE — Telephone Encounter (Signed)
Notified Pt of recent referral.

## 2017-07-11 NOTE — Telephone Encounter (Signed)
Pt is calling stating that at his last visit with Chelle, she gave the suggestion about using oxygen at home.  He originally refused the suggestion but is now thinking that it might be helpful and would like to try it.  Please advise with further instruction.

## 2017-07-14 ENCOUNTER — Telehealth: Payer: Self-pay | Admitting: *Deleted

## 2017-07-14 ENCOUNTER — Telehealth: Payer: Self-pay

## 2017-07-14 ENCOUNTER — Telehealth: Payer: Self-pay | Admitting: Physician Assistant

## 2017-07-14 DIAGNOSIS — J449 Chronic obstructive pulmonary disease, unspecified: Secondary | ICD-10-CM

## 2017-07-14 MED ORDER — NON FORMULARY: 99 refills | Status: DC

## 2017-07-14 NOTE — Telephone Encounter (Signed)
I called around to see about getting pt [facility] to come out.. [facility] stated they didn't do that, and [facility] stated they needed a order for ONO testing (over night oximetry)  [facility] Fax# [phone]

## 2017-07-14 NOTE — Telephone Encounter (Signed)
Rx faxed to [facility] for overnight oximetry on room air. Dx: C38.9

## 2017-07-14 NOTE — Telephone Encounter (Signed)
That's EXACTLY what I wanted! THank you!  Meds ordered this encounter  Medications  . NON FORMULARY    Sig: OverNight Oximetry    Dispense:  1 each    Refill:  prn    Please fax order to [facility] [phone]    Order Specific Question:   Supervising Provider    Answer:   Shawnee Knapp [[id number]]

## 2017-07-14 NOTE — Telephone Encounter (Signed)
Jiles Crocker with [facility] needs new RX written stating:  Overnight oximetry on room air.   [phone] fax [phone] ext [phone]

## 2017-07-14 NOTE — Telephone Encounter (Signed)
This is Chelle's pt - she wrote a rx in Epic for it sev hrs ago. See prior telephone note. I will handwrite rx for below request and put in RN's box just in case still needed.

## 2017-08-24 NOTE — Telephone Encounter (Signed)
error 

## 2017-09-16 ENCOUNTER — Encounter: Payer: Self-pay | Admitting: Physician Assistant

## 2017-09-16 ENCOUNTER — Telehealth: Payer: Self-pay | Admitting: Physician Assistant

## 2017-09-16 ENCOUNTER — Ambulatory Visit: Payer: Self-pay | Admitting: Physician Assistant

## 2017-09-16 ENCOUNTER — Ambulatory Visit (INDEPENDENT_AMBULATORY_CARE_PROVIDER_SITE_OTHER): Payer: Self-pay

## 2017-09-16 VITALS — BP 136/80 | HR 102 | Temp 98.0°F | Resp 20 | Ht 72.5 in | Wt 301.2 lb

## 2017-09-16 DIAGNOSIS — J441 Chronic obstructive pulmonary disease with (acute) exacerbation: Secondary | ICD-10-CM

## 2017-09-16 DIAGNOSIS — F17219 Nicotine dependence, cigarettes, with unspecified nicotine-induced disorders: Secondary | ICD-10-CM

## 2017-09-16 DIAGNOSIS — F1010 Alcohol abuse, uncomplicated: Secondary | ICD-10-CM

## 2017-09-16 DIAGNOSIS — R0902 Hypoxemia: Secondary | ICD-10-CM

## 2017-09-16 DIAGNOSIS — R600 Localized edema: Secondary | ICD-10-CM

## 2017-09-16 DIAGNOSIS — R635 Abnormal weight gain: Secondary | ICD-10-CM

## 2017-09-16 LAB — POCT CBC
GRANULOCYTE PERCENT: 72.7 % (ref 37–80)
HEMATOCRIT: 48.4 % (ref 43.5–53.7)
Hemoglobin: 17.1 g/dL (ref 14.1–18.1)
Lymph, poc: 1.5 (ref 0.6–3.4)
MCH: 35.7 pg — AB (ref 27–31.2)
MCHC: 35.3 g/dL (ref 31.8–35.4)
MCV: 101 fL — AB (ref 80–97)
MID (CBC): 0.4 (ref 0–0.9)
MPV: 7.2 fL (ref 0–99.8)
POC GRANULOCYTE: 5 (ref 2–6.9)
POC LYMPH %: 21.3 % (ref 10–50)
POC MID %: 6 %M (ref 0–12)
Platelet Count, POC: 242 10*3/uL (ref 142–424)
RBC: 4.79 M/uL (ref 4.69–6.13)
RDW, POC: 15.6 %
WBC: 6.9 10*3/uL (ref 4.6–10.2)

## 2017-09-16 MED ORDER — BENZONATATE 100 MG PO CAPS: 100.0000 mg | ORAL_CAPSULE | Freq: Three times a day (TID) | ORAL | 0 refills | Status: DC | PRN

## 2017-09-16 MED ORDER — DOXYCYCLINE HYCLATE 100 MG PO CAPS: 100.0000 mg | ORAL_CAPSULE | Freq: Two times a day (BID) | ORAL | 0 refills | Status: AC

## 2017-09-16 MED ORDER — PREDNISONE 20 MG PO TABS: ORAL_TABLET | ORAL | 0 refills | Status: DC

## 2017-09-16 NOTE — Telephone Encounter (Signed)
Please call [facility] to obtain the results of the patient's overnight oximetry.

## 2017-09-16 NOTE — Progress Notes (Signed)
Patient ID: Elijah Barton, male    DOB: 12-23-1955, 60 y.o.   MRN: [id number]  PCP: Patient, No Pcp Per  Chief Complaint  Patient presents with  . Follow-up    COPD exacerbation   . Cough    Productive, White & Green color  . Sinus Problem    Subjective:   Presents for evaluation of increased SOB, DOE, cough, fatigue. He is accompanied by his wife.  I last saw him in September with a COPD exacerbation. He had run out of medications and not followed up due to the financial and time strain of his wife's recent health issues. His situation was, and is, complicated by alcoholism and lack of health insurance..  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 He and his wife have developed a cough, now white and green sputum.  Dramatic loss of energy. Easily winded, walking only 10 feet. Sinus drainage. Feels like he did when we met in September. Unfortunately did not follow-up as planned. He did improve with treatment.  He did have the overnight oximetry. ?Result? Performed by [facility].  No change in alcohol consumption. Weight gain, "I'm putting my winter coat on."   Review of Systems  Constitutional: Positive for activity change, fatigue and unexpected weight change. Negative for appetite change, chills, diaphoresis and fever.  HENT: Positive for congestion, rhinorrhea and sinus pressure.   Eyes: Negative for itching and visual disturbance.  Respiratory: Positive for cough and shortness of breath.   Cardiovascular: Positive for leg swelling.  Gastrointestinal: Negative for abdominal distention, abdominal pain, blood in stool, constipation, diarrhea, nausea and vomiting.  Genitourinary: Negative for dysuria, frequency, hematuria and urgency.  Skin: Negative.   Neurological: Positive for tremors and weakness.  Hematological: Negative for adenopathy. Does not bruise/bleed easily.        Patient Active Problem List   Diagnosis Date Noted  . 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 Alcohol abuse, daily use 10/12/2014  . COPD exacerbation ([place]) 09/02/2013  . BMI 37.0-37.9, adult 09/02/2013  . Nicotine addiction 09/02/2013  . Peripheral neuropathy 03/12/2013     Prior to Admission medications   Medication Sig Start Date End Date Taking? Authorizing Provider  albuterol (PROVENTIL HFA;VENTOLIN HFA) 108 (90 Base) MCG/ACT inhaler Inhale 2 puffs into the lungs every 4 (four) hours as needed for wheezing or shortness of breath (cough, shortness of breath or wheezing.). 06/28/17  Yes Illyana Schorsch, PA-C  aspirin 81 MG tablet Take 81 mg by mouth daily.     Yes [provider]  cyclobenzaprine (FLEXERIL) 10 MG tablet Take 1 tablet (10 mg total) by mouth 3 (three) times daily as needed for muscle spasms. 06/28/17  Yes Calia Napp, PA-C  fish oil-omega-3 fatty acids 1000 MG capsule Take 1 g by mouth daily.     Yes [provider]  gabapentin (NEURONTIN) 100 MG capsule Take 1 capsule (100 mg total) by mouth 3 (three) times daily. 06/28/17  Yes Nakiya Rallis, PA-C  Guaifenesin (MUCINEX MAXIMUM STRENGTH) 1200 MG TB12 Take 1 tablet (1,200 mg total) by mouth every 12 (twelve) hours as needed. 06/28/17  Yes Ronit Marczak, PA-C  magnesium 30 MG tablet Take 30 mg by mouth daily.     Yes [provider]  Multiple Vitamin (MULTIVITAMIN) tablet Take 1 tablet by mouth daily.     Yes [provider]  NON FORMULARY OverNight Oximetry 07/14/17  Yes Jacqulynn Cadet,  Waniya Hoglund, PA-C  polycarbophil (FIBERCON) 625 MG tablet Take 625 mg by mouth daily.   Yes [provider]  Probiotic Product (PROBIOTIC FORMULA PO) Take by mouth daily.     Yes [provider]     Allergies  Allergen Reactions  . Penicillins        Objective:  Physical Exam  Constitutional: He is oriented to person, place, and time. He appears well-developed and well-nourished. He is active  and cooperative. No distress.  BP 136/80   Pulse (!) 102   Temp 98 F (36.7 C) (Oral)   Resp 20   Ht 6' 0.5" (1.842 m)   Wt (!) 301 lb 3.2 oz (136.6 kg)   SpO2 96% Comment: With Oxygen  BMI 40.29 kg/m   HENT:  Head: Normocephalic and atraumatic.  Right Ear: Hearing normal.  Left Ear: Hearing normal.  Eyes: Conjunctivae are normal. No scleral icterus.  Neck: Normal range of motion. Neck supple. No thyromegaly present.  Cardiovascular: Normal rate, regular rhythm and normal heart sounds.  Pulses:      Radial pulses are 2+ on the right side, and 2+ on the left side.  1-2+ bilateral LE edema. No presacral edema.  Pulmonary/Chest: Effort normal. He has decreased breath sounds. He has rhonchi (diffuse).  Lymphadenopathy:       Head (right side): No tonsillar, no preauricular, no posterior auricular and no occipital adenopathy present.       Head (left side): No tonsillar, no preauricular, no posterior auricular and no occipital adenopathy present.    He has no cervical adenopathy.       Right: No supraclavicular adenopathy present.       Left: No supraclavicular adenopathy present.  Neurological: He is alert and oriented to person, place, and time. No sensory deficit.  Skin: Skin is warm, dry and intact. No rash noted. No cyanosis or erythema. Nails show no clubbing.  Psychiatric: He has a normal mood and affect. His speech is normal and behavior is normal.    Wt Readings from Last 3 Encounters:  09/16/17 (!) 301 lb 3.2 oz (136.6 kg)  07/04/17 284 lb 3.2 oz (128.9 kg)  06/28/17 281 lb 3.2 oz (127.6 kg)    Results for orders placed or performed in visit on 09/16/17  POCT CBC  Result Value Ref Range   WBC 6.9 4.6 - 10.2 K/uL   Lymph, poc 1.5 0.6 - 3.4   POC LYMPH PERCENT 21.3 10 - 50 %L   MID (cbc) 0.4 0 - 0.9   POC MID % 6.0 0 - 12 %M   POC Granulocyte 5.0 2 - 6.9   Granulocyte percent 72.7 37 - 80 %G   RBC 4.79 4.69 - 6.13 M/uL   Hemoglobin 17.1 14.1 - 18.1 g/dL   HCT,  POC 48.4 43.5 - 53.7 %   MCV 101.0 (A) 80 - 97 fL   MCH, POC 35.7 (A) 27 - 31.2 pg   MCHC 35.3 31.8 - 35.4 g/dL   RDW, POC 15.6 %   Platelet Count, POC 242 142 - 424 K/uL   MPV 7.2 0 - 99.8 fL    Dg Chest 2 View  Result Date: 09/16/2017 CLINICAL DATA:  COPD, cough EXAM: CHEST  2 VIEW COMPARISON:  06/28/2017 FINDINGS: There is hyperinflation of the lungs compatible with COPD. Heart and mediastinal contours are within normal limits. No focal opacities or effusions. No acute bony abnormality. IMPRESSION: COPD.  No active disease. Electronically Signed   By:  Rolm Baptise M.D.   On: 09/16/2017 10:36       Assessment & Plan:   Problem List Items Addressed This Visit    COPD exacerbation ([place]) - Primary    Reassuring CXR. Doxycycline, oral prednisone. Close follow-up. He asks for follow-up in 1 week, and agrees to return sooner if his symptoms are not improving. We need to get the overnight oximetry results, as he likely needs home O2.      Relevant Medications   predniSONE (DELTASONE) 20 MG tablet   doxycycline (VIBRAMYCIN) 100 MG capsule   benzonatate (TESSALON) 100 MG capsule   Other Relevant Orders   POCT CBC (Completed)   DG Chest 2 View (Completed)   Nicotine addiction    No plans to quit, but has had to cut back. Encouraged him not to escalate again.      Alcohol abuse, daily use    Recently worse, as his wife was out of town recently and not there to cut his alcohol with water, and not there to cut him off at the agreed upon amount. COncern for liver disease, but he declines evaluation.       Other Visit Diagnoses    Weight gain       Concern for CHF with significant weight gain and LE edema. Reassuring CXR. Furosemide 20 mg QAM x 5 days. Daily weights.   Bilateral lower extremity edema       Relevant Orders   Comprehensive metabolic panel (Completed)       Return in about 1 week (around 09/23/2017) for re-evaluation of COPD exacerbation and lower leg  swelling.   Fara Chute, PA-C [facility] at [facility]

## 2017-09-16 NOTE — Patient Instructions (Addendum)
Take 20 mg of furosemide each morning for the next 5 days. Measure your weight each day.     IF you received an x-ray today, you will receive an invoice from [facility]. Please contact [facility] at [phone] with questions or concerns regarding your invoice.   IF you received labwork today, you will receive an invoice from [facility]. Please contact [facility] at [phone] with questions or concerns regarding your invoice.   Our billing staff will not be able to assist you with questions regarding bills from these companies.  You will be contacted with the lab results as soon as they are available. The fastest way to get your results is to activate your My Chart account. Instructions are located on the last page of this paperwork. If you have not heard from [place] regarding the results in 2 weeks, please contact this office.

## 2017-09-17 LAB — COMPREHENSIVE METABOLIC PANEL
ALK PHOS: 85 IU/L (ref 39–117)
ALT: 22 IU/L (ref 0–44)
AST: 29 IU/L (ref 0–40)
Albumin/Globulin Ratio: 1.5 (ref 1.2–2.2)
Albumin: 4.3 g/dL (ref 3.6–4.8)
BUN/Creatinine Ratio: 6 — ABNORMAL LOW (ref 10–24)
BUN: 5 mg/dL — AB (ref 8–27)
Bilirubin Total: 1 mg/dL (ref 0.0–1.2)
CALCIUM: 9.7 mg/dL (ref 8.6–10.2)
CO2: 28 mmol/L (ref 20–29)
CREATININE: 0.83 mg/dL (ref 0.76–1.27)
Chloride: 94 mmol/L — ABNORMAL LOW (ref 96–106)
GFR calc Af Amer: 109 mL/min/{1.73_m2} (ref >59)
GFR, EST NON AFRICAN AMERICAN: 94 mL/min/{1.73_m2} (ref >59)
Globulin, Total: 2.8 g/dL (ref 1.5–4.5)
Glucose: 119 mg/dL — ABNORMAL HIGH (ref 65–99)
Potassium: 5.6 mmol/L — ABNORMAL HIGH (ref 3.5–5.2)
Sodium: 140 mmol/L (ref 134–144)
TOTAL PROTEIN: 7.1 g/dL (ref 6.0–8.5)

## 2017-09-18 NOTE — Assessment & Plan Note (Signed)
Reassuring CXR. Doxycycline, oral prednisone. Close follow-up. He asks for follow-up in 1 week, and agrees to return sooner if his symptoms are not improving. We need to get the overnight oximetry results, as he likely needs home O2.

## 2017-09-18 NOTE — Assessment & Plan Note (Signed)
Recently worse, as his wife was out of town recently and not there to cut his alcohol with water, and not there to cut him off at the agreed upon amount. COncern for liver disease, but he declines evaluation.

## 2017-09-18 NOTE — Assessment & Plan Note (Signed)
No plans to quit, but has had to cut back. Encouraged him not to escalate again.

## 2017-09-18 NOTE — Telephone Encounter (Signed)
[facility] will send the results and I will place them in your box when received.

## 2017-09-19 DIAGNOSIS — R0902 Hypoxemia: Secondary | ICD-10-CM | POA: Insufficient documentation

## 2017-09-19 NOTE — Addendum Note (Signed)
Addended by: Fara Chute on: 09/19/2017 06:10 PM   Modules accepted: Orders

## 2017-09-19 NOTE — Telephone Encounter (Addendum)
Overnight oximetry report received. O2 level ranged from 78-96%, with average of 87%. Of the nearly 6 hours of testing, the SpO2 was <88 83.18% of the time and below 89% 89.98% of the time.  Will order home O2. See DME Rx and fax (in [place] box)  Please advise patient to expect a call from [facility] for set up. If he doesn't hear from them in the next several days, please encourage him to contact them.

## 2017-09-26 ENCOUNTER — Ambulatory Visit: Payer: Self-pay | Admitting: Physician Assistant

## 2017-09-26 ENCOUNTER — Encounter: Payer: Self-pay | Admitting: Physician Assistant

## 2017-09-26 VITALS — BP 132/78 | HR 105 | Temp 98.0°F | Wt 303.4 lb

## 2017-09-26 DIAGNOSIS — K5900 Constipation, unspecified: Secondary | ICD-10-CM

## 2017-09-26 NOTE — Progress Notes (Signed)
Patient ID: Elijah Barton, male    DOB: 05-27-1956, 61 y.o.   MRN: [id number]  PCP: Elijah Mons, PA-C  Chief Complaint  Patient presents with  . Constipation    No bowel movement in four day.     Subjective:   Presents for evaluation of constipation.  He is accompanied by his girlfriend (whom I have mistaken as his wife at his visits prior to today, when she corrected me).  His last BM was 4 days ago. He has a near constant urge to defecate, that is nearly uncontrollable when he stands/walks. He has had only small amounts of stool, with significant straining, and has had some leakage of stool x 2, which he describes as "mud." Normal brown color. No melena/hematochezia.  No associated nausea or vomiting, increased belching, appetite reduction. Has tried increased water intake, prunes and oral stool softeners.  Difficulty starting his urinary stream and completely emptying his bladder. No dysuria, urinary incontinence.  I saw him 12/01 with COPD exacerbation and significant LE edema associated with weight gain. There was no radiographic evidence of CHF, but we started furosemide 20 mg daily. With the onset of the constipation, he stopped it over concern that he'd become dehydrated, and notes a 2 lbs weight gain. The cough and SOB has improved some. He did have the overnight oximetry, and I was able to obtain the report. I have ordered the home O2 concentrator, but he has yet to be contacted to set up.    Review of Systems As above. No fever, chills. No headache, dizziness.    Patient Active Problem List   Diagnosis Date Noted  . Hypoxia 09/19/2017  . 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 Alcohol abuse, daily use 10/12/2014  . COPD exacerbation ([place]) 09/02/2013  . BMI 37.0-37.9, adult 09/02/2013  . Nicotine addiction 09/02/2013  . Peripheral neuropathy 03/12/2013     Prior to Admission medications   Medication Sig Start Date End Date Taking? Authorizing Provider    albuterol (PROVENTIL HFA;VENTOLIN HFA) 108 (90 Base) MCG/ACT inhaler Inhale 2 puffs into the lungs every 4 (four) hours as needed for wheezing or shortness of breath (cough, shortness of breath or wheezing.). 06/28/17  Yes Elijah Pickney, PA-C  aspirin 81 MG tablet Take 81 mg by mouth daily.     Yes [provider]  benzonatate (TESSALON) 100 MG capsule Take 1-2 capsules (100-200 mg total) by mouth 3 (three) times daily as needed for cough. 09/16/17  Yes Elijah Capasso, PA-C  cyclobenzaprine (FLEXERIL) 10 MG tablet Take 1 tablet (10 mg total) by mouth 3 (three) times daily as needed for muscle spasms. 06/28/17  Yes Elijah Grundman, PA-C  doxycycline (VIBRAMYCIN) 100 MG capsule Take 1 capsule (100 mg total) by mouth 2 (two) times daily for 10 days. 09/16/17 09/26/17 Yes Elijah Hamrick, PA-C  fish oil-omega-3 fatty acids 1000 MG capsule Take 1 g by mouth daily.     Yes [provider]  gabapentin (NEURONTIN) 100 MG capsule Take 1 capsule (100 mg total) by mouth 3 (three) times daily. 06/28/17  Yes Elijah Bonura, PA-C  Guaifenesin (MUCINEX MAXIMUM STRENGTH) 1200 MG TB12 Take 1 tablet (1,200 mg total) by mouth every 12 (twelve) hours as needed. 06/28/17  Yes Elijah Sipp, PA-C  magnesium 30 MG tablet Take 30 mg by mouth daily.     Yes [provider]  Multiple Vitamin (MULTIVITAMIN) tablet Take 1 tablet by mouth daily.     Yes [provider]  NON FORMULARY OverNight  Oximetry 07/14/17  Yes Elijah Slutsky, PA-C  polycarbophil (FIBERCON) 625 MG tablet Take 625 mg by mouth daily.   Yes [provider]  predniSONE (DELTASONE) 20 MG tablet Take 3 PO QAM x3days, 2 PO QAM x3days, 1 PO QAM x3days 09/16/17  Yes Elijah Mathisen, PA-C  Probiotic Product (PROBIOTIC FORMULA PO) Take by mouth daily.     Yes [provider]     Allergies  Allergen Reactions  . Penicillins        Objective:  Physical Exam  Constitutional: He is oriented to person, place,  and time. He appears well-developed and well-nourished. He is active and cooperative. No distress.  BP 132/78 (BP Location: Left Arm, Patient Position: Sitting, Cuff Size: Large)   Pulse (!) 105   Temp 98 F (36.7 C) (Oral)   Wt (!) 303 lb 6.4 oz (137.6 kg)   SpO2 91%   BMI 40.58 kg/m   HENT:  Head: Normocephalic and atraumatic.  Right Ear: Hearing normal.  Left Ear: Hearing normal.  Mouth/Throat: Oropharynx is clear and moist. No oropharyngeal exudate.  Eyes: Conjunctivae are normal. No scleral icterus.  Neck: Normal range of motion. Neck supple. No thyromegaly present.  Cardiovascular: Normal rate, regular rhythm and normal heart sounds.  Pulses:      Radial pulses are 2+ on the right side, and 2+ on the left side.  Pulmonary/Chest: Effort normal and breath sounds normal.  Abdominal: Soft. Normal appearance. He exhibits no shifting dullness, no distension, no pulsatile liver, no ascites, no pulsatile midline mass and no mass. Bowel sounds are decreased. There is tenderness (palpation in the suprapubic area causes strong urge to defecate, no pain). There is no CVA tenderness.  Protuberant abdomen. Rectal exam reveals no stool in the vault, but large, hard stool at the finger tip. No masses.  Lymphadenopathy:       Head (right side): No tonsillar, no preauricular, no posterior auricular and no occipital adenopathy present.       Head (left side): No tonsillar, no preauricular, no posterior auricular and no occipital adenopathy present.    He has no cervical adenopathy.       Right: No supraclavicular adenopathy present.       Left: No supraclavicular adenopathy present.  Neurological: He is alert and oriented to person, place, and time. No sensory deficit.  Skin: Skin is warm, dry and intact. No rash noted. No cyanosis or erythema. Nails show no clubbing.  Psychiatric: He has a normal mood and affect. His speech is normal and behavior is normal.   Unable to perform radiographs  today.        Assessment & Plan:  1. Constipation, unspecified constipation type Clean out with OTC Miralax. Continue high fiber foods. RTC or go to ED if develops abdominal pain, nausea/vomiting, fever.    Return if symptoms worsen or fail to improve.   Fara Chute, PA-C [facility] at [facility] [place]

## 2017-09-26 NOTE — Patient Instructions (Signed)
HOW TO GET YOUR KID UNCLOGGED ASAP (copied from It's No Accident by Dr. Nyra Capes, pp. [phone])  Initial Clean Out: Start this process on a Friday night, so you have the entire weekend to get the job done. For children lighter than 45 pounds, mix seven doses of MiraLAX or generic brand powder (one dose is a cap filled to the line) in 32 ounces of Gatorade, Pedialyte, or other clear, noncarbonated liquid. Gatorade and Pedialyte help prevent dehydration because they contain electrolytes.  Plus, they taste good, so kids may be more motivated to drink the full dose.  Have your child drink the entire bottle over twenty-four hours. She can eat anything she wants, but should drink only this fluid. Kids weighing between forty-five and eighty pounds should get ten doses, and those weighing at least eighty pounds should receive fourteen doses of laxative in 64 ounces of fluid over the first twenty-four hours.  Don't give her less than this amount! You may even need to give her more.  The endpoint you're shooting for is watery poop.  Your child should pass five or six stools within twenty-four to forty-eight hours.  If she doesn't, keep her on the maintenance dose described below and try the initial clean out program again the next weekend, keeping her on a daily capful of MiraLAX during the week.    ALSO, try a couple of saline enemas as well.

## 2017-09-27 ENCOUNTER — Telehealth: Payer: Self-pay | Admitting: Physician Assistant

## 2017-09-27 NOTE — Telephone Encounter (Signed)
Orders faxed

## 2017-09-27 NOTE — Telephone Encounter (Signed)
Copied from [place]. Topic: Inquiry >> Sep 27, 2017  3:57 PM Corie Chiquito, [place] wrote: Reason for CRM: [name] from [facility] called because they need a liter flow on how much O2 the patient needs. The O &O is out of date a needs to be within the last 30 days and does the patient has any insurance. If someone could please give they a call back at [phone] ext. [phone]

## 2017-09-27 NOTE — Telephone Encounter (Signed)
Copied from [place]. Topic: Quick Communication - See Telephone Encounter >> Sep 27, 2017 12:31 PM Robina Ade, Helene Kelp D wrote: CRM for notification. See Telephone encounter for: 09/27/17. Patient called and wanted to know the status of an order for his oxygen that was sent from [facility]. He spoke with them and they told him that they have not received the order from the provider. Please send order. Their number is [phone] and call patient when this is done, thanks.

## 2017-09-27 NOTE — Telephone Encounter (Signed)
The order was placed in the telephone message of 09/16/2017 related to the overnight oximetry results.  Reprinted order.

## 2017-09-27 NOTE — Telephone Encounter (Signed)
Error-Close Encounter 

## 2017-09-27 NOTE — Telephone Encounter (Signed)
Please advice. I don't have any orders to fax.

## 2017-09-27 NOTE — Telephone Encounter (Signed)
Left note for Nira Conn to fax order today  to [facility] [phone] and call and advise pt

## 2017-09-28 ENCOUNTER — Telehealth: Payer: Self-pay | Admitting: Physician Assistant

## 2017-09-28 ENCOUNTER — Telehealth: Payer: Self-pay

## 2017-09-28 DIAGNOSIS — R0902 Hypoxemia: Secondary | ICD-10-CM

## 2017-09-28 NOTE — Telephone Encounter (Signed)
Copied from [place]. Topic: Inquiry >> Sep 27, 2017  3:57 PM Corie Chiquito, [place] wrote: Reason for CRM: [name] from [facility] called because they need a liter flow on how much O2 the patient needs. The O &O is out of date a needs to be within the last 30 days and does the patient has any insurance. If someone could please give they a call back at [phone] ext. [phone]  >> Sep 28, 2017 10:55 AM [name] wrote: [name] - [phone] a month rent.  Pt cannot afford this. He wants to speak with Dr Dellis Filbert or her nurse to possibly get device off of [place] which is FDA cleared for $400 Pt is possibly needing an order for the [employer].  Needs to speak with someone ASAP - not in danger -has inhaler   Call Pt at: [phone] (anytime) >> Sep 28, 2017 11:02 AM [name] wrote: [name] from [facility] ext [phone] called saying needs to have the order updated with how many Liters are needed.  The order does not state.

## 2017-09-28 NOTE — Telephone Encounter (Signed)
Multiple messages re: 30 Sent to [facility] per Jenny Reichmann.

## 2017-09-28 NOTE — Telephone Encounter (Unsigned)
Copied from [place]. Topic: Inquiry >> Sep 27, 2017  3:57 PM Corie Chiquito, [place] wrote: Reason for CRM: [name] from [facility] called because they need a liter flow on how much O2 the patient needs. The O &O is out of date a needs to be within the last 30 days and does the patient has any insurance. If someone could please give they a call back at [phone] ext. [phone]

## 2017-09-28 NOTE — Telephone Encounter (Addendum)
1. O2 at 4 LPM 2. I am happy to order the device from wherever he chooses to obtain it. In fact. My original order (see the phone message 09/16/2017) could be reprinted and sent with his [place] order, though we'd need to add the flow rate of 4 LPM.

## 2017-09-28 NOTE — Telephone Encounter (Signed)
This has already been taking care of  °

## 2017-09-28 NOTE — Telephone Encounter (Signed)
Pt has a question regarding an oxygen concentrator below. Pt doesn't have insurance and has already spoken with [facility].

## 2017-09-28 NOTE — Telephone Encounter (Signed)
Attempted to [name] from [place] regarding his request for information; left message on voicemail [phone] ext.[phone].; see CRM # [id number]; will route to [place] pool high priority

## 2017-09-28 NOTE — Telephone Encounter (Signed)
Please look into this. Multiple messages on this and I'm not sure if this has already been resolved.

## 2017-10-05 NOTE — Telephone Encounter (Signed)
Pt is calling back in regards to the oxygen concentrator. He said that [facility] said they want $160 a month for it & he said that he can get a brand new one for about $400 on [place] brand new. Please call patient back @ [phone]

## 2017-10-13 NOTE — Telephone Encounter (Signed)
Called pt about the oxygen concentrator and pt states he needs a printed Rx so he can take it to "where ever he decides to go" to get an oxygen concentrator. Pt states $160 for a used one from [facility] is ridiculous and would rather purchase is own.

## 2017-10-14 MED ORDER — NON FORMULARY: 1 refills | Status: DC

## 2017-10-14 NOTE — Telephone Encounter (Signed)
Rx printed. Notify patient for pick up.

## 2017-10-14 NOTE — Addendum Note (Signed)
Addended by: Fara Chute on: 10/14/2017 08:13 AM   Modules accepted: Orders

## 2017-12-13 ENCOUNTER — Telehealth: Payer: Self-pay | Admitting: Physician Assistant

## 2017-12-13 NOTE — Telephone Encounter (Signed)
Copied from [place]. Topic: Quick Communication - See Telephone Encounter >> Dec 13, 2017  6:16 PM Ivar Drape wrote: CRM for notification. See Telephone encounter for: Patient needs a prescription for Lasix.  He would like that sent to his preferred pharmacy [facility] on [address]       12/13/17.

## 2017-12-16 NOTE — Telephone Encounter (Signed)
Pt message request for Lasix sent to [facility].

## 2017-12-17 NOTE — Telephone Encounter (Signed)
Please advise patient that he needs visit if he thinks that he needs furosemide. Bring his home weight measurements with him.

## 2017-12-18 NOTE — Telephone Encounter (Signed)
Called pt to have him make an appt with Harrison Mons and bring in his weight measurements with him to the appt.   Pt stated that he will call in later today to make an appt. Please make sure and tell him to bring those weight measurements with him to his appt.  Thanks!

## 2018-01-16 ENCOUNTER — Telehealth: Payer: Self-pay | Admitting: *Deleted

## 2018-01-16 NOTE — Telephone Encounter (Signed)
Prescription not picked up [facility]

## 2018-01-22 ENCOUNTER — Encounter: Payer: Self-pay | Admitting: Physician Assistant

## 2018-03-16 ENCOUNTER — Encounter: Payer: Self-pay | Admitting: Physician Assistant

## 2018-03-16 ENCOUNTER — Ambulatory Visit: Payer: Self-pay | Admitting: Physician Assistant

## 2018-03-16 ENCOUNTER — Other Ambulatory Visit: Payer: Self-pay

## 2018-03-16 VITALS — BP 134/82 | HR 92 | Temp 98.7°F | Resp 18 | Ht 72.5 in | Wt 297.4 lb

## 2018-03-16 DIAGNOSIS — F17200 Nicotine dependence, unspecified, uncomplicated: Secondary | ICD-10-CM

## 2018-03-16 DIAGNOSIS — J441 Chronic obstructive pulmonary disease with (acute) exacerbation: Secondary | ICD-10-CM

## 2018-03-16 MED ORDER — ALBUTEROL SULFATE HFA 108 (90 BASE) MCG/ACT IN AERS: 2.0000 | INHALATION_SPRAY | Freq: Four times a day (QID) | RESPIRATORY_TRACT | 0 refills | Status: DC | PRN

## 2018-03-16 MED ORDER — AZITHROMYCIN 250 MG PO TABS: ORAL_TABLET | ORAL | 0 refills | Status: DC

## 2018-03-16 MED ORDER — PREDNISONE 20 MG PO TABS: ORAL_TABLET | ORAL | 0 refills | Status: DC

## 2018-03-16 NOTE — Patient Instructions (Addendum)
  Purchase a HIGH dose B vitamin and take one daily.  Folate and B12  Lab Results  Component Value Date   ALT 22 09/16/2017   AST 29 09/16/2017   ALKPHOS 85 09/16/2017   BILITOT 1.0 09/16/2017   Lab Results  Component Value Date   WBC 6.9 09/16/2017   HGB 17.1 09/16/2017   HCT 48.4 09/16/2017   MCV 101.0 (A) 09/16/2017   Lab Results  Component Value Date   CREATININE 0.83 09/16/2017   BUN 5 (L) 09/16/2017   NA 140 09/16/2017   K 5.6 (H) 09/16/2017   CL 94 (L) 09/16/2017   CO2 28 09/16/2017         IF you received an x-ray today, you will receive an invoice from [facility]. Please contact [facility] at [phone] with questions or concerns regarding your invoice.   IF you received labwork today, you will receive an invoice from [place]. Please contact [facility] at [phone] with questions or concerns regarding your invoice.   Our billing staff will not be able to assist you with questions regarding bills from these companies.  You will be contacted with the lab results as soon as they are available. The fastest way to get your results is to activate your My Chart account. Instructions are located on the last page of this paperwork. If you have not heard from [place] regarding the results in 2 weeks, please contact this office.

## 2018-03-16 NOTE — Progress Notes (Signed)
03/17/2018 8:17 AM   DOB: 06-Nov-1955 / MRN: [id number]  SUBJECTIVE:  Elijah Barton is a 62 y.o. male presenting for cough productive of green sputum, wheezing, shortness of breath.  Patient has a long history of heavy smoking and prediabetes.  He has history of COPD and has been prescribed oxygen in the past.  He denies chest pain.  He has several issues he wants to discuss today.  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  He is not taking the vitamins as prescribed in the past.  He has no plans to limit his drinking.  He drinks up to at least 1/5 a day and his wife helps mediate his alcohol consumption.  He continues to smoke 1 to 2 packs a day.  He fully understands the implications of these health habits as they relate to his overall condition. However weight   He is allergic to penicillins.   He  has a past medical history of Back pain, Joint pain, and Peripheral neuropathy.    He  reports that he has been smoking cigarettes.  He has a 80.00 pack-year smoking history. He has never used smokeless tobacco. He reports that he drinks about 7.5 oz of alcohol per week. He reports that he does not use drugs. He  reports that he currently engages in sexual activity. He reports using the following method of birth control/protection: Condom. The patient  has a past surgical history that includes Lymph node biopsy and Tonsillectomy and adenoidectomy.  His family history includes Alcohol abuse in his father; Cancer in his mother; Cirrhosis in his father; Colon cancer in his mother; Heart disease in his mother; Stroke in his mother.  Review of Systems  Constitutional: Negative for chills, diaphoresis and fever.  Eyes: Negative.   Respiratory: Positive for cough and shortness of breath. Negative for hemoptysis, sputum production and wheezing.   Cardiovascular: Negative for chest pain, orthopnea and leg swelling.  Gastrointestinal: Negative for nausea.  Skin:  Negative for rash.  Neurological: Negative for dizziness, sensory change, speech change, focal weakness and headaches.    The problem list and medications were reviewed and updated by myself where necessary.  OBJECTIVE:  BP 134/82 (BP Location: Left Arm, Patient Position: Sitting, Cuff Size: Large)   Pulse 92   Temp 98.7 F (37.1 C) (Oral)   Resp 18   Ht 6' 0.5" (1.842 m)   Wt 297 lb 6.4 oz (134.9 kg)   SpO2 94%   BMI 39.78 kg/m   Physical Exam  Constitutional: He is oriented to person, place, and time. He appears well-developed. He does not appear ill.  Eyes: Pupils are equal, round, and reactive to light. Conjunctivae and EOM are normal.  Cardiovascular: Normal rate.  Pulmonary/Chest: Effort normal. No stridor. No respiratory distress. He has wheezes. He has no rales. He exhibits no tenderness.  Abdominal: He exhibits no distension.  Musculoskeletal: Normal range of motion.  Neurological: He is alert and oriented to person, place, and time. No cranial nerve deficit. Coordination normal.  Skin: Skin is warm and dry. He is not diaphoretic.  Psychiatric: He has a normal mood and affect.  Nursing note and vitals reviewed.   No results found for this or any previous visit (from the past 72 hour(s)).  No results found.  ASSESSMENT AND PLAN:  Elijah Barton was seen today for chest congestion.  Diagnoses and all orders for this visit:  COPD exacerbation ([name]): Blue bloater.  Has  a history of elevated glucose on CMP however these have been mild and actually in the prediabetic range.  He needs prednisone and antibiotic.  She will has prescribed him oxygen in the past however he could not afford it.  Says that his wife's disability check is come in and now they are able to afford better medical care.  He would like a refill of his albuterol as well. -     azithromycin (ZITHROMAX) 250 MG tablet; Take two tabs today and then one daily thereafter. -     predniSONE (DELTASONE) 20 MG tablet;  Take 3 PO QAM x3days, 2 PO QAM x3days, 1 PO QAM x3days -     albuterol (PROVENTIL HFA;VENTOLIN HFA) 108 (90 Base) MCG/ACT inhaler; Inhale 2 puffs into the lungs every 6 (six) hours as needed for wheezing or shortness of breath. -     For home use only DME oxygen    The patient is advised to call or return to clinic if he does not see an improvement in symptoms, or to seek the care of the closest emergency department if he worsens with the above plan.   Philis Fendt, MHS, PA-C [facility] 03/17/2018 8:17 AM

## 2018-05-04 ENCOUNTER — Emergency Department (HOSPITAL_COMMUNITY): Payer: Self-pay

## 2018-05-04 ENCOUNTER — Ambulatory Visit: Payer: Self-pay | Admitting: Physician Assistant

## 2018-05-04 ENCOUNTER — Emergency Department (HOSPITAL_COMMUNITY)
Admission: EM | Admit: 2018-05-04 | Discharge: 2018-05-04 | Disposition: A | Discharge status: Home / Self Care | Payer: Self-pay | Attending: Emergency Medicine | Admitting: Emergency Medicine

## 2018-05-04 ENCOUNTER — Encounter: Payer: Self-pay | Admitting: Physician Assistant

## 2018-05-04 ENCOUNTER — Other Ambulatory Visit: Payer: Self-pay

## 2018-05-04 ENCOUNTER — Encounter (HOSPITAL_COMMUNITY): Payer: Self-pay

## 2018-05-04 VITALS — BP 130/86 | HR 97 | Temp 98.2°F | Resp 16 | Ht 71.0 in | Wt 292.2 lb

## 2018-05-04 DIAGNOSIS — Z7982 Long term (current) use of aspirin: Secondary | ICD-10-CM | POA: Insufficient documentation

## 2018-05-04 DIAGNOSIS — J449 Chronic obstructive pulmonary disease, unspecified: Secondary | ICD-10-CM | POA: Insufficient documentation

## 2018-05-04 DIAGNOSIS — R319 Hematuria, unspecified: Secondary | ICD-10-CM

## 2018-05-04 DIAGNOSIS — R82998 Other abnormal findings in urine: Secondary | ICD-10-CM

## 2018-05-04 DIAGNOSIS — F1020 Alcohol dependence, uncomplicated: Secondary | ICD-10-CM

## 2018-05-04 DIAGNOSIS — R112 Nausea with vomiting, unspecified: Secondary | ICD-10-CM

## 2018-05-04 DIAGNOSIS — M545 Low back pain, unspecified: Secondary | ICD-10-CM

## 2018-05-04 DIAGNOSIS — R3989 Other symptoms and signs involving the genitourinary system: Secondary | ICD-10-CM

## 2018-05-04 DIAGNOSIS — R109 Unspecified abdominal pain: Secondary | ICD-10-CM

## 2018-05-04 DIAGNOSIS — F17210 Nicotine dependence, cigarettes, uncomplicated: Secondary | ICD-10-CM | POA: Insufficient documentation

## 2018-05-04 DIAGNOSIS — R1030 Lower abdominal pain, unspecified: Secondary | ICD-10-CM | POA: Insufficient documentation

## 2018-05-04 DIAGNOSIS — Z79899 Other long term (current) drug therapy: Secondary | ICD-10-CM | POA: Insufficient documentation

## 2018-05-04 HISTORY — DX: Chronic obstructive pulmonary disease, unspecified: J44.9

## 2018-05-04 HISTORY — DX: Fatty (change of) liver, not elsewhere classified: K76.0

## 2018-05-04 LAB — POCT CBC
Granulocyte percent: 81.3 %G — AB (ref 37–80)
HCT, POC: 50.1 % (ref 43.5–53.7)
Hemoglobin: 17.2 g/dL (ref 14.1–18.1)
LYMPH, POC: 1.3 (ref 0.6–3.4)
MCH, POC: 34 pg — AB (ref 27–31.2)
MCHC: 34.3 g/dL (ref 31.8–35.4)
MCV: 99 fL — AB (ref 80–97)
MID (cbc): 0.2 (ref 0–0.9)
MPV: 7.9 fL (ref 0–99.8)
POC Granulocyte: 6.8 (ref 2–6.9)
POC LYMPH PERCENT: 16 %L (ref 10–50)
POC MID %: 2.7 %M (ref 0–12)
Platelet Count, POC: 167 10*3/uL (ref 142–424)
RBC: 5.06 M/uL (ref 4.69–6.13)
RDW, POC: 13.5 %
WBC: 8.4 10*3/uL (ref 4.6–10.2)

## 2018-05-04 LAB — POCT URINALYSIS DIP (MANUAL ENTRY)
Blood, UA: NEGATIVE
GLUCOSE UA: NEGATIVE mg/dL
LEUKOCYTES UA: NEGATIVE
Nitrite, UA: NEGATIVE
SPEC GRAV UA: 1.025 (ref 1.010–1.025)
UROBILINOGEN UA: 2 U/dL — AB
pH, UA: 6.5 (ref 5.0–8.0)

## 2018-05-04 LAB — COMPREHENSIVE METABOLIC PANEL
ALBUMIN: 3.5 g/dL (ref 3.5–5.0)
ALK PHOS: 65 U/L (ref 38–126)
ALT: 24 U/L (ref 0–44)
AST: 27 U/L (ref 15–41)
Anion gap: 9 (ref 5–15)
BILIRUBIN TOTAL: 1.7 mg/dL — AB (ref 0.3–1.2)
BUN: 7 mg/dL — AB (ref 8–23)
CALCIUM: 8.7 mg/dL — AB (ref 8.9–10.3)
CO2: 28 mmol/L (ref 22–32)
Chloride: 99 mmol/L (ref 98–111)
Creatinine, Ser: 0.71 mg/dL (ref 0.61–1.24)
GFR calc Af Amer: >60 mL/min (ref >60)
GFR calc non Af Amer: >60 mL/min (ref >60)
GLUCOSE: 113 mg/dL — AB (ref 70–99)
Potassium: 3.9 mmol/L (ref 3.5–5.1)
Sodium: 136 mmol/L (ref 135–145)
TOTAL PROTEIN: 6.8 g/dL (ref 6.5–8.1)

## 2018-05-04 LAB — CBC WITH DIFFERENTIAL/PLATELET
BASOS ABS: 0 10*3/uL (ref 0.0–0.1)
BASOS PCT: 0 %
Eosinophils Absolute: 0 10*3/uL (ref 0.0–0.7)
Eosinophils Relative: 0 %
HEMATOCRIT: 47.6 % (ref 39.0–52.0)
HEMOGLOBIN: 16.6 g/dL (ref 13.0–17.0)
LYMPHS PCT: 15 %
Lymphs Abs: 1.2 10*3/uL (ref 0.7–4.0)
MCH: 35.7 pg — ABNORMAL HIGH (ref 26.0–34.0)
MCHC: 34.9 g/dL (ref 30.0–36.0)
MCV: 102.4 fL — ABNORMAL HIGH (ref 78.0–100.0)
MONOS PCT: 8 %
Monocytes Absolute: 0.7 10*3/uL (ref 0.1–1.0)
NEUTROS ABS: 6.3 10*3/uL (ref 1.7–7.7)
NEUTROS PCT: 77 %
Platelets: 174 10*3/uL (ref 150–400)
RBC: 4.65 MIL/uL (ref 4.22–5.81)
RDW: 13.1 % (ref 11.5–15.5)
WBC: 8.3 10*3/uL (ref 4.0–10.5)

## 2018-05-04 LAB — URINALYSIS, ROUTINE W REFLEX MICROSCOPIC
Bilirubin Urine: NEGATIVE
Glucose, UA: NEGATIVE mg/dL
Hgb urine dipstick: NEGATIVE
KETONES UR: 5 mg/dL — AB
LEUKOCYTES UA: NEGATIVE
NITRITE: NEGATIVE
PROTEIN: NEGATIVE mg/dL
Specific Gravity, Urine: 1.015 (ref 1.005–1.030)
pH: 6 (ref 5.0–8.0)

## 2018-05-04 LAB — LIPASE, BLOOD: Lipase: 23 U/L (ref 11–51)

## 2018-05-04 LAB — POC MICROSCOPIC URINALYSIS (UMFC)

## 2018-05-04 MED ORDER — THIAMINE HCL 100 MG/ML IJ SOLN
Freq: Once | INTRAVENOUS | Status: AC
  Administered 2018-05-04: 16:00:00 via INTRAVENOUS
  Filled 2018-05-04: qty 1000

## 2018-05-04 MED ORDER — MORPHINE SULFATE (PF) 4 MG/ML IV SOLN
4.0000 mg | Freq: Once | INTRAVENOUS | Status: AC
  Administered 2018-05-04: 4 mg via INTRAVENOUS
  Filled 2018-05-04: qty 1

## 2018-05-04 MED ORDER — IOPAMIDOL (ISOVUE-300) INJECTION 61%
INTRAVENOUS | Status: AC
  Filled 2018-05-04: qty 100

## 2018-05-04 MED ORDER — NAPROXEN 500 MG PO TABS: 500.0000 mg | ORAL_TABLET | Freq: Two times a day (BID) | ORAL | 0 refills | Status: DC

## 2018-05-04 MED ORDER — METHOCARBAMOL 500 MG PO TABS: 500.0000 mg | ORAL_TABLET | Freq: Three times a day (TID) | ORAL | 0 refills | Status: AC | PRN

## 2018-05-04 MED ORDER — IOPAMIDOL (ISOVUE-300) INJECTION 61%
100.0000 mL | Freq: Once | INTRAVENOUS | Status: AC | PRN
  Administered 2018-05-04: 100 mL via INTRAVENOUS

## 2018-05-04 NOTE — Progress Notes (Signed)
05/04/2018 11:54 AM   DOB: [birth year] / MRN: [id number]  SUBJECTIVE:  Elijah Barton is a 62 y.o. male presenting for discolored urine along with bilateral flank pain right worse than left. Symptoms present for about 4 days..  The problem is worsening. He has tried nothing for the problem.  Long history of alcoholism with no plans to quit and somewhat worse lately. No frank abdominal pain.  No history of prostatitis.  No history of kidney stone.   He is allergic to penicillins.   He  has a past medical history of Back pain, Joint pain, and Peripheral neuropathy.    He  reports that he has been smoking cigarettes.  He has a 80.00 pack-year smoking history. He has never used smokeless tobacco. He reports that he drinks about 9.0 oz of alcohol per week. He reports that he does not use drugs. He  reports that he currently engages in sexual activity. He reports using the following method of birth control/protection: Condom. The patient  has a past surgical history that includes Lymph node biopsy and Tonsillectomy and adenoidectomy.  His family history includes Alcohol abuse in his father; Cancer in his mother; Cirrhosis in his father; Colon cancer in his mother; Heart disease in his mother; Stroke in his mother.  Review of Systems  Constitutional: Negative for chills, diaphoresis and fever.  Eyes: Negative.   Respiratory: Negative for cough, hemoptysis, sputum production, shortness of breath and wheezing.   Cardiovascular: Negative for chest pain, orthopnea and leg swelling.  Gastrointestinal: Positive for abdominal pain, nausea and vomiting. Negative for blood in stool, constipation, diarrhea, heartburn and melena.  Genitourinary: Positive for flank pain and hematuria. Negative for dysuria, frequency and urgency.  Skin: Negative for rash.  Neurological: Negative for dizziness, sensory change, speech change, focal weakness and headaches.    The problem list and medications were reviewed and  updated by myself where necessary and exist elsewhere in the encounter.   OBJECTIVE:  BP 130/86 (BP Location: Right Arm, Patient Position: Sitting, Cuff Size: Large)   Pulse 97   Temp 98.2 F (36.8 C) (Oral)   Resp 16   Ht 5\' 11"  (1.803 m)   Wt 292 lb 3.2 oz (132.5 kg)   SpO2 95%   BMI 40.75 kg/m   Wt Readings from Last 3 Encounters:  05/04/18 292 lb 3.2 oz (132.5 kg)  03/16/18 297 lb 6.4 oz (134.9 kg)  09/26/17 (!) 303 lb 6.4 oz (137.6 kg)   Temp Readings from Last 3 Encounters:  05/04/18 98.2 F (36.8 C) (Oral)  03/16/18 98.7 F (37.1 C) (Oral)  09/26/17 98 F (36.7 C) (Oral)   BP Readings from Last 3 Encounters:  05/04/18 130/86  03/16/18 134/82  09/26/17 132/78   Pulse Readings from Last 3 Encounters:  05/04/18 97  03/16/18 92  09/26/17 (!) 105    Physical Exam  Constitutional: He is oriented to person, place, and time. He appears well-developed. He does not appear ill.  Eyes: Pupils are equal, round, and reactive to light. Conjunctivae and EOM are normal.  Cardiovascular: Normal rate, regular rhythm, S1 normal, S2 normal, normal heart sounds, intact distal pulses and normal pulses. Exam reveals no gallop and no friction rub.  No murmur heard. Pulmonary/Chest: Effort normal. No stridor. No respiratory distress. He has no wheezes. He has no rales.  Abdominal: Soft. Normal appearance and bowel sounds are normal. He exhibits no distension and no mass. There is tenderness (generalized, worst in the left lower  quadrat). There is CVA tenderness. There is no rigidity, no rebound and no guarding. No hernia.  Musculoskeletal: Normal range of motion. He exhibits no edema.  Neurological: He is alert and oriented to person, place, and time. No cranial nerve deficit. Coordination normal.  Skin: Skin is warm and dry. He is not diaphoretic.  Psychiatric: He has a normal mood and affect.  Nursing note and vitals reviewed.   Lab Results  Component Value Date   HGBA1C 5.0  03/17/2012   Results for orders placed or performed in visit on 05/04/18  POCT urinalysis dipstick  Result Value Ref Range   Color, UA red (A) yellow   Clarity, UA clear clear   Glucose, UA negative negative mg/dL   Bilirubin, UA moderate (A) negative   Ketones, POC UA small (15) (A) negative mg/dL   Spec Grav, UA 1.025 1.010 - 1.025   Blood, UA negative negative   pH, UA 6.5 5.0 - 8.0   Protein Ur, POC =30 (A) negative mg/dL   Urobilinogen, UA 2.0 (A) 0.2 or 1.0 E.U./dL   Nitrite, UA Negative Negative   Leukocytes, UA Negative Negative  POCT Microscopic Urinalysis ([facility])  Result Value Ref Range   WBC,UR,HPF,POC None None WBC/hpf   RBC,UR,HPF,POC None None RBC/hpf   Bacteria Few (A) None, Too numerous to count   Mucus Present (A) Absent   Epithelial Cells, UR Per Microscopy Moderate (A) None, Too numerous to count cells/hpf  POCT CBC  Result Value Ref Range   WBC 8.4 4.6 - 10.2 K/uL   Lymph, poc 1.3 0.6 - 3.4   POC LYMPH PERCENT 16.0 10 - 50 %L   MID (cbc) 0.2 0 - 0.9   POC MID % 2.7 0 - 12 %M   POC Granulocyte 6.8 2 - 6.9   Granulocyte percent 81.3 (A) 37 - 80 %G   RBC 5.06 4.69 - 6.13 M/uL   Hemoglobin 17.2 14.1 - 18.1 g/dL   HCT, POC 50.1 43.5 - 53.7 %   MCV 99.0 (A) 80 - 97 fL   MCH, POC 34.0 (A) 27 - 31.2 pg   MCHC 34.3 31.8 - 35.4 g/dL   RDW, POC 13.5 %   Platelet Count, POC 167 142 - 424 K/uL   MPV 7.9 0 - 99.8 fL       ASSESSMENT AND PLAN:  Elijah Barton was seen today for back pain, urine and knees.  Diagnoses and all orders for this visit:  Abnormal urine color: Flank pain without blood or leuks with ketones in the setting of a heavy drinker. White count not impressive however symptomatically he probably needs a CT scan and IV fluids at the minimum. He will go to [facility] ED for further care of this problem.   -     POCT urinalysis dipstick -     POCT urinalysis dipstick -     POCT Microscopic Urinalysis ([facility]) -     Urine Culture -     POCT CBC -      PSA  Dark urine -     POCT urinalysis dipstick -     POCT Microscopic Urinalysis ([facility]) -     Urine Culture -     CMP and Liver -     Lipase    The patient is advised to call or return to clinic if he does not see an improvement in symptoms, or to seek the care of the closest emergency department if he worsens with the above  plan.   Philis Fendt, MHS, PA-C [facility] 05/04/2018 11:54 AM

## 2018-05-04 NOTE — ED Triage Notes (Signed)
Patient c/o intermittent right lower back  pain x 1 week. Patient went to his PCP today.  Patient states the pain is worse when he moves the wrong way. Patient states he was sent to the ED, but wanted to make sure he did not have a kidney stone.

## 2018-05-04 NOTE — Patient Instructions (Addendum)
Please go to [facility] for rfurther diagnosis and treatment.     IF you received an x-ray today, you will receive an invoice from [facility]. Please contact [facility] at [phone] with questions or concerns regarding your invoice.   IF you received labwork today, you will receive an invoice from [facility]. Please contact [facility] at [phone] with questions or concerns regarding your invoice.   Our billing staff will not be able to assist you with questions regarding bills from these companies.  You will be contacted with the lab results as soon as they are available. The fastest way to get your results is to activate your My Chart account. Instructions are located on the last page of this paperwork. If you have not heard from [place] regarding the results in 2 weeks, please contact this office.

## 2018-05-04 NOTE — Addendum Note (Signed)
Addended by: Ileana Roup on: 05/04/2018 02:47 PM   Modules accepted: Orders

## 2018-05-04 NOTE — ED Provider Notes (Signed)
[facility] DEPT Provider Note   CSN: [id number] Arrival date & time: 05/04/18  1233     History   Chief Complaint Chief Complaint  Patient presents with  . Back Pain    HPI Elijah Barton is a 62 y.o. male with history of COPD, ongoing tobacco use, EtOH abuse, fatty liver disease, peripheral neuropathy is here for evaluation of right flank pain.  Onset 10 days ago.  Worsening.  Described as sharp, stabbing intense that he feels like his knees are giving out.  Intermittently radiates to right lower abdomen/lateral hip. Worse with sitting up, standing up and certain movements as well as palpation.  He does not have any pain when he sits still.  He googled his symptoms and thinks he may have a kidney stone.  Associated with pungent, darker urine, nausea when the pain is severe.  Has had forceful and productive cough that he attributes to his COPD/smoking.  He drinks 500 to 750 mL of vodka a night.  His last drink was 11 PM yesterday.  Typically goes several hours without drinking.  Does get shakes but denies any seizures or any other withdrawal symptoms.   Chronic back pain for a long time, flank pain currently feels different.  One fall 1 month ago. No fevers, headaches, vomiting, chest pain, shortness of breath, dysuria, frequency, saddle anesthesia, bladder or bowel incontinence or retention, diarrhea, constipation, loss of sensation or heaviness/weakness to his legs.  Unchanged neuropathy to his feet.  States that he works outside in the heat several hours a day and does not think he drinks enough water.  Wife states that he has had one large meal in the last 3 days otherwise has not eaten very much.  Went to urgent care and was referred to ER for more evaluation.   HPI  Past Medical History:  Diagnosis Date  . Back pain   . COPD (chronic obstructive pulmonary disease) ([place])   . Fatty liver   . Joint pain    all over except for hip  . Peripheral neuropathy      Patient Active Problem List   Diagnosis Date Noted  . Hypoxia 09/19/2017  . 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 Alcohol abuse, daily use 10/12/2014  . COPD exacerbation ([place]) 09/02/2013  . BMI 37.0-37.9, adult 09/02/2013  . Nicotine addiction 09/02/2013  . Peripheral neuropathy 03/12/2013    Past Surgical History:  Procedure Laterality Date  . LYMPH NODE BIOPSY     neck  . TONSILLECTOMY AND ADENOIDECTOMY          Home Medications    Prior to Admission medications   Medication Sig Start Date End Date Taking? Authorizing Provider  albuterol (PROVENTIL HFA;VENTOLIN HFA) 108 (90 Base) MCG/ACT inhaler Inhale 2 puffs into the lungs every 6 (six) hours as needed for wheezing or shortness of breath. 03/16/18  Yes Tereasa Coop, PA-C  b complex vitamins tablet Take 2 tablets by mouth daily.    Yes [provider]  cyclobenzaprine (FLEXERIL) 10 MG tablet Take 1 tablet (10 mg total) by mouth 3 (three) times daily as needed for muscle spasms. 06/28/17  Yes Jeffery, Chelle, PA-C  diclofenac sodium (VOLTAREN) 1 % GEL Apply 2 g topically daily as needed (back pain).   Yes [provider]  gabapentin (NEURONTIN) 100 MG capsule Take 1 capsule (100 mg total) by mouth 3 (three) times daily. 06/28/17  Yes Jeffery, Chelle, PA-C  Multiple Vitamin (MULTIVITAMIN) tablet Take 1 tablet by mouth daily.  Yes [provider]  aspirin 81 MG tablet Take 81 mg by mouth daily.      [provider]  methocarbamol (ROBAXIN) 500 MG tablet Take 1 tablet (500 mg total) by mouth every 8 (eight) hours as needed for up to 3 days for muscle spasms. 05/04/18 05/07/18  Kinnie Feil, PA-C  naproxen (NAPROSYN) 500 MG tablet Take 1 tablet (500 mg total) by mouth 2 (two) times daily. 05/04/18   Kinnie Feil, PA-C  NON FORMULARY OverNight Oximetry 07/14/17   Harrison Mons, PA-C  NON FORMULARY Home oxygen concentrator 10/14/17   Harrison Mons, PA-C    Family History Family History    Problem Relation Age of Onset  . Colon cancer Mother   . Heart disease Mother   . Stroke Mother   . Cancer Mother        lung  . Cirrhosis Father   . Alcohol abuse Father     Social History Social History   Tobacco Use  . Smoking status: Current Every Day Smoker    Packs/day: 2.00    Years: 40.00    Pack years: 80.00    Types: Cigarettes  . Smokeless tobacco: Never Used  Substance Use Topics  . Alcohol use: Yes    Alcohol/week: 9.0 oz    Types: 15 Standard drinks or equivalent per week    Comment: 0-20 drinks/week.  . Drug use: No     Allergies   Penicillins   Review of Systems Review of Systems  Gastrointestinal: Positive for abdominal pain.  Genitourinary: Positive for flank pain.       Darker and malodorous urine  Musculoskeletal: Positive for back pain.  All other systems reviewed and are negative.    Physical Exam Updated Vital Signs BP (!) 146/88   Pulse 85   Temp 98.3 F (36.8 C) (Oral)   Resp (!) 23   Ht 5\' 11"  (1.803 m)   Wt 132.5 kg (292 lb)   SpO2 (!) 89%   BMI 40.73 kg/m   Physical Exam  Constitutional: He appears well-developed and well-nourished. No distress.  HENT:  Head: Normocephalic and atraumatic.  Nose: Nose normal.  Eyes: EOM are normal.  Neck:  C-spine: no midline cervical spine tenderness  Cardiovascular: Normal rate, S1 normal, S2 normal and normal heart sounds.  Pulses:      Radial pulses are 2+ on the right side, and 2+ on the left side.       Femoral pulses are 2+ on the right side, and 2+ on the left side.      Dorsalis pedis pulses are 1+ on the right side, and 1+ on the left side.  Trace pitting edema to lower tip/fib, symmetric.  No calf tenderness.  Pulmonary/Chest: Effort normal and breath sounds normal. He has no decreased breath sounds. He exhibits no tenderness.  Abdominal: Soft. Normal appearance and bowel sounds are normal. There is tenderness.  Right low CVA tenderness.  CVA also tender with deep  palpation.  Positive McBurney's.  Guarding.  Obese abdomen, question distention.  Possible ventral hernia with sitting up and coughing.  Active bowel sounds.  No rebound or rigidity.  No suprapubic tenderness.  Musculoskeletal: He exhibits tenderness.       Lumbar back: He exhibits tenderness and pain.  T-spine: No midline tenderness or paraspinal tenderness. L-spine: Mild low L-spine tenderness.  TTP at right paraspinal muscles and SI joint.  Patient needs assistance with sitting up and laying down as this exacerbates  his pain. Pelvis: No AP/lateral instability.  Full passive range of motion of hips without pain.  Neurological:  5/5 strength with flexion/extension of hip, knee and ankle, bilaterally.  Sensation to light touch intact in lower extremities including feet  Skin: Skin is warm and dry. Capillary refill takes less than 2 seconds.  Psychiatric: He has a normal mood and affect. His behavior is normal. Judgment and thought content normal.     ED Treatments / Results  Labs (all labs ordered are listed, but only abnormal results are displayed) Labs Reviewed  URINALYSIS, ROUTINE W REFLEX MICROSCOPIC - Abnormal; Notable for the following components:      Result Value   Color, Urine AMBER (*)    Ketones, ur 5 (*)    All other components within normal limits  CBC WITH DIFFERENTIAL/PLATELET - Abnormal; Notable for the following components:   MCV 102.4 (*)    MCH 35.7 (*)    All other components within normal limits  COMPREHENSIVE METABOLIC PANEL - Abnormal; Notable for the following components:   Glucose, Bld 113 (*)    BUN 7 (*)    Calcium 8.7 (*)    Total Bilirubin 1.7 (*)    All other components within normal limits  URINE CULTURE  LIPASE, BLOOD    EKG None  Radiology Ct Abdomen Pelvis W Contrast  Result Date: 05/04/2018 CLINICAL DATA:  Right flank pain for 1 week. EXAM: CT ABDOMEN AND PELVIS WITH CONTRAST TECHNIQUE: Multidetector CT imaging of the abdomen and pelvis  was performed using the standard protocol following bolus administration of intravenous contrast. CONTRAST:  161mL ISOVUE-300 IOPAMIDOL (ISOVUE-300) INJECTION 61% COMPARISON:  CT scan dated 05/03/2014 FINDINGS: Lower chest: No acute abnormality. Hepatobiliary: Hepatic steatosis. No focal liver lesions. Biliary tree is normal. Pancreas: Tiny calcification inferior aspect of the head of the pancreas, new since the prior study. This appears to be below the ampulla. Otherwise normal. Spleen: Normal. Adrenals/Urinary Tract: 17 mm peripelvic cysts in the inferior aspect of the right kidney. Kidneys and ureters and bladder are otherwise normal. Adrenal glands are normal. Slight chronic perinephric soft tissue stranding. Stomach/Bowel: Single diverticula in the cecum and in the descending colon. The appendix lies in the right mid abdomen and extends lateral to the inferior tip of the right lobe of the liver. Stomach appears normal. Vascular/Lymphatic: Aortic atherosclerosis. No enlarged abdominal or pelvic lymph nodes. Reproductive: Prostate is unremarkable. Other: No abdominal wall hernia or abnormality. No abdominopelvic ascites. Musculoskeletal: Slight arthritic changes of both hips. 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 IMPRESSION: 1. No acute abnormality of the abdomen or pelvis. 2. Aortic atherosclerosis. 3. 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 Electronically Signed   By: Lorriane Shire M.D.   On: 05/04/2018 17:42   Ct L-spine No Charge  Result Date: 05/04/2018 CLINICAL DATA:  Right-sided low back pain for 1 week. EXAM: CT LUMBAR SPINE WITHOUT CONTRAST TECHNIQUE: Multidetector CT imaging of the lumbar spine was performed without intravenous contrast administration. Multiplanar CT image reconstructions were also generated. COMPARISON:  CT scan of the abdomen dated 05/03/2014 FINDINGS: Segmentation: 5 lumbar type vertebrae.  Alignment: Normal. Vertebrae: No acute fracture or focal pathologic process. Paraspinal and other soft tissues: Aortic atherosclerosis. Disc levels: L1-2: Normal disc. Minimal degenerative changes of the facet joints. L2-3: Normal disc. Slight degenerative changes of the facet joints, right greater than left. L3-4: Normal disc.  Slight degenerative changes  of the facet joints. L4-5: Normal disc. Minimal degenerative changes of the facet joints. L5-S1: Small broad-based disc bulge with no neural impingement. Normal facet joints. IMPRESSION: 1. No acute abnormality of the lumbar spine. 2. Small broad-based disc bulge at L5-S1 without neural impingement. 3. Minimal degenerative changes of the facet joints in the lumbar spine. 4. Note is made of anterior osteophytes fusing both sacroiliac joints. Electronically Signed   By: Lorriane Shire M.D.   On: 05/04/2018 17:33    Procedures Procedures (including critical care time)  Medications Ordered in ED Medications  iopamidol (ISOVUE-300) 61 % injection (has no administration in time range)  sodium chloride 0.9 % 1,000 mL with thiamine 409 mg, folic acid 1 mg, multivitamins adult 10 mL infusion ( Intravenous Stopped 05/04/18 1738)  morphine 4 MG/ML injection 4 mg (4 mg Intravenous Given 05/04/18 1527)  iopamidol (ISOVUE-300) 61 % injection 100 mL (100 mLs Intravenous Contrast Given 05/04/18 1630)     Initial Impression / Assessment and Plan / ED Course  I have reviewed the triage vital signs and the nursing notes.  Pertinent labs & imaging results that were available during my care of the patient were reviewed by me and considered in my medical decision making (see chart for details).  Clinical Course as of May 05 1851  Fri May 04, 2018  1834 IMPRESSION: 1. No acute abnormality of the abdomen or pelvis. 2. Aortic atherosclerosis. 3. 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    CT ABDOMEN PELVIS W  CONTRAST [CG]  1835 Pancreas: Tiny calcification inferior aspect of the head of the pancreas, new since the prior study. This appears to be below the ampulla. Otherwise normal.  Spleen: Normal.  Adrenals/Urinary Tract: 17 mm peripelvic cysts in the inferior aspect of the right kidney. Kidneys and ureters and bladder are otherwise normal. Adrenal glands are normal. Slight chronic perinephric soft tissue stranding.    CT ABDOMEN PELVIS W CONTRAST [CG]    Clinical Course User Index [CG] Kinnie Feil, PA-C   Consider a kidney stone, appendicitis, right-sided diverticulitis, MSK etiology.  Patient had mechanical fall 1 month ago, vertebral fracture/compression fracture vs disc disease also considered.  No bladder/bowel retention or incontinence, saddle anesthesia or focal neuro deficits to suggest cauda equina.  AAA considered given sex, age and tobacco use.  He has intact distal pulses bilaterally.  Will obtain screening labs, urinalysis and CT A/P given positive McBurney's.  1836: Work up remarkable for new tiny calcification of head of pancreas, cyst in inferior right kidney and chronic perinephric soft tissue stranding, arthritic changes to hips with early stage AVN of left femoral head.  Re-evaluated pt and discussed work up. Favoring MSK etiology. UA without infection. Aorta with atherosclerosis but no other abnormalities.  Will dc with NSAID muscle relaxers and f/u with PCP in 48 hours if not improving. Discussed return precautions. Pt and wife in agreement.  Final Clinical Impressions(s) / ED Diagnoses   Final diagnoses:  Flank pain  Lumbar back pain    ED Discharge Orders        Ordered    naproxen (NAPROSYN) 500 MG tablet  2 times daily     05/04/18 1849    methocarbamol (ROBAXIN) 500 MG tablet  Every 8 hours PRN     05/04/18 1849       Arlean Hopping 05/04/18 Cameron Proud, MD 05/07/18 [phone]

## 2018-05-04 NOTE — Discharge Instructions (Addendum)
You were seen in the ER for right low back pain that moved to right low abdomen.   Labs, urine, CT were overall without new injuries or problems.  See CT results attached.  CT showed arthritis to your hips worse on the left, a cyst in the right kidney and a new tiny calcification at the head of the pancreas.  We will treat your symptoms like a muscular pain or spasm.  Take naproxen and robaxin as prescribed. Over the counter lidocaine patches can also help (salonpas).  Follow up with primary care doctor in 48 hours if symptoms not improving.

## 2018-05-04 NOTE — ED Notes (Signed)
Pt had drawn for labs:  Gold Blue Lavender Lt green dk green x2   Sent to lab at this time.

## 2018-05-05 LAB — PSA: PROSTATE SPECIFIC AG, SERUM: 0.7 ng/mL (ref 0.0–4.0)

## 2018-05-05 LAB — URINE CULTURE
Culture: NO GROWTH
ORGANISM ID, BACTERIA: NO GROWTH

## 2018-05-09 ENCOUNTER — Encounter: Payer: Self-pay | Admitting: Physician Assistant

## 2018-05-14 ENCOUNTER — Telehealth: Payer: Self-pay

## 2018-05-14 MED ORDER — DOXYCYCLINE HYCLATE 100 MG PO CAPS: 100.0000 mg | ORAL_CAPSULE | Freq: Two times a day (BID) | ORAL | 0 refills | Status: AC

## 2018-05-14 MED ORDER — NAPROXEN 500 MG PO TABS: 500.0000 mg | ORAL_TABLET | Freq: Two times a day (BID) | ORAL | 0 refills | Status: DC

## 2018-05-14 NOTE — Telephone Encounter (Signed)
Rx for Naproxen faxed to pharmacy on file.

## 2018-06-04 ENCOUNTER — Telehealth: Payer: Self-pay | Admitting: Physician Assistant

## 2018-06-04 ENCOUNTER — Other Ambulatory Visit: Payer: Self-pay | Admitting: *Deleted

## 2018-06-04 DIAGNOSIS — J441 Chronic obstructive pulmonary disease with (acute) exacerbation: Secondary | ICD-10-CM

## 2018-06-04 MED ORDER — ALBUTEROL SULFATE HFA 108 (90 BASE) MCG/ACT IN AERS: 2.0000 | INHALATION_SPRAY | Freq: Four times a day (QID) | RESPIRATORY_TRACT | 0 refills | Status: DC | PRN

## 2018-06-04 NOTE — Telephone Encounter (Signed)
Copied from [place] [id number]. Topic: Quick Communication - See Telephone Encounter >> Jun 04, 2018 10:38 AM Loma Boston wrote: CRM for notification. See Telephone encounter for: 06/04/18.

## 2018-06-04 NOTE — Telephone Encounter (Signed)
Done

## 2018-06-04 NOTE — Telephone Encounter (Signed)
  Requesting albuterol (PROVENTIL HFA;VENTOLIN HFA) 108 (90 Base) MCG/ACT inhaler   Pt states he "Lost his emergency back up inhaler."  States he was told to have a "Back up at all times."  If appropriate:  [employer]

## 2018-06-04 NOTE — Telephone Encounter (Signed)
Copied from [place] [id number]. Topic: Quick Communication - See Telephone Encounter >> Jun 04, 2018 10:52 AM Loma Boston wrote: CRM for notification. See Telephone encounter for: 06/04/18. Rxrefill albuterol (PROVENTIL HFA;VENTOLIN HFA) 108 (90 Base) MCG/ACT inhaler  @ [facility] [phone] Going out of town needs Emg back up per Philis Fendt at all times/ lost one

## 2018-07-13 ENCOUNTER — Ambulatory Visit: Payer: Self-pay | Admitting: *Deleted

## 2018-07-13 NOTE — Telephone Encounter (Signed)
Pt calling with complaints of increased swelling and redness to bilateral legs for the past 2 weeks. Pt states that he does have pitting edema in feet and ankles and his socks leave an indention in his leg. Pt states he has pain in his ankles and is rating pain between 5-7. Pt states that he has redness in his calves and his skin is rough in this area as well. Pt states he has pins and needles with tingling to his legs and feet. Pt states he has a history of neuropathy and alcoholism. Pt advised to seek care in the ED for current symptoms. Pt states he can not afford to go to the emergency room. Contacted FC, Kaiyden Simkin in the office to see if there was appt availability but nothing available for today or tomorrow. Pt advised again to seek care in the ED for current symptoms. Pt verbalized understanding.  Reason for Disposition . [1] Red area or streak [2] large (> 2 in. or 5 cm)  Answer Assessment - Initial Assessment Questions 1. ONSET: "When did the swelling start?" (e.g., minutes, hours, days)     2 weeks 2. LOCATION: "What part of the leg is swollen?"  "Are both legs swollen or just one leg?"     Both legs by the sock line 3. SEVERITY: "How bad is the swelling?" (e.g., localized; mild, moderate, severe)  - Localized - small area of swelling localized to one leg  - MILD pedal edema - swelling limited to foot and ankle, pitting edema < 1/4 inch (6 mm) deep, rest and elevation eliminate most or all swelling  - MODERATE edema - swelling of lower leg to knee, pitting edema > 1/4 inch (6 mm) deep, rest and elevation only partially reduce swelling  - SEVERE edema - swelling extends above knee, facial or hand swelling present      moderate 4. REDNESS: "Does the swelling look red or infected?"     Yes 5. PAIN: "Is the swelling painful to touch?" If so, ask: "How painful is it?"   (Scale 1-10; mild, moderate or severe)     5-6 maybe a  6. FEVER: "Do you have a fever?" If so, ask: "What is it, how was it  measured, and when did it start?"      Has not had temperature  7. CAUSE: "What do you think is causing the leg swelling?"     Neuropathy and alcoholic 8. MEDICAL HISTORY: "Do you have a history of heart failure, kidney disease, liver failure, or cancer?"     Hx of COPD and liver disease 9. RECURRENT SYMPTOM: "Have you had leg swelling before?" If so, ask: "When was the last time?" "What happened that time?"     Yes has occurred before 10. OTHER SYMPTOMS: "Do you have any other symptoms?" (e.g., chest pain, difficulty breathing)       Hx of COPD 11. PREGNANCY: "Is there any chance you are pregnant?" "When was your last menstrual period?"       n/a  Protocols used: LEG SWELLING AND EDEMA-A-AH

## 2018-07-15 ENCOUNTER — Emergency Department (HOSPITAL_COMMUNITY)
Admission: EM | Admit: 2018-07-15 | Discharge: 2018-07-15 | Disposition: A | Discharge status: Home / Self Care | Payer: Self-pay | Attending: Emergency Medicine | Admitting: Emergency Medicine

## 2018-07-15 ENCOUNTER — Emergency Department (HOSPITAL_COMMUNITY): Payer: Self-pay

## 2018-07-15 ENCOUNTER — Other Ambulatory Visit: Payer: Self-pay

## 2018-07-15 ENCOUNTER — Emergency Department (HOSPITAL_BASED_OUTPATIENT_CLINIC_OR_DEPARTMENT_OTHER): Payer: Self-pay

## 2018-07-15 ENCOUNTER — Encounter (HOSPITAL_COMMUNITY): Payer: Self-pay

## 2018-07-15 DIAGNOSIS — M79661 Pain in right lower leg: Secondary | ICD-10-CM | POA: Insufficient documentation

## 2018-07-15 DIAGNOSIS — M79609 Pain in unspecified limb: Secondary | ICD-10-CM

## 2018-07-15 DIAGNOSIS — R5383 Other fatigue: Secondary | ICD-10-CM | POA: Insufficient documentation

## 2018-07-15 DIAGNOSIS — F17210 Nicotine dependence, cigarettes, uncomplicated: Secondary | ICD-10-CM | POA: Insufficient documentation

## 2018-07-15 DIAGNOSIS — R600 Localized edema: Secondary | ICD-10-CM | POA: Insufficient documentation

## 2018-07-15 DIAGNOSIS — M79662 Pain in left lower leg: Secondary | ICD-10-CM | POA: Insufficient documentation

## 2018-07-15 DIAGNOSIS — R609 Edema, unspecified: Secondary | ICD-10-CM

## 2018-07-15 DIAGNOSIS — M7989 Other specified soft tissue disorders: Secondary | ICD-10-CM

## 2018-07-15 DIAGNOSIS — R0602 Shortness of breath: Secondary | ICD-10-CM | POA: Insufficient documentation

## 2018-07-15 DIAGNOSIS — R3912 Poor urinary stream: Secondary | ICD-10-CM | POA: Insufficient documentation

## 2018-07-15 DIAGNOSIS — Z79899 Other long term (current) drug therapy: Secondary | ICD-10-CM | POA: Insufficient documentation

## 2018-07-15 DIAGNOSIS — J449 Chronic obstructive pulmonary disease, unspecified: Secondary | ICD-10-CM | POA: Insufficient documentation

## 2018-07-15 HISTORY — DX: Alcohol abuse, uncomplicated: F10.10

## 2018-07-15 LAB — COMPREHENSIVE METABOLIC PANEL
ALK PHOS: 64 U/L (ref 38–126)
ALT: 27 U/L (ref 0–44)
AST: 39 U/L (ref 15–41)
Albumin: 4 g/dL (ref 3.5–5.0)
Anion gap: 11 (ref 5–15)
BILIRUBIN TOTAL: 1.7 mg/dL — AB (ref 0.3–1.2)
BUN: 8 mg/dL (ref 8–23)
CALCIUM: 9.5 mg/dL (ref 8.9–10.3)
CO2: 30 mmol/L (ref 22–32)
Chloride: 101 mmol/L (ref 98–111)
Creatinine, Ser: 0.74 mg/dL (ref 0.61–1.24)
GFR calc Af Amer: >60 mL/min (ref >60)
GFR calc non Af Amer: >60 mL/min (ref >60)
GLUCOSE: 102 mg/dL — AB (ref 70–99)
Potassium: 4.2 mmol/L (ref 3.5–5.1)
Sodium: 142 mmol/L (ref 135–145)
TOTAL PROTEIN: 7.2 g/dL (ref 6.5–8.1)

## 2018-07-15 LAB — URINALYSIS, ROUTINE W REFLEX MICROSCOPIC
BILIRUBIN URINE: NEGATIVE
GLUCOSE, UA: NEGATIVE mg/dL
Hgb urine dipstick: NEGATIVE
KETONES UR: 20 mg/dL — AB
LEUKOCYTES UA: NEGATIVE
Nitrite: NEGATIVE
PH: 6 (ref 5.0–8.0)
Protein, ur: NEGATIVE mg/dL
SPECIFIC GRAVITY, URINE: 1.019 (ref 1.005–1.030)

## 2018-07-15 LAB — I-STAT TROPONIN, ED: Troponin i, poc: 0 ng/mL (ref 0.00–0.08)

## 2018-07-15 LAB — CBC WITH DIFFERENTIAL/PLATELET
BASOS ABS: 0 10*3/uL (ref 0.0–0.1)
Basophils Relative: 0 %
EOS PCT: 1 %
Eosinophils Absolute: 0.1 10*3/uL (ref 0.0–0.7)
HEMATOCRIT: 57.4 % — AB (ref 39.0–52.0)
HEMOGLOBIN: 20.1 g/dL — AB (ref 13.0–17.0)
LYMPHS PCT: 22 %
Lymphs Abs: 1.2 10*3/uL (ref 0.7–4.0)
MCH: 35.9 pg — AB (ref 26.0–34.0)
MCHC: 35 g/dL (ref 30.0–36.0)
MCV: 102.5 fL — AB (ref 78.0–100.0)
Monocytes Absolute: 0.4 10*3/uL (ref 0.1–1.0)
Monocytes Relative: 8 %
NEUTROS ABS: 3.9 10*3/uL (ref 1.7–7.7)
Neutrophils Relative %: 69 %
Platelets: 143 10*3/uL — ABNORMAL LOW (ref 150–400)
RBC: 5.6 MIL/uL (ref 4.22–5.81)
RDW: 12.3 % (ref 11.5–15.5)
WBC: 5.7 10*3/uL (ref 4.0–10.5)

## 2018-07-15 LAB — BRAIN NATRIURETIC PEPTIDE: B NATRIURETIC PEPTIDE 5: 42.7 pg/mL (ref 0.0–100.0)

## 2018-07-15 LAB — MAGNESIUM: Magnesium: 1.9 mg/dL (ref 1.7–2.4)

## 2018-07-15 LAB — PROTIME-INR
INR: 1.12
Prothrombin Time: 14.3 seconds (ref 11.4–15.2)

## 2018-07-15 LAB — AMMONIA: Ammonia: 23 umol/L (ref 9–35)

## 2018-07-15 NOTE — ED Notes (Signed)
Patient transported to X-ray 

## 2018-07-15 NOTE — Progress Notes (Signed)
*  Preliminary Results* Bilateral lower extremity venous duplex completed. Bilateral lower extremities are negative for deep vein thrombosis. There is no evidence of Baker's cyst bilaterally.  07/15/2018 4:55 PM Maudry Mayhew, MHA, RVT, RDCS, RDMS

## 2018-07-15 NOTE — ED Notes (Signed)
For pulse oximetry ambulation, pt maintained O2 SATS above 90%, averaging 92% on RA, he denies exertion, labored breathing or shortness of breath.

## 2018-07-15 NOTE — ED Notes (Signed)
Pt reports that he has LE swelling, and numbness that has worsening x 2 weeks with a burning sensation.

## 2018-07-15 NOTE — ED Notes (Signed)
Urine sample clicked off by mistake. Urinal at bedside

## 2018-07-15 NOTE — Discharge Instructions (Signed)
Your work-up today showed your fluid levels were not significantly elevated and your chest x-ray did not show edema.  As you are able to ambulate safely without becoming hypoxic, we feel you are safe for discharge home however we think you need to follow-up with a primary doctor in the next several days.  If any symptoms change or worsen, please return immediately to the nearest emergency department.

## 2018-07-15 NOTE — ED Provider Notes (Signed)
[facility] DEPT Provider Note   CSN: [id number] Arrival date & time: 07/15/18  1348     History   Chief Complaint Chief Complaint  Patient presents with  . Leg Swelling  . numbness bilateral calves    HPI Elijah Barton is a 62 y.o. male.  The history is provided by the patient, the spouse and medical records. No language interpreter was used.  Leg Pain   This is a new problem. The current episode started more than 1 week ago. The problem occurs constantly. The problem has been gradually worsening. The pain is present in the left lower leg and right lower leg. The quality of the pain is described as aching. The pain is moderate. Pertinent negatives include no numbness, full range of motion, no stiffness and no tingling (at baseline). He has tried nothing for the symptoms. The treatment provided no relief. There has been no history of extremity trauma.    Past Medical History:  Diagnosis Date  . Alcohol abuse   . Back pain   . COPD (chronic obstructive pulmonary disease) ([place])   . Fatty liver   . Joint pain    all over except for hip  . Peripheral neuropathy     Patient Active Problem List   Diagnosis Date Noted  . Hypoxia 09/19/2017  . 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 Alcohol abuse, daily use 10/12/2014  . COPD exacerbation ([place]) 09/02/2013  . BMI 37.0-37.9, adult 09/02/2013  . Nicotine addiction 09/02/2013  . Peripheral neuropathy 03/12/2013    Past Surgical History:  Procedure Laterality Date  . LYMPH NODE BIOPSY     neck  . TONSILLECTOMY AND ADENOIDECTOMY          Home Medications    Prior to Admission medications   Medication Sig Start Date End Date Taking? Authorizing Provider  albuterol (PROVENTIL HFA;VENTOLIN HFA) 108 (90 Base) MCG/ACT inhaler Inhale 2 puffs into the lungs every 6 (six) hours as needed for wheezing or shortness of breath. 06/04/18  Yes Tereasa Coop, PA-C  b complex vitamins tablet Take 1 tablet by mouth 3  (three) times a week.    Yes [provider]  Coenzyme Q10 (CO Q 10 PO) Take 1 tablet by mouth daily.   Yes [provider]  cyclobenzaprine (FLEXERIL) 10 MG tablet Take 1 tablet (10 mg total) by mouth 3 (three) times daily as needed for muscle spasms. 06/28/17  Yes Jeffery, Chelle, PA  gabapentin (NEURONTIN) 100 MG capsule Take 1 capsule (100 mg total) by mouth 3 (three) times daily. 06/28/17  Yes Jeffery, Chelle, PA  TURMERIC PO Take 1 tablet by mouth 2 (two) times daily.   Yes [provider]  YUCCA PO Take 2 capsules by mouth 2 (two) times daily.   Yes [provider]  naproxen (NAPROSYN) 500 MG tablet Take 1 tablet (500 mg total) by mouth 2 (two) times daily. Patient not taking: Reported on 07/15/2018 05/14/18   Kalep, Full, PA-C  NON FORMULARY OverNight Oximetry 07/14/17   Harrison Mons, PA  NON FORMULARY Home oxygen concentrator 10/14/17   Harrison Mons, PA    Family History Family History  Problem Relation Age of Onset  . Colon cancer Mother   . Heart disease Mother   . Stroke Mother   . Cancer Mother        lung  . Cirrhosis Father   . Alcohol abuse Father     Social History Social History   Tobacco Use  .  Smoking status: Current Every Day Smoker    Packs/day: 2.00    Years: 40.00    Pack years: 80.00    Types: Cigarettes  . Smokeless tobacco: Never Used  Substance Use Topics  . Alcohol use: Yes    Alcohol/week: 15.0 standard drinks    Types: 15 Standard drinks or equivalent per week    Comment: 0-20 drinks/week.  . Drug use: No     Allergies   Penicillins   Review of Systems Review of Systems  Constitutional: Positive for fatigue. Negative for chills, diaphoresis and fever.  Eyes: Negative for visual disturbance.  Respiratory: Positive for shortness of breath. Negative for cough, chest tightness, wheezing and stridor.   Cardiovascular: Positive for leg swelling. Negative for chest pain and palpitations.    Gastrointestinal: Negative for abdominal pain, constipation, diarrhea, nausea and vomiting.  Genitourinary: Positive for decreased urine volume. Negative for frequency.  Musculoskeletal: Negative for back pain, neck pain, neck stiffness and stiffness.  Skin: Positive for color change. Negative for rash and wound.  Neurological: Negative for dizziness, tingling (at baseline), light-headedness, numbness and headaches.  Psychiatric/Behavioral: Negative for agitation.  All other systems reviewed and are negative.    Physical Exam Updated Vital Signs BP (!) 156/99   Pulse 73   Temp 98.3 F (36.8 C) (Oral)   Resp 18   Ht 6\' 2"  (1.88 m)   Wt 127 kg   SpO2 93%   BMI 35.95 kg/m   Physical Exam  Constitutional: He appears well-developed and well-nourished. No distress.  HENT:  Head: Normocephalic and atraumatic.  Mouth/Throat: Oropharynx is clear and moist. No oropharyngeal exudate.  Eyes: Pupils are equal, round, and reactive to light. Conjunctivae and EOM are normal.  Neck: Normal range of motion. Neck supple.  Cardiovascular: Normal rate, regular rhythm and intact distal pulses.  No murmur heard. Pulmonary/Chest: Effort normal. No stridor. No respiratory distress. He has no wheezes. He has rales. He exhibits no tenderness.  Abdominal: Soft. There is no tenderness.  Musculoskeletal: He exhibits edema. He exhibits no tenderness.  Neurological: He is alert. No sensory deficit. He exhibits normal muscle tone.  Skin: Skin is warm and dry. Capillary refill takes less than 2 seconds. He is not diaphoretic. No erythema. No pallor.  Psychiatric: He has a normal mood and affect.  Nursing note and vitals reviewed.    ED Treatments / Results  Labs (all labs ordered are listed, but only abnormal results are displayed) Labs Reviewed  CBC WITH DIFFERENTIAL/PLATELET - Abnormal; Notable for the following components:      Result Value   Hemoglobin 20.1 (*)    HCT 57.4 (*)    MCV 102.5 (*)     MCH 35.9 (*)    Platelets 143 (*)    All other components within normal limits  COMPREHENSIVE METABOLIC PANEL - Abnormal; Notable for the following components:   Glucose, Bld 102 (*)    Total Bilirubin 1.7 (*)    All other components within normal limits  URINALYSIS, ROUTINE W REFLEX MICROSCOPIC - Abnormal; Notable for the following components:   Color, Urine AMBER (*)    Ketones, ur 20 (*)    All other components within normal limits  URINE CULTURE  MAGNESIUM  BRAIN NATRIURETIC PEPTIDE  AMMONIA  PROTIME-INR  I-STAT TROPONIN, ED    EKG EKG Interpretation  Date/Time:  Sunday July 15 2018 17:03:50 EDT Ventricular Rate:  71 PR Interval:    QRS Duration: 88 QT Interval:  402 QTC Calculation:  437 R Axis:   -91 Text Interpretation:  Sinus rhythm Atrial premature complex Borderline prolonged PR interval Left anterior fascicular block Probable right ventricular hypertrophy Nonspecific T abnrm, anterolateral leads No prior ECG for comparison.  No STEMI Confirmed by Antony Blackbird [phone]) on 07/15/2018 5:23:24 PM   Radiology Dg Chest 2 View  Result Date: 07/15/2018 CLINICAL DATA:  Fatigue and shortness of breath EXAM: CHEST - 2 VIEW COMPARISON:  09/16/2017 FINDINGS: The lungs are hyperinflated with diffuse interstitial prominence. No focal airspace consolidation or pulmonary edema. No pleural effusion or pneumothorax. Normal cardiomediastinal contours. IMPRESSION: COPD without acute airspace disease. Electronically Signed   By: Ulyses Jarred M.D.   On: 07/15/2018 17:28    Procedures Procedures (including critical care time)  Medications Ordered in ED Medications - No data to display   Initial Impression / Assessment and Plan / ED Course  I have reviewed the triage vital signs and the nursing notes.  Pertinent labs & imaging results that were available during my care of the patient were reviewed by me and considered in my medical decision making (see chart for  details).     Elijah Barton is a 62 y.o. male with past medical history significant for COPD, fatty liver disease, chronic peripheral neuropathy and alcohol abuse who presents with 2 weeks of worsening bilateral lower extremity edema, exertional shortness of breath and fatigue.  Patient reports that he and his significant other just completed a 3200 mile car trip in the [place] 2 weeks ago.  He reports that since his return, he has had worsened bilateral lower extremity edema.  He reports of having bilateral leg pains that are worse than his baseline neuropathy pains.  He reports that they have looked somewhat splotchy and mottled at times with discoloration.  He says he is never had that the swollen before.  He does report that he was drinking heavily while on history of drinking up to 1/5 of liquor a day.  He reports that the last 2 weeks he had cut back to drinking 1 pint of liquor a day.  He denies any chest pain but says he has been feeling very tired and short of breath at times.  He reports that he cannot walk more than 50 feet without getting winded which is new for him.  He denies any history of heart failure.  He denies any fevers, chills, or palpitations.  He denies any conservation or diarrhea or blood in his stools.  He does report his urine has been decreased.  He reports he was seen several weeks ago with bilateral flank pain and back pain which has resolved.  He denies any pain in his abdomen or back at this time.  On exam, patient has pitting edema in both his lower extremities.  Normal symmetric strength in the legs.  He reports his neuropathies are unchanged from the baseline.  Patient had some posterior splotchy redness but no evidence of infection on exam.  Abdomen was nontender.  Lungs had decreased breath sounds and crackles in the bases.  No significant wheezing was appreciated.  No murmur was heard.  Symmetric radial pulses.  No focal neurologic deficit seen.  Clinical I am  concerned patient either has bilateral DVTs or he is having fluid overload and heart failure.  Holiday heart considered given his alcohol abuse on his trip.  Patient will have laboratory testing as well as bilateral DVT studies.  With his lack of any chest pain, have low suspicion for a  pulmonary embolism.  Patient will have ambulatory pulse ox checked after work-up to determine disposition.  Patient oxygen saturations are between 90 and 92 at rest.  Patient's diagnostic work-up was reassuring.  No evidence of DVT.  Troponin negative.  BNP not elevated.  No evidence of UTI.  Liver function and ammonia and INR were normal.  Bilirubin slightly elevated.  Magnesium normal.  Chest x-ray shows no edema or pneumonia.  Next  Patient was ambulated with pulse ox and he maintain oxygen saturations in the 90s.  He was no longer having shortness of breath.  Although I still think patient may have some fluid overload, his work-up did not reflect severe CHF or needing to be admitted.  Patient will follow-up with his PCP and understood return precautions.  Patient family had no other questions or concerns and patient was discharged in good condition.   Final Clinical Impressions(s) / ED Diagnoses   Final diagnoses:  Peripheral edema  Shortness of breath    ED Discharge Orders    None      Clinical Impression: 1. Peripheral edema   2. Shortness of breath     Disposition: Discharge  Condition: Good  I have discussed the results, Dx and Tx plan with the pt(& family if present). He/she/they expressed understanding and agree(s) with the plan. Discharge instructions discussed at great length. Strict return precautions discussed and pt &/or family have verbalized understanding of the instructions. No further questions at time of discharge.    New Prescriptions   No medications on file    Follow Up: [facility] [address] [place]  [address] [phone] Schedule an appointment as soon as possible for a visit    [facility] DEPT [place] [id number] mc [place]       Dannie Woolen, Gwenyth Allegra, MD 07/15/18 2053

## 2018-07-15 NOTE — ED Triage Notes (Signed)
Patient c/o bilateral feet. Bilateral calf numbness and neuropathy pain is worse. Patient states he came back from vacation and states he has been drinking heavily and it has taken a toll on him. Patient states he had 375 ml Vodka last night.

## 2018-07-19 ENCOUNTER — Other Ambulatory Visit: Payer: Self-pay

## 2018-07-19 ENCOUNTER — Encounter: Payer: Self-pay | Admitting: Emergency Medicine

## 2018-07-19 ENCOUNTER — Ambulatory Visit: Payer: Self-pay | Admitting: Emergency Medicine

## 2018-07-19 VITALS — BP 166/98 | HR 81 | Temp 97.9°F | Resp 20 | Ht 72.84 in | Wt 288.6 lb

## 2018-07-19 DIAGNOSIS — R609 Edema, unspecified: Secondary | ICD-10-CM

## 2018-07-19 DIAGNOSIS — Z7289 Other problems related to lifestyle: Secondary | ICD-10-CM

## 2018-07-19 DIAGNOSIS — F1090 Alcohol use, unspecified, uncomplicated: Secondary | ICD-10-CM

## 2018-07-19 DIAGNOSIS — J449 Chronic obstructive pulmonary disease, unspecified: Secondary | ICD-10-CM

## 2018-07-19 DIAGNOSIS — I739 Peripheral vascular disease, unspecified: Secondary | ICD-10-CM

## 2018-07-19 DIAGNOSIS — R030 Elevated blood-pressure reading, without diagnosis of hypertension: Secondary | ICD-10-CM

## 2018-07-19 DIAGNOSIS — F17200 Nicotine dependence, unspecified, uncomplicated: Secondary | ICD-10-CM

## 2018-07-19 DIAGNOSIS — Z23 Encounter for immunization: Secondary | ICD-10-CM

## 2018-07-19 MED ORDER — HYDROCHLOROTHIAZIDE 25 MG PO TABS: 25.0000 mg | ORAL_TABLET | Freq: Every day | ORAL | 3 refills | Status: DC

## 2018-07-19 MED ORDER — AZITHROMYCIN 250 MG PO TABS: ORAL_TABLET | ORAL | 0 refills | Status: DC

## 2018-07-19 NOTE — Progress Notes (Signed)
Elijah Barton 62 y.o.   Chief Complaint  Patient presents with  . Leg Swelling    X 2-3 mths both legs and feet- went to ER on Sunday   BP Readings from Last 3 Encounters:  07/19/18 (!) 166/98  07/15/18 (!) 141/82  05/04/18 131/84    HISTORY OF PRESENT ILLNESS: This is a 62 y.o. male seen in the ED on 9/29 2019 with peripheral edema.  Here for follow-up.  Work-up was negative.  No DVTs.  No signs of congestive heart failure.  Unremarkable blood work.  Patient was stable and discharged home to follow-up with PCP.  First visit with me.  Patient is a chronic smoker and chronic EtOH abuse.  Today has no complaints.  Stable.  Extensive medical history.  I was able to review some of the old medical records.  States he has a chronic cough and has noticed some green phlegm the past several days.  Patient has a history of COPD.  HPI   Prior to Admission medications   Medication Sig Start Date End Date Taking? Authorizing Provider  albuterol (PROVENTIL HFA;VENTOLIN HFA) 108 (90 Base) MCG/ACT inhaler Inhale 2 puffs into the lungs every 6 (six) hours as needed for wheezing or shortness of breath. 06/04/18  Yes Tereasa Coop, PA-C  b complex vitamins tablet Take 1 tablet by mouth 3 (three) times a week.    Yes [provider]  Coenzyme Q10 (CO Q 10 PO) Take 1 tablet by mouth daily.   Yes [provider]  cyclobenzaprine (FLEXERIL) 10 MG tablet Take 1 tablet (10 mg total) by mouth 3 (three) times daily as needed for muscle spasms. 06/28/17  Yes Jeffery, Chelle, PA  gabapentin (NEURONTIN) 100 MG capsule Take 1 capsule (100 mg total) by mouth 3 (three) times daily. 06/28/17  Yes Harrison Mons, PA  NON FORMULARY OverNight Oximetry 07/14/17  Yes Harrison Mons, PA  NON FORMULARY Home oxygen concentrator 10/14/17  Yes Jeffery, [place]le, PA  TURMERIC PO Take 1 tablet by mouth 2 (two) times daily.   Yes [provider]  YUCCA PO Take 2 capsules by mouth 2 (two) times daily.    Yes [provider]  naproxen (NAPROSYN) 500 MG tablet Take 1 tablet (500 mg total) by mouth 2 (two) times daily. Patient not taking: Reported on 07/15/2018 05/14/18   Tereasa Coop, PA-C    Allergies  Allergen Reactions  . Penicillins Other (See Comments)    Childhood Allergy Has patient had a PCN reaction causing immediate rash, facial/tongue/throat swelling, SOB or lightheadedness with hypotension: Unknown Has patient had a PCN reaction causing severe rash involving mucus membranes or skin necrosis: Unknown Has patient had a PCN reaction that required hospitalization: Unknown Has patient had a PCN reaction occurring within the last 10 years: Unknown If all of the above answers are "NO", then may proceed with Cephalosporin use.     Patient Active Problem List   Diagnosis Date Noted  . Hypoxia 09/19/2017  . 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 Alcohol abuse, daily use 10/12/2014  . COPD exacerbation ([place]) 09/02/2013  . BMI 37.0-37.9, adult 09/02/2013  . Nicotine addiction 09/02/2013  . Peripheral neuropathy 03/12/2013    Past Medical History:  Diagnosis Date  . Alcohol abuse   . Back pain   . COPD (chronic obstructive pulmonary disease) ([place])   . Fatty liver   . Joint pain    all over except for hip  . Peripheral neuropathy     Past Surgical  History:  Procedure Laterality Date  . LYMPH NODE BIOPSY     neck  . TONSILLECTOMY AND ADENOIDECTOMY      Social History   Socioeconomic History  . Marital status: Divorced    Spouse name: n/a  . Number of children: 0  . Years of education: Not on file  . Highest education level: Not on file  Occupational History  . Occupation: Visual merchandiser: [employer]  . Financial resource strain: Not on file  . Food insecurity:    Worry: Not on file    Inability: Not on file  . Transportation needs:    Medical: Not on file    Non-medical: Not on file  Tobacco Use  . Smoking status:  Current Every Day Smoker    Packs/day: 2.00    Years: 40.00    Pack years: 80.00    Types: Cigarettes  . Smokeless tobacco: Never Used  Substance and Sexual Activity  . Alcohol use: Yes    Alcohol/week: 15.0 standard drinks    Types: 15 Standard drinks or equivalent per week    Comment: 0-20 drinks/week.  . Drug use: No  . Sexual activity: Yes    Birth control/protection: Condom  Lifestyle  . Physical activity:    Days per week: Not on file    Minutes per session: Not on file  . Stress: Not on file  Relationships  . Social connections:    Talks on phone: Not on file    Gets together: Not on file    Attends religious service: Not on file    Active member of club or organization: Not on file    Attends meetings of clubs or organizations: Not on file    Relationship status: Not on file  . Intimate partner violence:    Fear of current or ex partner: Not on file    Emotionally abused: Not on file    Physically abused: Not on file    Forced sexual activity: Not on file  Other Topics Concern  . Not on file  Social History Narrative   Lives with his girlfriend with his 2 cats. Mother died in Apr 11, 2013 in [place], [place] in her home with Hospice.  Daughter lives in [place], [place].    Family History  Problem Relation Age of Onset  . Colon cancer Mother   . Heart disease Mother   . Stroke Mother   . Cancer Mother        lung  . Cirrhosis Father   . Alcohol abuse Father      Review of Systems  Constitutional: Negative.  Negative for chills and fever.  HENT: Negative.  Negative for sore throat.   Eyes: Negative for blurred vision and double vision.  Respiratory: Positive for cough (chronic) and sputum production.   Cardiovascular: Positive for leg swelling (chronic). Negative for chest pain and palpitations.  Gastrointestinal: Negative.  Negative for abdominal pain, nausea and vomiting.  Skin: Negative.   Neurological: Negative.  Negative for dizziness and headaches.     Vitals:   07/19/18 1628  BP: (!) 166/98  Pulse: 81  Resp: 20  Temp: 97.9 F (36.6 C)  SpO2: 95%    Physical Exam  Constitutional: He is oriented to person, place, and time. He appears well-developed and well-nourished.  HENT:  Head: Normocephalic and atraumatic.  Nose: Nose normal.  Mouth/Throat: Oropharynx is clear and moist.  Eyes: Pupils are equal, round, and reactive  to light. Conjunctivae and EOM are normal.  Neck: Normal range of motion. Neck supple.  Cardiovascular: Normal rate and regular rhythm.  Pulmonary/Chest: Effort normal and breath sounds normal.  Abdominal: Soft. There is no tenderness.  Musculoskeletal: He exhibits edema.  Neurological: He is alert and oriented to person, place, and time.  Skin: Skin is warm. Capillary refill takes less than 2 seconds.  Psychiatric: He has a normal mood and affect. His behavior is normal.  Vitals reviewed.      ASSESSMENT & PLAN: Saxton was seen today for leg swelling.  Diagnoses and all orders for this visit:  Peripheral edema -     hydrochlorothiazide (HYDRODIURIL) 25 MG tablet; Take 1 tablet (25 mg total) by mouth daily.  PVD (peripheral vascular disease) ([facility])  Chronic obstructive pulmonary disease, unspecified COPD type ([place])  Smoking  Chronic alcohol use  Elevated blood pressure reading  Flu vaccine need -     Cancel: Flu Vaccine QUAD 36+ mos IM  Other orders -     azithromycin (ZITHROMAX) 250 MG tablet; Sig as indicated    Patient Instructions       If you have lab work done today you will be contacted with your lab results within the next 2 weeks.  If you have not heard from [place] then please contact us. The fastest way to get your results is to register for My Chart.   IF you received an x-ray today, you will receive an invoice from [facility]. Please contact [facility] at [phone] with questions or concerns regarding your invoice.   IF you received labwork  today, you will receive an invoice from [facility]. Please contact [facility] at [phone] with questions or concerns regarding your invoice.   Our billing staff will not be able to assist you with questions regarding bills from these companies.  You will be contacted with the lab results as soon as they are available. The fastest way to get your results is to activate your My Chart account. Instructions are located on the last page of this paperwork. If you have not heard from [place] regarding the results in 2 weeks, please contact this office.     Edema Edema is when you have too much fluid in your body or under your skin. Edema may make your legs, feet, and ankles swell up. Swelling is also common in looser tissues, like around your eyes. This is a common condition. It gets more common as you get older. There are many possible causes of edema. Eating too much salt (sodium) and being on your feet or sitting for a long time can cause edema in your legs, feet, and ankles. Hot weather may make edema worse. Edema is usually painless. Your skin may look swollen or shiny. Follow these instructions at home:  Keep the swollen body part raised (elevated) above the level of your heart when you are sitting or lying down.  Do not sit still or stand for a long time.  Do not wear tight clothes. Do not wear garters on your upper legs.  Exercise your legs. This can help the swelling go down.  Wear elastic bandages or support stockings as told by your doctor.  Eat a low-salt (low-sodium) diet to reduce fluid as told by your doctor.  Depending on the cause of your swelling, you may need to limit how much fluid you drink (fluid restriction).  Take over-the-counter and prescription medicines only as told by your doctor. Contact a doctor if:  Treatment is not working.  You have heart, liver, or kidney disease and have symptoms of edema.  You have sudden and unexplained weight gain. Get help right away  if:  You have shortness of breath or chest pain.  You cannot breathe when you lie down.  You have pain, redness, or warmth in the swollen areas.  You have heart, liver, or kidney disease and get edema all of a sudden.  You have a fever and your symptoms get worse all of a sudden. Summary  Edema is when you have too much fluid in your body or under your skin.  Edema may make your legs, feet, and ankles swell up. Swelling is also common in looser tissues, like around your eyes.  Raise (elevate) the swollen body part above the level of your heart when you are sitting or lying down.  Follow your doctor's instructions about diet and how much fluid you can drink (fluid restriction). This information is not intended to replace advice given to you by your health care provider. Make sure you discuss any questions you have with your health care provider. Document Released: 03/21/2008 Document Revised: 10/21/2016 Document Reviewed: 10/21/2016 Elsevier Interactive Patient Education  2017 [employer].      Agustina Caroli, MD [facility]

## 2018-07-19 NOTE — Patient Instructions (Addendum)
If you have lab work done today you will be contacted with your lab results within the next 2 weeks.  If you have not heard from [place] then please contact us. The fastest way to get your results is to register for My Chart.   IF you received an x-ray today, you will receive an invoice from [facility]. Please contact [facility] at [phone] with questions or concerns regarding your invoice.   IF you received labwork today, you will receive an invoice from [facility]. Please contact [facility] at [phone] with questions or concerns regarding your invoice.   Our billing staff will not be able to assist you with questions regarding bills from these companies.  You will be contacted with the lab results as soon as they are available. The fastest way to get your results is to activate your My Chart account. Instructions are located on the last page of this paperwork. If you have not heard from [place] regarding the results in 2 weeks, please contact this office.     Edema Edema is when you have too much fluid in your body or under your skin. Edema may make your legs, feet, and ankles swell up. Swelling is also common in looser tissues, like around your eyes. This is a common condition. It gets more common as you get older. There are many possible causes of edema. Eating too much salt (sodium) and being on your feet or sitting for a long time can cause edema in your legs, feet, and ankles. Hot weather may make edema worse. Edema is usually painless. Your skin may look swollen or shiny. Follow these instructions at home:  Keep the swollen body part raised (elevated) above the level of your heart when you are sitting or lying down.  Do not sit still or stand for a long time.  Do not wear tight clothes. Do not wear garters on your upper legs.  Exercise your legs. This can help the swelling go down.  Wear elastic bandages or support stockings as told by your doctor.  Eat a  low-salt (low-sodium) diet to reduce fluid as told by your doctor.  Depending on the cause of your swelling, you may need to limit how much fluid you drink (fluid restriction).  Take over-the-counter and prescription medicines only as told by your doctor. Contact a doctor if:  Treatment is not working.  You have heart, liver, or kidney disease and have symptoms of edema.  You have sudden and unexplained weight gain. Get help right away if:  You have shortness of breath or chest pain.  You cannot breathe when you lie down.  You have pain, redness, or warmth in the swollen areas.  You have heart, liver, or kidney disease and get edema all of a sudden.  You have a fever and your symptoms get worse all of a sudden. Summary  Edema is when you have too much fluid in your body or under your skin.  Edema may make your legs, feet, and ankles swell up. Swelling is also common in looser tissues, like around your eyes.  Raise (elevate) the swollen body part above the level of your heart when you are sitting or lying down.  Follow your doctor's instructions about diet and how much fluid you can drink (fluid restriction). This information is not intended to replace advice given to you by your health care provider. Make sure you discuss any questions you have with your health care provider. Document Released:  03/21/2008 Document Revised: 10/21/2016 Document Reviewed: 10/21/2016 Elsevier Interactive Patient Education  2017 [employer].

## 2018-10-03 ENCOUNTER — Other Ambulatory Visit: Payer: Self-pay | Admitting: General Practice

## 2018-10-03 DIAGNOSIS — M545 Low back pain, unspecified: Secondary | ICD-10-CM

## 2018-10-03 DIAGNOSIS — G8929 Other chronic pain: Secondary | ICD-10-CM

## 2018-10-03 DIAGNOSIS — G6289 Other specified polyneuropathies: Secondary | ICD-10-CM

## 2018-10-03 MED ORDER — GABAPENTIN 100 MG PO CAPS: 100.0000 mg | ORAL_CAPSULE | Freq: Three times a day (TID) | ORAL | 0 refills | Status: DC

## 2018-10-03 NOTE — Telephone Encounter (Signed)
Requested medication (s) are due for refill today: Yes  Requested medication (s) are on the active medication list: Yes  Last refill:  06/28/17  Future visit scheduled: No  Notes to clinic:  See request.    Requested Prescriptions  Pending Prescriptions Disp Refills   cyclobenzaprine (FLEXERIL) 10 MG tablet 90 tablet 11    Sig: Take 1 tablet (10 mg total) by mouth 3 (three) times daily as needed for muscle spasms.     Not Delegated - Analgesics:  Muscle Relaxants Failed - 10/03/2018  9:17 AM      Failed - This refill cannot be delegated      Passed - Valid encounter within last 6 months    Recent Outpatient Visits          2 months ago Peripheral edema   Primary Care at [facility], [place], [place]   5 months ago Abnormal urine color   Primary Care at [place], PA-C   6 months ago COPD exacerbation [facility])   Primary Care at [place], PA-C   1 year ago Constipation, unspecified constipation type   Primary Care at [facility], [place], [place]   1 year ago COPD exacerbation [facility])   Primary Care at [place], [place]           Signed Prescriptions Disp Refills   gabapentin (NEURONTIN) 100 MG capsule 90 capsule 0    Sig: Take 1 capsule (100 mg total) by mouth 3 (three) times daily.     Neurology: Anticonvulsants - gabapentin Passed - 10/03/2018  9:17 AM      Passed - Valid encounter within last 12 months    Recent Outpatient Visits          2 months ago Peripheral edema   Primary Care at [facility], [employer], [place]   5 months ago Abnormal urine color   Primary Care at [place], PA-C   6 months ago COPD exacerbation [facility])   Primary Care at [place], PA-C   1 year ago Constipation, unspecified constipation type   Primary Care at [facility], [place], [place]   1 year ago COPD exacerbation [facility])   Primary Care at [facility], [place], [place]

## 2018-10-03 NOTE — Telephone Encounter (Signed)
Copied from [place] [id number]. Topic: Quick Communication - Rx Refill/Question >> Oct 03, 2018  8:39 AM Alfredia Ferguson R wrote: Medication:  gabapentin (NEURONTIN) 100 MG capsule  , cyclobenzaprine (FLEXERIL) 10 MG tablet  Has the patient contacted their pharmacy?Yes Preferred Pharmacy (with phone number or street name): [place], [place] [phone] (Phone) [phone] (Fax)    Agent: Please be advised that RX refills may take up to 3 business days. We ask that you follow-up with your pharmacy.

## 2018-10-04 NOTE — Telephone Encounter (Signed)
Please advise 

## 2018-11-27 DIAGNOSIS — M62830 Muscle spasm of back: Secondary | ICD-10-CM | POA: Insufficient documentation

## 2020-06-03 ENCOUNTER — Encounter: Payer: Self-pay | Admitting: Physician Assistant

## 2020-06-03 ENCOUNTER — Other Ambulatory Visit: Payer: Self-pay

## 2020-06-03 ENCOUNTER — Telehealth (INDEPENDENT_AMBULATORY_CARE_PROVIDER_SITE_OTHER): Payer: Medicare Other | Admitting: Physician Assistant

## 2020-06-03 VITALS — BP 140/90 | Temp 98.6°F | Ht 74.0 in | Wt 280.0 lb

## 2020-06-03 DIAGNOSIS — F17219 Nicotine dependence, cigarettes, with unspecified nicotine-induced disorders: Secondary | ICD-10-CM | POA: Diagnosis not present

## 2020-06-03 DIAGNOSIS — G8929 Other chronic pain: Secondary | ICD-10-CM

## 2020-06-03 DIAGNOSIS — M545 Low back pain, unspecified: Secondary | ICD-10-CM

## 2020-06-03 DIAGNOSIS — R609 Edema, unspecified: Secondary | ICD-10-CM

## 2020-06-03 DIAGNOSIS — J441 Chronic obstructive pulmonary disease with (acute) exacerbation: Secondary | ICD-10-CM | POA: Diagnosis not present

## 2020-06-03 MED ORDER — CYCLOBENZAPRINE HCL 10 MG PO TABS: 10.0000 mg | ORAL_TABLET | Freq: Three times a day (TID) | ORAL | 11 refills | Status: DC | PRN

## 2020-06-03 MED ORDER — GABAPENTIN 300 MG PO CAPS: 300.0000 mg | ORAL_CAPSULE | Freq: Three times a day (TID) | ORAL | 1 refills | Status: DC

## 2020-06-03 MED ORDER — TIOTROPIUM BROMIDE MONOHYDRATE 18 MCG IN CAPS: 18.0000 ug | ORAL_CAPSULE | Freq: Every day | RESPIRATORY_TRACT | 3 refills | Status: DC

## 2020-06-03 MED ORDER — NAPROXEN 500 MG PO TABS: 500.0000 mg | ORAL_TABLET | Freq: Two times a day (BID) | ORAL | 0 refills | Status: DC

## 2020-06-03 MED ORDER — HYDROCHLOROTHIAZIDE 25 MG PO TABS: 25.0000 mg | ORAL_TABLET | Freq: Every day | ORAL | 3 refills | Status: DC

## 2020-06-03 MED ORDER — CHANTIX STARTING MONTH PAK 0.5 MG X 11 & 1 MG X 42 PO TABS: ORAL_TABLET | ORAL | 0 refills | Status: DC

## 2020-06-03 MED ORDER — AZITHROMYCIN 250 MG PO TABS: ORAL_TABLET | ORAL | 0 refills | Status: DC

## 2020-06-03 NOTE — Progress Notes (Signed)
Virtual Visit via Video   I connected with patient on 06/03/20 at  2:00 PM EDT by a video enabled telemedicine application and verified that I am speaking with the correct person using two identifiers.  Location patient: Home Location provider: [facility], Office Persons participating in the virtual visit: Patient, Provider, [place] (Patina Moore)  I discussed the limitations of evaluation and management by telemedicine and the availability of in person appointments. The patient expressed understanding and agreed to proceed.  Subjective:   HPI:   Patient presents via [place] today to establish care.   Patient with history of COPD, moderate with a significant smoking history. Patient currently smoking about 2-3 ppd for > 30 years.  Has an albuterol inhaler that he uses as needed. Over past couple of weeks has noted increase in baseline sputum production and chest congestion, now green in coloration with mild chest tightness. Denies any fever, chills, shortness of breath. Occasional wheezing. Denies recent travel or sick contact. Denies any sinus symptoms, loss of taste or smell, GI symptoms.   Patient also with a history of peripheral edema, on a regimen of HCTZ 25 mg daily by prior provider. Has noted great results with this. Denies history of true hypertension. Patient denies chest pain, palpitations, lightheadedness, dizziness, vision changes or frequent headaches. Is out of medication and needs a refill.  Patient also with chronic low back pain secondary to OA and disc herniation. Is currently prescribed a regimen of Gabapentin but has been out of medication. Notes increased in pain level. Wants to discuss options for pain. Has history of opioid abuse > 10 years ago.  ROS:   See pertinent positives and negatives per HPI.  Patient Active Problem List   Diagnosis Date Noted  . Hypoxia 09/19/2017  . 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 Alcohol abuse, daily use 10/12/2014  . COPD  exacerbation ([place]) 09/02/2013  . BMI 37.0-37.9, adult 09/02/2013  . Nicotine addiction 09/02/2013  . Peripheral neuropathy 03/12/2013    Social History   Tobacco Use  . Smoking status: Current Every Day Smoker    Packs/day: 2.00    Years: 40.00    Pack years: 80.00    Types: Cigarettes  . Smokeless tobacco: Never Used  Substance Use Topics  . Alcohol use: Yes    Alcohol/week: 15.0 standard drinks    Types: 15 Standard drinks or equivalent per week    Comment: 0-20 drinks/week. Pint of Liquor day     Current Outpatient Medications:  .  albuterol (PROVENTIL HFA;VENTOLIN HFA) 108 (90 Base) MCG/ACT inhaler, Inhale 2 puffs into the lungs every 6 (six) hours as needed for wheezing or shortness of breath., Disp: 1 Inhaler, Rfl: 0 .  b complex vitamins tablet, Take 1 tablet by mouth 3 (three) times a week. , Disp: , Rfl:  .  Coenzyme Q10 (CO Q 10 PO), Take 1 tablet by mouth daily., Disp: , Rfl:  .  cyclobenzaprine (FLEXERIL) 10 MG tablet, Take 1 tablet (10 mg total) by mouth 3 (three) times daily as needed for muscle spasms., Disp: 90 tablet, Rfl: 11 .  gabapentin (NEURONTIN) 100 MG capsule, Take 1 capsule (100 mg total) by mouth 3 (three) times daily., Disp: 90 capsule, Rfl: 0 .  hydrochlorothiazide (HYDRODIURIL) 25 MG tablet, Take 1 tablet (25 mg total) by mouth daily., Disp: 90 tablet, Rfl: 3 .  naproxen (NAPROSYN) 500 MG tablet, Take 1 tablet (500 mg total) by mouth 2 (two) times daily., Disp: 30 tablet, Rfl: 0 .  TURMERIC PO, Take 1 tablet by mouth 2 (two) times daily., Disp: , Rfl:  .  YUCCA PO, Take 2 capsules by mouth 2 (two) times daily., Disp: , Rfl:  .  NON FORMULARY, OverNight Oximetry (Patient not taking: Reported on 06/03/2020), Disp: 1 each, Rfl: prn .  NON FORMULARY, Home oxygen concentrator (Patient not taking: Reported on 06/03/2020), Disp: 1 Units, Rfl: 1  Allergies  Allergen Reactions  . Penicillins Other (See Comments)    Childhood Allergy Has patient had a PCN  reaction causing immediate rash, facial/tongue/throat swelling, SOB or lightheadedness with hypotension: Unknown Has patient had a PCN reaction causing severe rash involving mucus membranes or skin necrosis: Unknown Has patient had a PCN reaction that required hospitalization: Unknown Has patient had a PCN reaction occurring within the last 10 years: Unknown If all of the above answers are "NO", then may proceed with Cephalosporin use.     Objective:   BP 140/90 Comment: at last check  Temp 98.6 F (37 C) (Oral)   Ht 6\' 2"  (1.88 m)   Wt 280 lb (127 kg)   BMI 35.95 kg/m   Patient is well-developed, well-nourished in no acute distress.  Resting comfortably at home.  Head is normocephalic, atraumatic.  No labored breathing.  Speech is clear and coherent with logical content.  Patient is alert and oriented at baseline.   Assessment and Plan:   1. COPD exacerbation (HCC) Will add-on Spiriva. Start Mucinex-DM twice daily. Increase fluids. Rx Azithromycin for COPD exacerbation.  - azithromycin (ZITHROMAX) 250 MG tablet; Take 2 tablets on Day 1. Then take 1 tablet daily.  Dispense: 6 tablet; Refill: 0  2. Cigarette nicotine dependence with nicotine-induced disorder Long discussion about consequences of smoking. Would like to quit. Has tried in the past but has had issue. Has tried to quit cold [place] and use of nicoderm patch. Would like to start Chantix. Feel this is reasonable. Rx starter pack sent in. In-office follow-up scheduled. Will also discuss lung cancer screening at that visit.  - varenicline (CHANTIX STARTING MONTH PAK) 0.5 MG X 11 & 1 MG X 42 tablet; Take one 0.5 mg tablet by mouth once daily for 3 days, then increase to one 0.5 mg tablet twice daily for 4 days, then increase to one 1 mg tablet twice daily.  Dispense: 53 tablet; Refill: 0  3. Chronic low back pain without sciatica, unspecified back pain laterality Will refill Flexeril and Gabapentin to restart as directed.  Can add-on Naproxen up to twice daily as needed for now. No controlled medications will be given due to history. In-office follow-up scheduled. If not getting under control will set up with Pain specialist in the area.  - cyclobenzaprine (FLEXERIL) 10 MG tablet; Take 1 tablet (10 mg total) by mouth 3 (three) times daily as needed for muscle spasms.  Dispense: 90 tablet; Refill: 11 - gabapentin (NEURONTIN) 300 MG capsule; Take 1 capsule (300 mg total) by mouth 3 (three) times daily.  Dispense: 90 capsule; Refill: 1 - naproxen (NAPROSYN) 500 MG tablet; Take 1 tablet (500 mg total) by mouth 2 (two) times daily.  Dispense: 30 tablet; Refill: 0  4. Peripheral edema Stable per patient. Unable to visualize legs during video visit. HCTZ refilled. In-office follow-up scheduled.  - hydrochlorothiazide (HYDRODIURIL) 25 MG tablet; Take 1 tablet (25 mg total) by mouth daily.  Dispense: 90 tablet; Refill: 3    Leeanne Rio, [place] 06/03/2020

## 2020-06-04 ENCOUNTER — Telehealth: Payer: Self-pay | Admitting: General Practice

## 2020-06-04 ENCOUNTER — Other Ambulatory Visit: Payer: Self-pay | Admitting: *Deleted

## 2020-06-04 DIAGNOSIS — F17219 Nicotine dependence, cigarettes, with unspecified nicotine-induced disorders: Secondary | ICD-10-CM

## 2020-06-04 DIAGNOSIS — Z6837 Body mass index (BMI) 37.0-37.9, adult: Secondary | ICD-10-CM

## 2020-06-04 DIAGNOSIS — J441 Chronic obstructive pulmonary disease with (acute) exacerbation: Secondary | ICD-10-CM

## 2020-06-04 NOTE — Progress Notes (Signed)
°  Chronic Care Management   Outreach Note  06/04/2020 Name: Elijah Barton MRN: [id number] DOB: [birth year]  Referred by: Patient, No Pcp Per Reason for referral : No chief complaint on file.   An unsuccessful telephone outreach was attempted today. The patient was referred to the pharmacist for assistance with care management and care coordination.   Follow Up Plan:   Earney [place] Upstream Scheduler

## 2020-06-09 ENCOUNTER — Telehealth: Payer: Self-pay | Admitting: Physician Assistant

## 2020-06-09 NOTE — Telephone Encounter (Signed)
My recommendation would be for him to schedule a complete physical for after respiratory symptoms have calmed down (recent COPD exacerbation). This was we can review preventive health items we did not get to at time of initial visit, follow-up regarding care started and update all fasting labs.

## 2020-06-09 NOTE — Progress Notes (Signed)
  Chronic Care Management   Outreach Note  06/09/2020 Name: Elijah Barton MRN: [id number] DOB: [birth year]  Referred by: [name], PA-C Reason for referral : No chief complaint on file.   An unsuccessful telephone outreach was attempted today. The patient was referred to the pharmacist for assistance with care management and care coordination.   Follow Up Plan:   Earney Hamburg Upstream Scheduler

## 2020-06-09 NOTE — Telephone Encounter (Signed)
Patient called requesting "routine labs" or any labs pertaining to issues discussed at 8/18 visit.   I did not see when patient was to follow up on AVS.  Patient is requesting orders to be entered to have done at our [place] lab location.   Please advise.

## 2020-06-09 NOTE — Telephone Encounter (Signed)
Reviewed patient chart from his video visit on 06/03/20. I don't see where there was a discussion about blood work. Please advise

## 2020-06-10 ENCOUNTER — Encounter: Payer: Self-pay | Admitting: Physician Assistant

## 2020-06-10 NOTE — Telephone Encounter (Signed)
Spoke with patient and advised he could come in the office for CPE as long as his URI symptoms have improved. Scheduled for CPE on 06/19/20

## 2020-06-11 ENCOUNTER — Telehealth: Payer: Self-pay | Admitting: Physician Assistant

## 2020-06-11 NOTE — Progress Notes (Signed)
  Chronic Care Management   Outreach Note  06/11/2020 Name: Elijah Barton MRN: [id number] DOB: Mar 07, 1956  Referred by: [name], PA-C Reason for referral : No chief complaint on file.   An unsuccessful telephone outreach was attempted today. The patient was referred to the pharmacist for assistance with care management and care coordination.   Follow Up Plan:   Earney [place] Upstream Scheduler

## 2020-06-19 ENCOUNTER — Other Ambulatory Visit: Payer: Self-pay

## 2020-06-19 ENCOUNTER — Ambulatory Visit (INDEPENDENT_AMBULATORY_CARE_PROVIDER_SITE_OTHER): Payer: Medicare Other | Admitting: Physician Assistant

## 2020-06-19 ENCOUNTER — Encounter: Payer: Self-pay | Admitting: Physician Assistant

## 2020-06-19 ENCOUNTER — Other Ambulatory Visit (INDEPENDENT_AMBULATORY_CARE_PROVIDER_SITE_OTHER): Payer: Medicare Other

## 2020-06-19 VITALS — BP 144/82 | HR 68 | Temp 98.1°F | Resp 16 | Ht 72.0 in | Wt 313.0 lb

## 2020-06-19 DIAGNOSIS — Z0000 Encounter for general adult medical examination without abnormal findings: Secondary | ICD-10-CM

## 2020-06-19 DIAGNOSIS — R609 Edema, unspecified: Secondary | ICD-10-CM

## 2020-06-19 DIAGNOSIS — G6289 Other specified polyneuropathies: Secondary | ICD-10-CM | POA: Diagnosis not present

## 2020-06-19 DIAGNOSIS — M545 Low back pain: Secondary | ICD-10-CM | POA: Diagnosis not present

## 2020-06-19 DIAGNOSIS — F1010 Alcohol abuse, uncomplicated: Secondary | ICD-10-CM

## 2020-06-19 DIAGNOSIS — F17219 Nicotine dependence, cigarettes, with unspecified nicotine-induced disorders: Secondary | ICD-10-CM | POA: Diagnosis not present

## 2020-06-19 DIAGNOSIS — G8929 Other chronic pain: Secondary | ICD-10-CM

## 2020-06-19 DIAGNOSIS — R718 Other abnormality of red blood cells: Secondary | ICD-10-CM

## 2020-06-19 LAB — CBC WITH DIFFERENTIAL/PLATELET
Basophils Absolute: 0 10*3/uL (ref 0.0–0.1)
Basophils Relative: 0.8 % (ref 0.0–3.0)
Eosinophils Absolute: 0.1 10*3/uL (ref 0.0–0.7)
Eosinophils Relative: 1.2 % (ref 0.0–5.0)
HCT: 49.6 % (ref 39.0–52.0)
Hemoglobin: 16.9 g/dL (ref 13.0–17.0)
Lymphocytes Relative: 22 % (ref 12.0–46.0)
Lymphs Abs: 1.4 10*3/uL (ref 0.7–4.0)
MCHC: 34.1 g/dL (ref 30.0–36.0)
MCV: 105.2 fl — ABNORMAL HIGH (ref 78.0–100.0)
Monocytes Absolute: 0.4 10*3/uL (ref 0.1–1.0)
Monocytes Relative: 5.9 % (ref 3.0–12.0)
Neutro Abs: 4.3 10*3/uL (ref 1.4–7.7)
Neutrophils Relative %: 70.1 % (ref 43.0–77.0)
Platelets: 161 10*3/uL (ref 150.0–400.0)
RBC: 4.71 Mil/uL (ref 4.22–5.81)
RDW: 12.9 % (ref 11.5–15.5)
WBC: 6.2 10*3/uL (ref 4.0–10.5)

## 2020-06-19 LAB — LIPID PANEL
Cholesterol: 186 mg/dL (ref 0–200)
HDL: 49.7 mg/dL (ref >39.00)
LDL Cholesterol: 117 mg/dL — ABNORMAL HIGH (ref 0–99)
NonHDL: 136.01
Total CHOL/HDL Ratio: 4
Triglycerides: 93 mg/dL (ref 0.0–149.0)
VLDL: 18.6 mg/dL (ref 0.0–40.0)

## 2020-06-19 LAB — VITAMIN B12: Vitamin B-12: 179 pg/mL — ABNORMAL LOW (ref 211–911)

## 2020-06-19 LAB — COMPREHENSIVE METABOLIC PANEL
ALT: 23 U/L (ref 0–53)
AST: 34 U/L (ref 0–37)
Albumin: 4.1 g/dL (ref 3.5–5.2)
Alkaline Phosphatase: 59 U/L (ref 39–117)
BUN: 9 mg/dL (ref 6–23)
CO2: 28 mEq/L (ref 19–32)
Calcium: 9.3 mg/dL (ref 8.4–10.5)
Chloride: 103 mEq/L (ref 96–112)
Creatinine, Ser: 0.77 mg/dL (ref 0.40–1.50)
GFR: 101.36 mL/min (ref >60.00)
Glucose, Bld: 95 mg/dL (ref 70–99)
Potassium: 4 mEq/L (ref 3.5–5.1)
Sodium: 139 mEq/L (ref 135–145)
Total Bilirubin: 1.2 mg/dL (ref 0.2–1.2)
Total Protein: 6.6 g/dL (ref 6.0–8.3)

## 2020-06-19 LAB — SEDIMENTATION RATE: Sed Rate: 14 mm/hr (ref 0–20)

## 2020-06-19 LAB — FOLATE: Folate: 6.1 ng/mL (ref >5.9)

## 2020-06-19 LAB — HEMOGLOBIN A1C: Hgb A1c MFr Bld: 5.2 % (ref 4.6–6.5)

## 2020-06-19 MED ORDER — LOSARTAN POTASSIUM 50 MG PO TABS: 50.0000 mg | ORAL_TABLET | Freq: Every day | ORAL | 3 refills | Status: DC

## 2020-06-19 MED ORDER — FUROSEMIDE 20 MG PO TABS: 20.0000 mg | ORAL_TABLET | Freq: Every day | ORAL | 3 refills | Status: DC

## 2020-06-19 MED ORDER — CYCLOBENZAPRINE HCL 10 MG PO TABS: 10.0000 mg | ORAL_TABLET | Freq: Three times a day (TID) | ORAL | 1 refills | Status: DC | PRN

## 2020-06-19 NOTE — Progress Notes (Signed)
Patient presents to clinic today for annual exam.  Patient is fasting for labs.  Health Maintenance: Immunizations -- TDaP up-to-date. Declines flu and pneumonia vaccines AMA.  Colon Cancer Screening -- Due for repeat colonoscopy. Order placed.   Past Medical History:  Diagnosis Date  . Alcohol abuse   . Allergies   . Arthritis   . Back pain   . Cataracts, bilateral   . COPD (chronic obstructive pulmonary disease) ([place])   . Fatty liver   . Glaucoma   . History of depression   . Joint pain    all over except for hip  . Peripheral neuropathy   . Substance abuse [facility])     Past Surgical History:  Procedure Laterality Date  . LYMPH NODE BIOPSY  1970   neck  . TONSILLECTOMY AND ADENOIDECTOMY  1968    Current Outpatient Medications on File Prior to Visit  Medication Sig Dispense Refill  . b complex vitamins tablet Take 1 tablet by mouth 3 (three) times a week.     . Coenzyme Q10 (CO Q 10 PO) Take 1 tablet by mouth daily.    Marland Kitchen gabapentin (NEURONTIN) 300 MG capsule Take 1 capsule (300 mg total) by mouth 3 (three) times daily. 90 capsule 1  . hydrochlorothiazide (HYDRODIURIL) 25 MG tablet Take 1 tablet (25 mg total) by mouth daily. 90 tablet 3  . naproxen (NAPROSYN) 500 MG tablet Take 1 tablet (500 mg total) by mouth 2 (two) times daily. 30 tablet 0  . tiotropium (SPIRIVA) 18 MCG inhalation capsule Place 1 capsule (18 mcg total) into inhaler and inhale daily. 30 capsule 3  . TURMERIC PO Take 1 tablet by mouth 2 (two) times daily.    . YUCCA PO Take 2 capsules by mouth 2 (two) times daily.     No current facility-administered medications on file prior to visit.    Allergies  Allergen Reactions  . Penicillins Other (See Comments)    Childhood Allergy Has patient had a PCN reaction causing immediate rash, facial/tongue/throat swelling, SOB or lightheadedness with hypotension: Unknown Has patient had a PCN reaction causing severe rash involving mucus membranes or skin  necrosis: Unknown Has patient had a PCN reaction that required hospitalization: Unknown Has patient had a PCN reaction occurring within the last 10 years: Unknown If all of the above answers are "NO", then may proceed with Cephalosporin use.     Family History  Problem Relation Age of Onset  . Colon cancer Mother   . Heart disease Mother   . Stroke Mother   . Cancer Mother        lung  . Alcohol abuse Mother   . Arthritis Mother   . Asthma Mother   . COPD Mother   . Depression Mother   . Cirrhosis Father   . Alcohol abuse Father   . Alcohol abuse Brother     Social History   Socioeconomic History  . Marital status: Divorced    Spouse name: n/a  . Number of children: 0  . Years of education: Not on file  . Highest education level: Not on file  Occupational History  . Occupation: Visual merchandiser: [employer]  Tobacco Use  . Smoking status: Current Every Day Smoker    Packs/day: 2.00    Years: 40.00    Pack years: 80.00    Types: Cigarettes  . Smokeless tobacco: Never Used  Vaping Use  . Vaping Use: Former  Substance and Sexual Activity  . Alcohol use: Yes    Alcohol/week: 15.0 standard drinks    Types: 15 Standard drinks or equivalent per week    Comment: 0-20 drinks/week. Pint of Liquor day   . Drug use: Yes    Types: Marijuana  . Sexual activity: Yes    Birth control/protection: Condom  Other Topics Concern  . Not on file  Social History Narrative   Lives with his girlfriend with his 2 cats. Mother died in 03/13/13 in [place], [place] in her home with Hospice.  Daughter lives in [place], [place].   Social Determinants of Health   Financial Resource Strain:   . Difficulty of Paying Living Expenses: Not on file  Food Insecurity:   . Worried About Charity fundraiser in the Last Year: Not on file  . Ran Out of Food in the Last Year: Not on file  Transportation Needs:   . Lack of Transportation (Medical): Not on file  . Lack of  Transportation (Non-Medical): Not on file  Physical Activity:   . Days of Exercise per Week: Not on file  . Minutes of Exercise per Session: Not on file  Stress:   . Feeling of Stress : Not on file  Social Connections:   . Frequency of Communication with Friends and Family: Not on file  . Frequency of Social Gatherings with Friends and Family: Not on file  . Attends Religious Services: Not on file  . Active Member of Clubs or Organizations: Not on file  . Attends Archivist Meetings: Not on file  . Marital Status: Not on file  Intimate Partner Violence:   . Fear of Current or Ex-Partner: Not on file  . Emotionally Abused: Not on file  . Physically Abused: Not on file  . Sexually Abused: Not on file   Review of Systems  Constitutional: Negative for fever and weight loss.  HENT: Negative for ear discharge, ear pain, hearing loss and tinnitus.   Eyes: Negative for blurred vision, double vision, photophobia and pain.  Respiratory: Positive for cough (chronic 2/2 COPD. no acute changes). Negative for shortness of breath.   Cardiovascular: Positive for leg swelling. Negative for chest pain and palpitations.  Gastrointestinal: Negative for abdominal pain, blood in stool, constipation, diarrhea, heartburn, melena, nausea and vomiting.  Genitourinary: Negative for dysuria, flank pain, frequency, hematuria and urgency.  Musculoskeletal: Negative for falls.  Neurological: Negative for dizziness, loss of consciousness and headaches.  Endo/Heme/Allergies: Negative for environmental allergies.  Psychiatric/Behavioral: Negative for depression, hallucinations, substance abuse and suicidal ideas. The patient is not nervous/anxious and does not have insomnia.    Resp 16   Ht 6' (1.829 m)   Wt (!) 313 lb (142 kg)   BMI 42.45 kg/m   Physical Exam Vitals reviewed.  Constitutional:      Appearance: Normal appearance.  HENT:     Head: Normocephalic and atraumatic.     Right Ear:  Tympanic membrane normal.     Left Ear: Tympanic membrane normal.     Nose: Nose normal.  Eyes:     Conjunctiva/sclera: Conjunctivae normal.     Pupils: Pupils are equal, round, and reactive to light.  Cardiovascular:     Rate and Rhythm: Normal rate and regular rhythm.     Pulses: Normal pulses.     Heart sounds: Normal heart sounds.     Comments: Trace edema noted of lower extremities bilaterally Pulmonary:     Breath sounds: Wheezing present.  Chest:  Chest wall: No tenderness.  Abdominal:     General: Bowel sounds are normal. There is no distension.     Palpations: Abdomen is soft.     Tenderness: There is no abdominal tenderness.  Musculoskeletal:     Cervical back: Neck supple.  Neurological:     General: No focal deficit present.     Mental Status: He is alert.  Psychiatric:        Mood and Affect: Mood normal.     Assessment/Plan: 1. Visit for preventive health examination Depression screen negative. Health Maintenance reviewed. Preventive schedule discussed and handout given in AVS. Will obtain fasting labs today.  - CBC with Differential/Platelet - Comprehensive metabolic panel - Hemoglobin A1c - Lipid panel - Sedimentation rate  2. Alcohol abuse, daily use Discussed treatment centers -- IP/OP and local support groups. He has cut consumption in half on his own but still has significant intake. He wants to stop smoking first -- just starting Chantix. Will check labs today.  - CBC with Differential/Platelet - Comprehensive metabolic panel  3. Other polyneuropathy 4. Chronic low back pain without sciatica, unspecified back pain laterality Continue Flexeril and Gabapentin. Discussed pain management referral. He will give some thought to this.  - cyclobenzaprine (FLEXERIL) 10 MG tablet; Take 1 tablet (10 mg total) by mouth 3 (three) times daily as needed for muscle spasms.  Dispense: 90 tablet; Refill: 1  5. Cigarette nicotine dependence with  nicotine-induced disorder Chantix sent to pharmacy at last visit. Was on backorder but he is going to pick up. Will have him start as directed. Follow-up monthly until cessation is achieved.   6. Peripheral edema Mild. Lungs without crackles. Will add on Furosemide to see how this helps. BP was above goal today so losartan 50 mg was also added. Follow-up 2 weeks.    This visit occurred during the SARS-CoV-2 public health emergency.  Safety protocols were in place, including screening questions prior to the visit, additional usage of staff PPE, and extensive cleaning of exam room while observing appropriate contact time as indicated for disinfecting solutions.    Leeanne Rio, PA-C

## 2020-06-19 NOTE — Patient Instructions (Signed)
Please go to the lab for blood work.   Our office will call you with your results unless you have chosen to receive results via MyChart.  If your blood work is normal we will follow-up each year for physicals and as scheduled for chronic medical problems.  If anything is abnormal we will treat accordingly and get you in for a follow-up.  I am working on sending in a makeshift starter pack for chantix since they are out of the true starter packs.   We will contact previous pharmacy to see what was given for anxiety to determine if this can be refilled or if we will need to consider another option.   I am adding on losartan 50 mg daily for BP. Also adding on a Furosemide 20 mg daily over the weekend to see how this helps with fluid.   Marland Kitchen

## 2020-06-23 ENCOUNTER — Other Ambulatory Visit: Payer: Self-pay

## 2020-06-23 ENCOUNTER — Telehealth: Payer: Self-pay | Admitting: Physician Assistant

## 2020-06-23 DIAGNOSIS — M2550 Pain in unspecified joint: Secondary | ICD-10-CM

## 2020-06-23 DIAGNOSIS — R718 Other abnormality of red blood cells: Secondary | ICD-10-CM

## 2020-06-23 NOTE — Telephone Encounter (Signed)
Closed due to duplicate messages. See lab notes.

## 2020-06-23 NOTE — Telephone Encounter (Signed)
Pt returned phone call back lab results, please call the home #

## 2020-06-24 ENCOUNTER — Other Ambulatory Visit: Payer: Self-pay | Admitting: Physician Assistant

## 2020-06-24 MED ORDER — ALBUTEROL SULFATE HFA 108 (90 BASE) MCG/ACT IN AERS: 2.0000 | INHALATION_SPRAY | Freq: Four times a day (QID) | RESPIRATORY_TRACT | 0 refills | Status: DC | PRN

## 2020-06-24 MED ORDER — VARENICLINE TARTRATE 0.5 MG PO TABS: ORAL_TABLET | ORAL | 0 refills | Status: DC

## 2020-06-24 MED ORDER — VARENICLINE TARTRATE 1 MG PO TABS: 1.0000 mg | ORAL_TABLET | Freq: Two times a day (BID) | ORAL | 1 refills | Status: DC

## 2020-06-25 ENCOUNTER — Telehealth: Payer: Self-pay | Admitting: Physician Assistant

## 2020-06-25 NOTE — Progress Notes (Signed)
  Chronic Care Management   Note  06/25/2020 Name: Elijah Barton MRN: [id number] DOB: 1956/01/29  Elijah Barton is a 64 y.o. year old male who is a primary care patient of Delorse Limber. I reached out to Elijah Barton by phone today in response to a referral sent by Elijah Barton PCP, [name], PA-C.   Elijah Barton was given information about Chronic Care Management services today including:  1. CCM service includes personalized support from designated clinical staff supervised by his physician, including individualized plan of care and coordination with other care providers 2. 24/7 contact phone numbers for assistance for urgent and routine care needs. 3. Service will only be billed when office clinical staff spend 20 minutes or more in a month to coordinate care. 4. Only one practitioner may furnish and bill the service in a calendar month. 5. The patient may stop CCM services at any time (effective at the end of the month) by phone call to the office staff.   Patient agreed to services and verbal consent obtained.   Follow up plan:   Elijah Barton Upstream Scheduler

## 2020-07-21 ENCOUNTER — Telehealth: Payer: Self-pay | Admitting: Physician Assistant

## 2020-07-21 ENCOUNTER — Other Ambulatory Visit: Payer: Self-pay | Admitting: Physician Assistant

## 2020-07-21 ENCOUNTER — Encounter: Payer: Self-pay | Admitting: Physician Assistant

## 2020-07-21 ENCOUNTER — Telehealth: Payer: Self-pay

## 2020-07-21 MED ORDER — ALBUTEROL SULFATE HFA 108 (90 BASE) MCG/ACT IN AERS: 2.0000 | INHALATION_SPRAY | Freq: Four times a day (QID) | RESPIRATORY_TRACT | 2 refills | Status: DC | PRN

## 2020-07-21 NOTE — Telephone Encounter (Signed)
Would recommend we call to get more information and make sure he does not need ER or URgent Care disposition (SOB, o2 low, etc)

## 2020-07-21 NOTE — Progress Notes (Signed)
Chronic Care Management Pharmacy Assistant   Name: Elijah Barton  MRN: [id number] DOB: 05-16-1956  Reason for Encounter: Medication Review    PCP : [name], PA-C  Allergies:   Allergies  Allergen Reactions  . Penicillins Other (See Comments)    Childhood Allergy Has patient had a PCN reaction causing immediate rash, facial/tongue/throat swelling, SOB or lightheadedness with hypotension: Unknown Has patient had a PCN reaction causing severe rash involving mucus membranes or skin necrosis: Unknown Has patient had a PCN reaction that required hospitalization: Unknown Has patient had a PCN reaction occurring within the last 10 years: Unknown If all of the above answers are "NO", then may proceed with Cephalosporin use.     Medications: Outpatient Encounter Medications as of 07/21/2020  Medication Sig  . albuterol (VENTOLIN HFA) 108 (90 Base) MCG/ACT inhaler Inhale 2 puffs into the lungs every 6 (six) hours as needed for wheezing or shortness of breath.  Marland Kitchen b complex vitamins tablet Take 1 tablet by mouth 3 (three) times a week.   . Coenzyme Q10 (CO Q 10 PO) Take 1 tablet by mouth daily.  . cyclobenzaprine (FLEXERIL) 10 MG tablet Take 1 tablet (10 mg total) by mouth 3 (three) times daily as needed for muscle spasms.  . furosemide (LASIX) 20 MG tablet Take 1 tablet (20 mg total) by mouth daily.  Marland Kitchen gabapentin (NEURONTIN) 300 MG capsule Take 1 capsule (300 mg total) by mouth 3 (three) times daily.  . hydrochlorothiazide (HYDRODIURIL) 25 MG tablet Take 1 tablet (25 mg total) by mouth daily.  Marland Kitchen losartan (COZAAR) 50 MG tablet Take 1 tablet (50 mg total) by mouth daily.  . naproxen (NAPROSYN) 500 MG tablet Take 1 tablet (500 mg total) by mouth 2 (two) times daily.  Marland Kitchen tiotropium (SPIRIVA) 18 MCG inhalation capsule Place 1 capsule (18 mcg total) into inhaler and inhale daily.  . TURMERIC PO Take 1 tablet by mouth 2 (two) times daily.  . varenicline (CHANTIX) 0.5 MG tablet Take 1  tablet (0.5 mg) once daily x 3 days. Then take 1 tablet twice daily. After complete, start 1 mg dose.  . varenicline (CHANTIX) 1 MG tablet Take 1 tablet (1 mg total) by mouth 2 (two) times daily. Begin after completing 0.5 mg tablet prescription.  . YUCCA PO Take 2 capsules by mouth 2 (two) times daily.   No facility-administered encounter medications on file as of 07/21/2020.    Current Diagnosis: Patient Active Problem List   Diagnosis Date Noted  . Back spasm 11/27/2018  . Hypoxia 09/19/2017  . 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 Alcohol abuse, daily use 10/12/2014  . COPD exacerbation ([place]) 09/02/2013  . BMI 37.0-37.9, adult 09/02/2013  . Nicotine addiction 09/02/2013  . Peripheral neuropathy 03/12/2013    Reviewed chart for medication changes ahead of medication coordination call.  No OVs, Consults, or hospital visits since last care coordination call/Pharmacist visit. (If appropriate, list visit date, provider name)  No medication changes indicated OR if recent visit, treatment plan here.  BP Readings from Last 3 Encounters:  06/19/20 (!) 144/82  06/03/20 140/90  07/19/18 (!) 166/98    Lab Results  Component Value Date   HGBA1C 5.2 06/19/2020      Coordinated acute fill for   Cyclobenzaprine 10mg  Take three times a day at Breakfast, Lunch and Bedtime  Spiriva Hand Inhaler 31mcg inhalation capsule Albuterol Aer Inhale two puffs every 6 hours prn Gabapentin 300mg  Take one cap three times daily. Breakfast, dinner, and bedtime.  Naproxen 500mg  Take twice daily. One at breakfast and one at bedtime Chantix 0.5mg  Take 1 tab once daily x3 days (breakfast, dinner and bedtime), then take 2 tab twice daily (breakfast and bedtime) , then start 1mg  dose . (breakfast) to be delivered 07-22-2020.  Patient would like pricing of Chantix 0.5mg  before packaging.   Patient needs refills for Albuterol Aer Inhale two puffs every 6 hours prn and Naproxen 500mg  Take twice daily. (CPP to request)   .  Confirmed delivery date of 07/22/2020, advised patient that pharmacy will contact them the morning of delivery.  Georgiana Shore ,[place] Pharmacist Assistant [phone]     Follow-Up:  Pharmacist Review

## 2020-07-21 NOTE — Progress Notes (Signed)
  Chronic Care Management   Outreach Note  07/21/2020 Name: Elijah Barton MRN: [id number] DOB: [birth year]  Referred by: [name], PA-C Reason for referral : No chief complaint on file.   Third unsuccessful telephone outreach was attempted today. The patient was referred to the pharmacist for assistance with care management and care coordination.   Follow Up Plan:   Lauretta Grill Upstream Scheduler

## 2020-07-21 NOTE — Telephone Encounter (Signed)
Pt says that he took Robitussin DM, he had a horrible cough with nasal congestion, SOB, he denies any fever, and says that he has no Covid symptoms. He states that he is much better since taking the Robitussin DM. He is concerned that he has URI. Pt requests Zpack. He has COPD and has some concerns. Pt also has questions about rx reflls for prescribed Chantix, and Spiriva. Pt was advised to make a virtual visit.

## 2020-07-21 NOTE — Telephone Encounter (Signed)
He can schedule video visit. I would be concerned for covid giving oxygen levels changing but know he has COPD so could just be exacerbation.

## 2020-07-22 ENCOUNTER — Encounter: Payer: Self-pay | Admitting: Physician Assistant

## 2020-07-22 ENCOUNTER — Ambulatory Visit: Payer: Self-pay

## 2020-07-22 ENCOUNTER — Telehealth (INDEPENDENT_AMBULATORY_CARE_PROVIDER_SITE_OTHER): Payer: Medicare Other | Admitting: Physician Assistant

## 2020-07-22 DIAGNOSIS — J42 Unspecified chronic bronchitis: Secondary | ICD-10-CM

## 2020-07-22 DIAGNOSIS — F17219 Nicotine dependence, cigarettes, with unspecified nicotine-induced disorders: Secondary | ICD-10-CM | POA: Diagnosis not present

## 2020-07-22 MED ORDER — BUPROPION HCL ER (SR) 150 MG PO TB12: 150.0000 mg | ORAL_TABLET | Freq: Two times a day (BID) | ORAL | 0 refills | Status: DC

## 2020-07-22 MED ORDER — BUDESONIDE-FORMOTEROL FUMARATE 160-4.5 MCG/ACT IN AERO: 2.0000 | INHALATION_SPRAY | Freq: Two times a day (BID) | RESPIRATORY_TRACT | 12 refills | Status: DC

## 2020-07-22 NOTE — Progress Notes (Unsigned)
Chronic Care Management Pharmacy  Name: Elijah Barton  MRN: [id number] DOB: 1956-01-02  Chief Complaint/ HPI  Haig Prophet,  64 y.o., male presents for their {Initial/Follow-up:3041532} CCM visit with the clinical pharmacist {CHL HP [facility]stream Pharm visit YWVP:[id number]}.  PCP : [name], PA-C  Chronic conditions include:  No diagnosis found.  Office Visits:  ***  Consult Visit: *** Patient Active Problem List   Diagnosis Date Noted  . Back spasm 11/27/2018  . Hypoxia 09/19/2017  . 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 Alcohol abuse, daily use 10/12/2014  . COPD exacerbation ([place]) 09/02/2013  . BMI 37.0-37.9, adult 09/02/2013  . Nicotine addiction 09/02/2013  . Peripheral neuropathy 03/12/2013   Past Surgical History:  Procedure Laterality Date  . LYMPH NODE BIOPSY  1970   neck  . TONSILLECTOMY AND ADENOIDECTOMY  1968   Family History  Problem Relation Age of Onset  . Colon cancer Mother   . Heart disease Mother   . Stroke Mother   . Cancer Mother        lung  . Alcohol abuse Mother   . Arthritis Mother   . Asthma Mother   . COPD Mother   . Depression Mother   . Cirrhosis Father   . Alcohol abuse Father   . Alcohol abuse Brother    Allergies  Allergen Reactions  . Penicillins Other (See Comments)    Childhood Allergy Has patient had a PCN reaction causing immediate rash, facial/tongue/throat swelling, SOB or lightheadedness with hypotension: Unknown Has patient had a PCN reaction causing severe rash involving mucus membranes or skin necrosis: Unknown Has patient had a PCN reaction that required hospitalization: Unknown Has patient had a PCN reaction occurring within the last 10 years: Unknown If all of the above answers are "NO", then may proceed with Cephalosporin use.    Outpatient Encounter Medications as of 07/22/2020  Medication Sig  . albuterol (VENTOLIN HFA) 108 (90 Base) MCG/ACT inhaler Inhale 2 puffs into the lungs every 6 (six) hours as needed for  wheezing or shortness of breath.  Marland Kitchen b complex vitamins tablet Take 1 tablet by mouth 3 (three) times a week.   . Coenzyme Q10 (CO Q 10 PO) Take 1 tablet by mouth daily.  . cyclobenzaprine (FLEXERIL) 10 MG tablet Take 1 tablet (10 mg total) by mouth 3 (three) times daily as needed for muscle spasms.  . furosemide (LASIX) 20 MG tablet Take 1 tablet (20 mg total) by mouth daily.  Marland Kitchen gabapentin (NEURONTIN) 300 MG capsule Take 1 capsule (300 mg total) by mouth 3 (three) times daily.  . hydrochlorothiazide (HYDRODIURIL) 25 MG tablet Take 1 tablet (25 mg total) by mouth daily.  Marland Kitchen losartan (COZAAR) 50 MG tablet Take 1 tablet (50 mg total) by mouth daily.  . naproxen (NAPROSYN) 500 MG tablet Take 1 tablet (500 mg total) by mouth 2 (two) times daily.  Marland Kitchen tiotropium (SPIRIVA) 18 MCG inhalation capsule Place 1 capsule (18 mcg total) into inhaler and inhale daily.  . TURMERIC PO Take 1 tablet by mouth 2 (two) times daily.  . varenicline (CHANTIX) 0.5 MG tablet Take 1 tablet (0.5 mg) once daily x 3 days. Then take 1 tablet twice daily. After complete, start 1 mg dose.  . varenicline (CHANTIX) 1 MG tablet Take 1 tablet (1 mg total) by mouth 2 (two) times daily. Begin after completing 0.5 mg tablet prescription.  . YUCCA PO Take 2 capsules by mouth 2 (two) times daily.   No facility-administered encounter medications on  file as of 07/22/2020.   Patient Care Team    Relationship Specialty Notifications Start End  [name], [place] PCP - General Family Medicine  06/04/20   Madelin Rear, [facility] Pharmacist Pharmacist  06/25/20    Comment: [phone]   Current Diagnosis/Assessment: Goals Addressed   None    Hypertension   BP goal {CHL HP UPSTREAM Pharmacist BP ranges:[phone]}  BP Readings from Last 3 Encounters:  06/19/20 (!) 144/82  06/03/20 140/90  07/19/18 (!) 166/98   Patient has failed these meds in the past: *** Patient checks BP at home {CHL HP BP Monitoring [phone]} Patient  home BP readings are ranging: ***  Patient is currently {CHL Controlled/Uncontrolled:2154061692} on the following medications:  . ***  We discussed {CHL HP Upstream Pharmacy discussion:[phone]}.  Plan  Continue {CHL HP Upstream Pharmacy [phone]}.   Diabetes   A1c goal < ***%  Lab Results  Component Value Date/Time   HGBA1C 5.2 06/19/2020 01:39 PM   HGBA1C 5.0 03/17/2012 03:49 PM    Checking BG: {CHL HP Blood Glucose Monitoring Frequency:[phone]}. Recent FBG readings ***  Previous medications: Patient is currently {CHL Controlled/Uncontrolled:2154061692} on the following medications:  . ***  We discussed: {CHL HP Upstream Pharmacy discussion:[phone]}.  Plan  Continue {CHL HP Upstream Pharmacy [phone]}.  AFIB   Pulse Readings from Last 3 Encounters:  06/19/20 68  07/19/18 81  07/15/18 76   HR *** BPM. Patient is currently {CHL HP BP RATE/[phone]} controlled on the following medications:  . ***  Prevention of stroke in Afib. CHA2DS2/VAS Stroke Risk Points      N/A >= 2 Points: High Risk  1 - 1.99 Points: Medium Risk  0 Points: Low Risk    Last Change: N/A      This score determines the patient's risk of having a stroke if the  patient has atrial fibrillation.      This score is not applicable to this patient. Components are not  calculated.   . ***Denies any abnormal bruising, bleeding from nose or gums or blood in urine or stool. Patient is currently {CHL Controlled/Uncontrolled:2154061692} on the following medications:  . ***  We discussed:  {CHL HP Upstream Pharmacy discussion:[phone]}.  Plan  Continue {CHL HP Upstream Pharmacy [phone]}.  COPD and/or tobacco ***    Last spirometry score: *** Gold Grade: {CHL HP Upstream Pharm COPD Gold DJSHF:[id number]} Current COPD Classification:  {CHL HP Upstream Pharm COPD Classification:[phone]} Eosinophil count:   Lab Results  Component Value Date/Time    EOSPCT 1.2 06/19/2020 01:39 PM                                 Eos (Absolute):  Lab Results  Component Value Date/Time   EOSABS 0.1 06/19/2020 01:39 PM    Patient has failed these meds in past: *** Patient is currently {CHL Controlled/Uncontrolled:2154061692} on the following medications: *** Using maintenance inhaler regularly? {yes/no:20286} Frequency of rescue inhaler use:  {CHL HP Upstream Pharm Inhaler OYDX:4128786767}  Tobacco Status:  Social History   Tobacco Use  Smoking Status Current Every Day Smoker  . Packs/day: 2.00  . Years: 40.00  . Pack years: 80.00  . Types: Cigarettes  Smokeless Tobacco Never Used    Patient smokes {Time to first cigarette:23873} Patient triggers include: {Smoking Triggers:23882} On a scale of 1-10, reports MOTIVATION to quit is *** On a scale of 1-10, reports CONFIDENCE in quitting is *** Previous quit attempts included: ***  Patient is currently {CHL Controlled/Uncontrolled:4450800462} on the following medications:  . ***  We discussed:  {Smoking Cessation Counseling:23883}  Plan  Continue {CHL HP Upstream Pharmacy Plans:[phone]}.  Heart Failure   Type: {type of heart failure:30421350} Last ejection fraction: *** NYHA Class: {CHL HP Upstream Pharm NYHA Class:[phone]} AHA HF Stage: {CHL HP Upstream Pharm AHA HF Stage:[phone]}  Patient has failed these meds in past: *** Patient is currently {CHL Controlled/Uncontrolled:4450800462} on the following medications:  . ***  We discussed {CHL HP Upstream Pharmacy discussion:[phone]}.  Plan  Continue {CHL HP Upstream Pharmacy Plans:[phone]}.  Hyperlipidemia   LDL goal < ***  Lipid Panel     Component Value Date/Time   CHOL 186 06/19/2020 1339   CHOL 131 06/28/2017 1134   TRIG 93.0 06/19/2020 1339   HDL 49.70 06/19/2020 1339   HDL 51 06/28/2017 1134   LDLCALC 117 (H) 06/19/2020 1339   LDLCALC 65 06/28/2017 1134    Hepatic Function Latest Ref Rng & Units  06/19/2020 07/15/2018 05/04/2018  Total Protein 6.0 - 8.3 g/dL 6.6 7.2 6.8  Albumin 3.5 - 5.2 g/dL 4.1 4.0 3.5  AST 0 - 37 U/L 34 39 27  ALT 0 - 53 U/L '23 27 24  ' Alk Phosphatase 39 - 117 U/L 59 64 65  Total Bilirubin 0.2 - 1.2 mg/dL 1.2 1.7(H) 1.7(H)    The 10-year ASCVD risk score Mikey Bussing DC Jr., et al., 2013) is: 20.1%   Values used to calculate the score:     Age: 40 years     Sex: Male     Is Non-Hispanic African American: No     Diabetic: No     Tobacco smoker: Yes     Systolic Blood Pressure: 903 mmHg     Is BP treated: No     HDL Cholesterol: 49.7 mg/dL     Total Cholesterol: 186 mg/dL   Patient has failed these meds in past: *** Patient is currently {CHL Controlled/Uncontrolled:4450800462} on the following medications:  . ***  We discussed:  {CHL HP Upstream Pharmacy discussion:[phone]}.  Plan  Continue {CHL HP Upstream Pharmacy Plans:[phone]}.  Hypothyroidism   Lab Results  Component Value Date/Time   TSH 1.710 06/28/2017 11:34 AM   TSH 6.301 (H) 03/12/2013 08:34 PM    Patient has failed these meds in past: *** Patient is currently {CHL Controlled/Uncontrolled:4450800462} on the following medications:  . ***  We discussed:  {CHL HP Upstream Pharmacy discussion:[phone]}.  Plan  Continue {CHL HP Upstream Pharmacy Plans:[phone]}.  GERD   Patient {Actions; denies-reports:120008::"denies"} recent acid reflux.  Currently {CHL Controlled/Uncontrolled:4450800462} on: . ***  We discussed: Avoidance of potential triggers such as {causes; exacerbators GERD:13199}.  Plan   Continue {CHL HP Upstream Pharmacy Plans:[phone]}.  Osteopenia / Osteoporosis   Last DEXA Scan: ***   No results found for: VD25OH   Patient {is;is not an osteoporosis candidate:23886}  Patient has failed these meds in past: *** Patient is currently {CHL Controlled/Uncontrolled:4450800462} on the following medications:  . ***  We discussed:  {Osteoporosis  Counseling:23892}.  Plan  Continue {CHL HP Upstream Pharmacy Plans:[phone]}.  Depression   PHQ9 SCORE ONLY 06/03/2020 07/19/2018 05/04/2018  PHQ-9 Total Score 4 0 0   Patient has failed these meds in past: *** Patient is currently {CHL Controlled/Uncontrolled:4450800462} on the following medications:  . ***  We discussed:  ***  Plan  Continue {CHL HP Upstream Pharmacy Plans:[phone]}.  ***   Patient has failed these meds in past: *** Patient is currently {CHL Controlled/Uncontrolled:4450800462} on  the following medications:  . ***  We discussed:  ***  Plan  Continue {CHL HP Upstream Pharmacy Plans:[phone]}.  Vaccines   Immunization History  Administered Date(s) Administered  . Influenza,inj,Quad PF,6+ Mos 10/12/2014  . Td 12/20/2018    Reviewed and discussed patient's vaccination history.    Plan  Recommended patient receive *** vaccine in ***.   Medication Management Coordination   Receives prescription medications from:  [facility], [place] [address] Phone: [phone] Fax: [phone]  [facility] - [place], [place] - [address] [place] [address] Phone: [phone] Fax: [phone]    *** Plan  {US Pharmacy PFYT:24462}. ___________________________ SDOH (Social Determinants of Health) assessments performed: Yes.  Future Appointments  Date Time Provider [place]  07/22/2020  2:30 PM Delorse Limber [facility]  08/24/2020  3:00 PM SV-NURSE [facility]  09/16/2020 11:00 AM [facility] CCM PHARMACIST [facility]   Visit follow-up:  . CPA follow-up: ***. Marland Kitchen RPH follow-up: *** month *** visit.  Madelin Rear, Pharm.D., BCGP Clinical Pharmacist [facility] [phone]

## 2020-07-22 NOTE — Progress Notes (Signed)
Virtual Visit via Telephone Note  I connected with Elijah Barton on 07/22/20 at  2:30 PM EDT by telephone and verified that I am speaking with the correct person using two identifiers.  Location: Patient: Home Provider: [place].   I discussed the limitations, risks, security and privacy concerns of performing an evaluation and management service by telephone and the availability of in person appointments. I also discussed with the patient that there may be a patient responsible charge related to this service. The patient expressed understanding and agreed to proceed.  History of Present Illness: Patient presents via telephone today as he is unable to connect to video platform to discuss ongoing COPD symptoms.  Patient states after his recent exacerbation he has continued with cough, mostly dry but occasionally productive of clear sputum.  Denies any fever, chills, sinus symptoms.  Denies any chest pain.  Has noted some intermittent chest tightness without wheezing.  Was taking his Spiriva but felt this made his symptoms worse.  As such she has stopped the medicine and has noted a significant improvement in cough and chest tightness.  Is wanting to discuss other medications for his COPD.   Observations/Objective: Head is normocephalic, atraumatic.  No labored breathing.  Speech is clear and coherent with logical content.  Patient is alert and oriented at baseline.   93% O2 RA BP 150/81 HR 68  Assessment and Plan: 1. Chronic bronchitis, unspecified chronic bronchitis type ([place]) Noted worsening of cough with Spiriva.  This has been discontinued.  Patient does need pulmonology in the area.  He is now agreeable to referral.  Referral placed.  Will start trial of Symbicort while referral is pending.  Patient is to notify us of any new or worsening symptoms. - Ambulatory referral to Pulmonology  2. Cigarette nicotine dependence with nicotine-induced disorder Unable to get  Chantix at present.  He is agreeable to starting Wellbutrin.  Rx Wellbutrin SR 150 mg twice daily sent to pharmacy.  Close follow-up discussed.  Again discussed with patient it is extremely important that we achieve smoking cessation to prevent further deterioration in his respiratory function. - buPROPion (WELLBUTRIN SR) 150 MG 12 hr tablet; Take 1 tablet (150 mg total) by mouth 2 (two) times daily.  Dispense: 60 tablet; Refill: 0   Follow Up Instructions:  I discussed the assessment and treatment plan with the patient. The patient was provided an opportunity to ask questions and all were answered. The patient agreed with the plan and demonstrated an understanding of the instructions.   The patient was advised to call back or seek an in-person evaluation if the symptoms worsen or if the condition fails to improve as anticipated.  I provided 15 minutes of non-face-to-face time during this encounter.   Leeanne Rio, PA-C

## 2020-07-22 NOTE — Patient Instructions (Signed)
Instructions sent to MyChart

## 2020-08-24 ENCOUNTER — Ambulatory Visit: Payer: Medicare Other

## 2020-08-26 ENCOUNTER — Institutional Professional Consult (permissible substitution): Payer: Medicare Other | Admitting: Pulmonary Disease

## 2020-08-31 ENCOUNTER — Ambulatory Visit: Payer: Medicare Other

## 2020-09-14 ENCOUNTER — Telehealth: Payer: Self-pay

## 2020-09-14 NOTE — Progress Notes (Unsigned)
Chronic Care Management Pharmacy Assistant   Name: Nathanie Ottley  MRN: [id number] DOB: 09-Aug-1956  Reason for Encounter: Medication Review / Initial Visit    Elijah Barton,  64 y.o. , male presents for their Initial CCM visit with the clinical pharmacist via telephone.  PCP : [name], PA-C  Allergies:   Allergies  Allergen Reactions  . Penicillins Other (See Comments)    Childhood Allergy Has patient had a PCN reaction causing immediate rash, facial/tongue/throat swelling, SOB or lightheadedness with hypotension: Unknown Has patient had a PCN reaction causing severe rash involving mucus membranes or skin necrosis: Unknown Has patient had a PCN reaction that required hospitalization: Unknown Has patient had a PCN reaction occurring within the last 10 years: Unknown If all of the above answers are "NO", then may proceed with Cephalosporin use.     Medications: Outpatient Encounter Medications as of 09/14/2020  Medication Sig  . albuterol (VENTOLIN HFA) 108 (90 Base) MCG/ACT inhaler Inhale 2 puffs into the lungs every 6 (six) hours as needed for wheezing or shortness of breath.  Marland Kitchen b complex vitamins tablet Take 1 tablet by mouth 3 (three) times a week.   . budesonide-formoterol (SYMBICORT) 160-4.5 MCG/ACT inhaler Inhale 2 puffs into the lungs in the morning and at bedtime.  Marland Kitchen buPROPion (WELLBUTRIN SR) 150 MG 12 hr tablet Take 1 tablet (150 mg total) by mouth 2 (two) times daily.  . Coenzyme Q10 (CO Q 10 PO) Take 1 tablet by mouth daily.  . cyclobenzaprine (FLEXERIL) 10 MG tablet Take 1 tablet (10 mg total) by mouth 3 (three) times daily as needed for muscle spasms.  . furosemide (LASIX) 20 MG tablet Take 1 tablet (20 mg total) by mouth daily.  Marland Kitchen gabapentin (NEURONTIN) 300 MG capsule Take 1 capsule (300 mg total) by mouth 3 (three) times daily.  . hydrochlorothiazide (HYDRODIURIL) 25 MG tablet Take 1 tablet (25 mg total) by mouth daily.  Marland Kitchen losartan (COZAAR) 50 MG  tablet Take 1 tablet (50 mg total) by mouth daily.  . naproxen (NAPROSYN) 500 MG tablet Take 1 tablet (500 mg total) by mouth 2 (two) times daily.  . TURMERIC PO Take 1 tablet by mouth 2 (two) times daily.  . YUCCA PO Take 2 capsules by mouth 2 (two) times daily. (Patient not taking: Reported on 07/22/2020)   No facility-administered encounter medications on file as of 09/14/2020.    Current Diagnosis: Patient Active Problem List   Diagnosis Date Noted  . Chronic bronchitis ([place]) 07/22/2020  . Back spasm 11/27/2018  . Hypoxia 09/19/2017  . 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 Alcohol abuse, daily use 10/12/2014  . COPD exacerbation ([place]) 09/02/2013  . BMI 37.0-37.9, adult 09/02/2013  . Nicotine addiction 09/02/2013  . Peripheral neuropathy 03/12/2013    Have you seen any other providers since your last visit? **{YES NO:22349}  Any changes in your medications or health? {YES NO:22349}  Any side effects from any medications? {YES NO:22349}  Do you have an symptoms or problems not managed by your medications? {YES NO:22349}  Any concerns about your health right now? {YES NO:22349}  Has your provider asked that you check blood pressure, blood sugar, or follow special diet at home? {YES NO:22349}  Do you get any type of exercise on a regular basis? {YES NO:22349}  Can you think of a goal you would like to reach for your health? ***  Do you have any problems getting your medications? {YES NO:22349}  Is there anything that you  would like to discuss during the appointment? ***  Please bring medications and supplements to appointment    Georgiana Shore ,[facility] Clinical Pharmacist Assistant [phone]     Follow-Up:  Pharmacist Review

## 2020-09-16 ENCOUNTER — Ambulatory Visit: Payer: Medicare Other

## 2020-09-16 NOTE — Progress Notes (Unsigned)
Chronic Care Management Pharmacy  Name: Elijah Barton   MRN: [id number] DOB: [birth year]  Chief Complaint/ HPI  Elijah Barton,  64 y.o., male presents for their Initial CCM visit with the clinical pharmacist via telephone due to COVID-19 Pandemic.  PCP : [name], PA-C  Chronic conditions include:  No diagnosis found.  Office Visits:  07/22/2020 (PCP): unable to get chantix, agreeable to starting wellbutrin rx - 150 mg SR twice daily. Smoking cessation strongly urged. Worsening cough with Spiriva, symbicort started. Referred to pulminology.  06/19/2020 (PCP): losartan 50 mg daily added for BP, furosemide 20 mg daily at least over the weekend to see if helps with fluid  Patient Active Problem List   Diagnosis Date Noted  . Chronic bronchitis ([place]) 07/22/2020  . Back spasm 11/27/2018  . Hypoxia 09/19/2017  . 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 Alcohol abuse, daily use 10/12/2014  . COPD exacerbation ([place]) 09/02/2013  . BMI 37.0-37.9, adult 09/02/2013  . Nicotine addiction 09/02/2013  . Peripheral neuropathy 03/12/2013   Past Surgical History:  Procedure Laterality Date  . LYMPH NODE BIOPSY  1970   neck  . TONSILLECTOMY AND ADENOIDECTOMY  1968   Family History  Problem Relation Age of Onset  . Colon cancer Mother   . Heart disease Mother   . Stroke Mother   . Cancer Mother        lung  . Alcohol abuse Mother   . Arthritis Mother   . Asthma Mother   . COPD Mother   . Depression Mother   . Cirrhosis Father   . Alcohol abuse Father   . Alcohol abuse Brother    Allergies  Allergen Reactions  . Penicillins Other (See Comments)    Childhood Allergy Has patient had a PCN reaction causing immediate rash, facial/tongue/throat swelling, SOB or lightheadedness with hypotension: Unknown Has patient had a PCN reaction causing severe rash involving mucus membranes or skin necrosis: Unknown Has patient had a PCN reaction that required hospitalization: Unknown Has patient had a PCN  reaction occurring within the last 10 years: Unknown If all of the above answers are "NO", then may proceed with Cephalosporin use.    Outpatient Encounter Medications as of 09/16/2020  Medication Sig  . albuterol (VENTOLIN HFA) 108 (90 Base) MCG/ACT inhaler Inhale 2 puffs into the lungs every 6 (six) hours as needed for wheezing or shortness of breath.  Marland Kitchen b complex vitamins tablet Take 1 tablet by mouth 3 (three) times a week.   . budesonide-formoterol (SYMBICORT) 160-4.5 MCG/ACT inhaler Inhale 2 puffs into the lungs in the morning and at bedtime.  Marland Kitchen buPROPion (WELLBUTRIN SR) 150 MG 12 hr tablet Take 1 tablet (150 mg total) by mouth 2 (two) times daily.  . Coenzyme Q10 (CO Q 10 PO) Take 1 tablet by mouth daily.  . cyclobenzaprine (FLEXERIL) 10 MG tablet Take 1 tablet (10 mg total) by mouth 3 (three) times daily as needed for muscle spasms.  . furosemide (LASIX) 20 MG tablet Take 1 tablet (20 mg total) by mouth daily.  Marland Kitchen gabapentin (NEURONTIN) 300 MG capsule Take 1 capsule (300 mg total) by mouth 3 (three) times daily.  . hydrochlorothiazide (HYDRODIURIL) 25 MG tablet Take 1 tablet (25 mg total) by mouth daily.  Marland Kitchen losartan (COZAAR) 50 MG tablet Take 1 tablet (50 mg total) by mouth daily.  . naproxen (NAPROSYN) 500 MG tablet Take 1 tablet (500 mg total) by mouth 2 (two) times daily.  . TURMERIC PO Take 1 tablet by  mouth 2 (two) times daily.  . YUCCA PO Take 2 capsules by mouth 2 (two) times daily. (Patient not taking: Reported on 07/22/2020)   No facility-administered encounter medications on file as of 09/16/2020.   Patient Care Team    Relationship Specialty Notifications Start End  [name], [place] PCP - General Family Medicine  06/04/20   Madelin Rear, [facility] Pharmacist Pharmacist  06/25/20    Comment: [phone]   Current Diagnosis/Assessment: Goals Addressed   None    Hypertension   BP goal {CHL HP UPSTREAM Pharmacist BP ranges:[phone]}  BP Readings from Last 3  Encounters:  06/19/20 (!) 144/82  06/03/20 140/90  07/19/18 (!) 166/98   Patient has failed these meds in the past: *** Patient checks BP at home {CHL HP BP Monitoring [phone]} Patient home BP readings are ranging: ***  Patient is currently {CHL Controlled/[phone]} on the following medications:  . ***  We discussed {CHL HP Upstream Pharmacy discussion:[id number]}.  Plan  Continue {CHL HP Upstream Pharmacy [phone]}.   Diabetes   A1c goal < ***%  Lab Results  Component Value Date/Time   HGBA1C 5.2 06/19/2020 01:39 PM   HGBA1C 5.0 03/17/2012 03:49 PM    Checking BG: {CHL HP Blood Glucose Monitoring Frequency:[phone]}. Recent FBG readings ***  Previous medications: Patient is currently {CHL Controlled/[phone]} on the following medications:  . ***  We discussed: {CHL HP Upstream Pharmacy discussion:9475864628}.  Plan  Continue {CHL HP Upstream Pharmacy [phone]}.  AFIB   Pulse Readings from Last 3 Encounters:  06/19/20 68  07/19/18 81  07/15/18 76   HR *** BPM. Patient is currently {CHL HP BP RATE/RHYTHM:[phone]} controlled on the following medications:  . ***  Prevention of stroke in Afib. CHA2DS2/VAS Stroke Risk Points      N/A >= 2 Points: High Risk  1 - 1.99 Points: Medium Risk  0 Points: Low Risk    Last Change: N/A      This score determines the patient's risk of having a stroke if the  patient has atrial fibrillation.      This score is not applicable to this patient. Components are not  calculated.   . ***Denies any abnormal bruising, bleeding from nose or gums or blood in urine or stool. Patient is currently {CHL Controlled/[phone]} on the following medications:  . ***  We discussed:  {CHL HP Upstream Pharmacy discussion:9475864628}.  Plan  Continue {CHL HP Upstream Pharmacy [phone]}.  COPD and/or tobacco ***    Last spirometry score: *** Gold Grade:  {CHL HP Upstream Pharm COPD Gold JOINO:[id number]} Current COPD Classification:  {CHL HP Upstream Pharm COPD Classification:[phone]} Eosinophil count:   Lab Results  Component Value Date/Time   EOSPCT 1.2 06/19/2020 01:39 PM                                 Eos (Absolute):  Lab Results  Component Value Date/Time   EOSABS 0.1 06/19/2020 01:39 PM    Patient has failed these meds in past: *** Patient is currently {CHL Controlled/[phone]} on the following medications: *** Using maintenance inhaler regularly? {yes/no:20286} Frequency of rescue inhaler use:  {CHL HP Upstream Pharm Inhaler JGGE:[id number]}  Tobacco Status:  Social History   Tobacco Use  Smoking Status Current Every Day Smoker  . Packs/day: 2.00  . Years: 40.00  . Pack years: 80.00  . Types: Cigarettes  Smokeless Tobacco Never Used    Patient smokes {Time to first cigarette:23873}  Patient triggers include: {Smoking Triggers:23882} On a scale of 1-10, reports MOTIVATION to quit is *** On a scale of 1-10, reports CONFIDENCE in quitting is *** Previous quit attempts included: *** Patient is currently {CHL Controlled/Uncontrolled:7141963938} on the following medications:  . ***  We discussed:  {Smoking Cessation Counseling:23883}  Plan  Continue {CHL HP Upstream Pharmacy Plans:[phone]}.  Heart Failure   Type: {type of heart failure:30421350} Last ejection fraction: *** NYHA Class: {CHL HP Upstream Pharm NYHA Class:[phone]} AHA HF Stage: {CHL HP Upstream Pharm AHA HF [phone]}  Patient has failed these meds in past: *** Patient is currently {CHL Controlled/Uncontrolled:7141963938} on the following medications:  . ***  We discussed {CHL HP Upstream Pharmacy discussion:[phone]}.  Plan  Continue {CHL HP Upstream Pharmacy Plans:[phone]}.  Hyperlipidemia   LDL goal < ***  Lipid Panel     Component Value Date/Time   CHOL 186 06/19/2020 1339   CHOL 131 06/28/2017  1134   TRIG 93.0 06/19/2020 1339   HDL 49.70 06/19/2020 1339   HDL 51 06/28/2017 1134   LDLCALC 117 (H) 06/19/2020 1339   LDLCALC 65 06/28/2017 1134    Hepatic Function Latest Ref Rng & Units 06/19/2020 07/15/2018 05/04/2018  Total Protein 6.0 - 8.3 g/dL 6.6 7.2 6.8  Albumin 3.5 - 5.2 g/dL 4.1 4.0 3.5  AST 0 - 37 U/L 34 39 27  ALT 0 - 53 U/L '23 27 24  ' Alk Phosphatase 39 - 117 U/L 59 64 65  Total Bilirubin 0.2 - 1.2 mg/dL 1.2 1.7(H) 1.7(H)    The 10-year ASCVD risk score Mikey Bussing DC Jr., et al., 2013) is: 20.1%   Values used to calculate the score:     Age: 26 years     Sex: Male     Is Non-Hispanic African American: No     Diabetic: No     Tobacco smoker: Yes     Systolic Blood Pressure: 034 mmHg     Is BP treated: No     HDL Cholesterol: 49.7 mg/dL     Total Cholesterol: 186 mg/dL   Patient has failed these meds in past: *** Patient is currently {CHL Controlled/Uncontrolled:7141963938} on the following medications:  . ***  We discussed:  {CHL HP Upstream Pharmacy discussion:[phone]}.  Plan  Continue {CHL HP Upstream Pharmacy Plans:[phone]}.  Hypothyroidism   Lab Results  Component Value Date/Time   TSH 1.710 06/28/2017 11:34 AM   TSH 6.301 (H) 03/12/2013 08:34 PM    Patient has failed these meds in past: *** Patient is currently {CHL Controlled/Uncontrolled:7141963938} on the following medications:  . ***  We discussed:  {CHL HP Upstream Pharmacy discussion:[phone]}.  Plan  Continue {CHL HP Upstream Pharmacy Plans:[phone]}.  GERD   Patient {Actions; denies-reports:120008::"denies"} recent acid reflux.  Currently {CHL Controlled/Uncontrolled:7141963938} on: . ***  We discussed: Avoidance of potential triggers such as {causes; exacerbators GERD:13199}.  Plan   Continue {CHL HP Upstream Pharmacy Plans:[phone]}.  Osteopenia / Osteoporosis   Last DEXA Scan: ***   No results found for: VD25OH   Patient {is;is not an osteoporosis  candidate:23886}  Patient has failed these meds in past: *** Patient is currently {CHL Controlled/Uncontrolled:7141963938} on the following medications:  . ***  We discussed:  {Osteoporosis Counseling:23892}.  Plan  Continue {CHL HP Upstream Pharmacy Plans:[phone]}.  Depression   PHQ9 SCORE ONLY 06/03/2020 07/19/2018 05/04/2018  PHQ-9 Total Score 4 0 0   Patient has failed these meds in past: *** Patient is currently {CHL Controlled/Uncontrolled:7141963938} on the following medications:  . ***  We discussed:  ***  Plan  Continue {CHL HP Upstream Pharmacy [phone]}.  ***   Patient has failed these meds in past: *** Patient is currently {CHL Controlled/Uncontrolled:([phone]} on the following medications:  . ***  We discussed:  ***  Plan  Continue {CHL HP Upstream Pharmacy [phone]}.  Vaccines   Immunization History  Administered Date(s) Administered  . Influenza,inj,Quad PF,6+ Mos 10/12/2014  . PFIZER SARS-COV-2 Vaccination 07/22/2020  . Td 12/20/2018    Reviewed and discussed patient's vaccination history.    Plan  Recommended patient receive *** vaccine in ***.   Medication Management Coordination   Receives prescription medications from:  [facility], [place] [address] Phone: [phone] Fax: [phone]  [facility] - [place], [place] - [address] [place] [address] Phone: [phone] Fax: [phone]    *** Plan  {US Pharmacy SMMO:06986}. ___________________________ SDOH (Social Determinants of Health) assessments performed: Yes.  Future Appointments  Date Time Provider [place]  09/16/2020 11:00 AM [facility] CCM PHARMACIST [facility] PEC   Visit follow-up:  . CPA follow-up: ***. Marland Kitchen RPH follow-up: *** month *** visit.  Madelin Rear, Pharm.D., BCGP Clinical  Pharmacist [facility] [phone]

## 2020-09-16 NOTE — Chronic Care Management (AMB) (Signed)
  Chronic Care Management   Outreach Note   Name: Elijah Barton MRN: [id number] DOB: 1956/07/03  Referred by: [name], PA-C Reason for referral: Telephone Appointment with [place] Pharmacist, Madelin Rear.   An unsuccessful telephone outreach was attempted today. The patient was referred to the pharmacist for assistance with care management and care coordination.   Telephone appointment with clinical pharmacist today (09/16/2020) at 11am. If patient immediately returns call, transfer to [phone]. Otherwise, please provide this number so patient can reschedule visit.   Madelin Rear, Pharm.D., BCGP Clinical Pharmacist [facility] [phone]

## 2020-12-29 ENCOUNTER — Telehealth (INDEPENDENT_AMBULATORY_CARE_PROVIDER_SITE_OTHER): Payer: Medicare Other | Admitting: Family Medicine

## 2020-12-29 ENCOUNTER — Other Ambulatory Visit: Payer: Self-pay

## 2020-12-29 ENCOUNTER — Encounter: Payer: Self-pay | Admitting: Family Medicine

## 2020-12-29 DIAGNOSIS — J441 Chronic obstructive pulmonary disease with (acute) exacerbation: Secondary | ICD-10-CM

## 2020-12-29 MED ORDER — ALBUTEROL SULFATE HFA 108 (90 BASE) MCG/ACT IN AERS: 2.0000 | INHALATION_SPRAY | Freq: Four times a day (QID) | RESPIRATORY_TRACT | 2 refills | Status: DC | PRN

## 2020-12-29 MED ORDER — DOXYCYCLINE HYCLATE 100 MG PO TABS: 100.0000 mg | ORAL_TABLET | Freq: Two times a day (BID) | ORAL | 0 refills | Status: DC

## 2020-12-29 NOTE — Progress Notes (Signed)
I connected with  Elijah Barton on 12/29/20 by a video enabled telemedicine application and verified that I am speaking with the correct person using two identifiers.   I discussed the limitations of evaluation and management by telemedicine. The patient expressed understanding and agreed to proceed.

## 2020-12-29 NOTE — Progress Notes (Signed)
Virtual Visit via Video   I connected with patient on 12/29/20 at  8:30 AM EDT by a video enabled telemedicine application and verified that I am speaking with the correct person using two identifiers.  Location patient: Home Location provider: [facility], Office Persons participating in the virtual visit: Patient, Provider, [place] (Sabrina M)  I discussed the limitations of evaluation and management by telemedicine and the availability of in person appointments. The patient expressed understanding and agreed to proceed.  Subjective:   HPI:   Cough- 'i've had respiratory issues for the past 4 days.  I have a productive cough, my sputum is green'.  + SOB w/ exertion.  Pt is not on Symbicort due to cost.  Pt needs refill on Albuterol.  Denies wheezing or chest tightness.  No fevers.  No body aches or chills.  Pt's GF is frequently sick.  Pt never went to his Pulmonary appt.  Pt is an alcoholic which puts him at higher risk for bacterial infection  ROS:   See pertinent positives and negatives per HPI.  Patient Active Problem List   Diagnosis Date Noted  . Chronic bronchitis ([place]) 07/22/2020  . Back spasm 11/27/2018  . Hypoxia 09/19/2017  . 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 Alcohol abuse, daily use 10/12/2014  . COPD exacerbation ([place]) 09/02/2013  . BMI 37.0-37.9, adult 09/02/2013  . Nicotine addiction 09/02/2013  . Peripheral neuropathy 03/12/2013    Social History   Tobacco Use  . Smoking status: Current Every Day Smoker    Packs/day: 2.00    Years: 40.00    Pack years: 80.00    Types: Cigarettes  . Smokeless tobacco: Never Used  Substance Use Topics  . Alcohol use: Yes    Alcohol/week: 15.0 standard drinks    Types: 15 Standard drinks or equivalent per week    Comment: 0-20 drinks/week. Pint of Liquor day     Current Outpatient Medications:  .  albuterol (VENTOLIN HFA) 108 (90 Base) MCG/ACT inhaler, Inhale 2 puffs into the lungs every 6 (six) hours as needed for  wheezing or shortness of breath., Disp: 8 g, Rfl: 2 .  b complex vitamins tablet, Take 1 tablet by mouth 3 (three) times a week.  (Patient not taking: Reported on 12/29/2020), Disp: , Rfl:  .  budesonide-formoterol (SYMBICORT) 160-4.5 MCG/ACT inhaler, Inhale 2 puffs into the lungs in the morning and at bedtime. (Patient not taking: Reported on 12/29/2020), Disp: 1 each, Rfl: 12 .  buPROPion (WELLBUTRIN SR) 150 MG 12 hr tablet, Take 1 tablet (150 mg total) by mouth 2 (two) times daily. (Patient not taking: Reported on 12/29/2020), Disp: 60 tablet, Rfl: 0 .  Coenzyme Q10 (CO Q 10 PO), Take 1 tablet by mouth daily. (Patient not taking: Reported on 12/29/2020), Disp: , Rfl:  .  cyclobenzaprine (FLEXERIL) 10 MG tablet, Take 1 tablet (10 mg total) by mouth 3 (three) times daily as needed for muscle spasms. (Patient not taking: Reported on 12/29/2020), Disp: 90 tablet, Rfl: 1 .  furosemide (LASIX) 20 MG tablet, Take 1 tablet (20 mg total) by mouth daily. (Patient not taking: Reported on 12/29/2020), Disp: 30 tablet, Rfl: 3 .  gabapentin (NEURONTIN) 300 MG capsule, Take 1 capsule (300 mg total) by mouth 3 (three) times daily. (Patient not taking: Reported on 12/29/2020), Disp: 90 capsule, Rfl: 1 .  hydrochlorothiazide (HYDRODIURIL) 25 MG tablet, Take 1 tablet (25 mg total) by mouth daily. (Patient not taking: Reported on 12/29/2020), Disp: 90 tablet, Rfl: 3 .  losartan (COZAAR) 50 MG tablet, Take 1 tablet (50 mg total) by mouth daily. (Patient not taking: Reported on 12/29/2020), Disp: 90 tablet, Rfl: 3 .  naproxen (NAPROSYN) 500 MG tablet, Take 1 tablet (500 mg total) by mouth 2 (two) times daily. (Patient not taking: Reported on 12/29/2020), Disp: 30 tablet, Rfl: 0 .  TURMERIC PO, Take 1 tablet by mouth 2 (two) times daily. (Patient not taking: Reported on 12/29/2020), Disp: , Rfl:  .  YUCCA PO, Take 2 capsules by mouth 2 (two) times daily. (Patient not taking: No sig reported), Disp: , Rfl:   Allergies  Allergen  Reactions  . Penicillins Other (See Comments)    Childhood Allergy Has patient had a PCN reaction causing immediate rash, facial/tongue/throat swelling, SOB or lightheadedness with hypotension: Unknown Has patient had a PCN reaction causing severe rash involving mucus membranes or skin necrosis: Unknown Has patient had a PCN reaction that required hospitalization: Unknown Has patient had a PCN reaction occurring within the last 10 years: Unknown If all of the above answers are "NO", then may proceed with Cephalosporin use.     Objective:   There were no vitals taken for this visit. AAOx3, NAD NCAT, EOMI No obvious CN deficits Pt is able to speak clearly, coherently without shortness of breath or increased work of breathing. No cough heard, no notable wheeze Thought process is linear.  Mood is appropriate.    Assessment and Plan:   COPD exacerbation- new to provider, recurrent issue for pt.  States he is not able to afford ICS and he did not go to his previous pulmonary referral.  The fact that he's an alcoholic puts him at higher risk for bacterial infxn.  Start Doxycycline.  Refill Albuterol.  Refer back to pulmonary.  Pt is also in need of new PCP since his is no longer seeing patients.  He will need to call and establish ASAP.  Annye Asa, MD 12/29/2020

## 2021-01-22 ENCOUNTER — Telehealth: Payer: Self-pay

## 2021-01-22 NOTE — Telephone Encounter (Signed)
Patient wants to know if he can double the medication

## 2021-01-22 NOTE — Telephone Encounter (Signed)
Pt called requesting permission to double his intake of furosemide due to having increase fluid retention on his legs. Please advise.

## 2021-01-22 NOTE — Telephone Encounter (Signed)
Patient wants to know is it okay if he refill his furosemide due to fluid retention.

## 2021-01-26 ENCOUNTER — Ambulatory Visit (INDEPENDENT_AMBULATORY_CARE_PROVIDER_SITE_OTHER): Payer: Medicare Other | Admitting: Registered Nurse

## 2021-01-26 ENCOUNTER — Ambulatory Visit (HOSPITAL_BASED_OUTPATIENT_CLINIC_OR_DEPARTMENT_OTHER)
Admission: RE | Admit: 2021-01-26 | Discharge: 2021-01-26 | Disposition: A | Discharge status: Home / Self Care | Payer: Medicare Other | Source: Ambulatory Visit | Attending: Registered Nurse | Admitting: Registered Nurse

## 2021-01-26 ENCOUNTER — Encounter: Payer: Self-pay | Admitting: Registered Nurse

## 2021-01-26 ENCOUNTER — Other Ambulatory Visit: Payer: Self-pay

## 2021-01-26 VITALS — BP 138/84 | HR 77 | Temp 98.1°F | Resp 18 | Ht 72.0 in | Wt 326.0 lb

## 2021-01-26 DIAGNOSIS — R0600 Dyspnea, unspecified: Secondary | ICD-10-CM

## 2021-01-26 DIAGNOSIS — Z13220 Encounter for screening for lipoid disorders: Secondary | ICD-10-CM | POA: Diagnosis not present

## 2021-01-26 DIAGNOSIS — Z13228 Encounter for screening for other metabolic disorders: Secondary | ICD-10-CM

## 2021-01-26 DIAGNOSIS — R609 Edema, unspecified: Secondary | ICD-10-CM

## 2021-01-26 DIAGNOSIS — R0609 Other forms of dyspnea: Secondary | ICD-10-CM

## 2021-01-26 DIAGNOSIS — R635 Abnormal weight gain: Secondary | ICD-10-CM | POA: Insufficient documentation

## 2021-01-26 DIAGNOSIS — Z130 Encounter for screening for diseases of the blood and blood-forming organs and certain disorders involving the immune mechanism: Secondary | ICD-10-CM

## 2021-01-26 DIAGNOSIS — Z1329 Encounter for screening for other suspected endocrine disorder: Secondary | ICD-10-CM | POA: Diagnosis not present

## 2021-01-26 DIAGNOSIS — J449 Chronic obstructive pulmonary disease, unspecified: Secondary | ICD-10-CM | POA: Diagnosis not present

## 2021-01-26 DIAGNOSIS — J441 Chronic obstructive pulmonary disease with (acute) exacerbation: Secondary | ICD-10-CM

## 2021-01-26 DIAGNOSIS — E569 Vitamin deficiency, unspecified: Secondary | ICD-10-CM | POA: Diagnosis not present

## 2021-01-26 DIAGNOSIS — F17219 Nicotine dependence, cigarettes, with unspecified nicotine-induced disorders: Secondary | ICD-10-CM

## 2021-01-26 DIAGNOSIS — F1020 Alcohol dependence, uncomplicated: Secondary | ICD-10-CM

## 2021-01-26 DIAGNOSIS — R0602 Shortness of breath: Secondary | ICD-10-CM | POA: Diagnosis not present

## 2021-01-26 DIAGNOSIS — F1010 Alcohol abuse, uncomplicated: Secondary | ICD-10-CM

## 2021-01-26 LAB — CBC WITH DIFFERENTIAL/PLATELET
Basophils Absolute: 0 10*3/uL (ref 0.0–0.1)
Basophils Relative: 0.7 % (ref 0.0–3.0)
Eosinophils Absolute: 0.1 10*3/uL (ref 0.0–0.7)
Eosinophils Relative: 2.3 % (ref 0.0–5.0)
HCT: 48.2 % (ref 39.0–52.0)
Hemoglobin: 16.4 g/dL (ref 13.0–17.0)
Lymphocytes Relative: 23.5 % (ref 12.0–46.0)
Lymphs Abs: 1.2 10*3/uL (ref 0.7–4.0)
MCHC: 34 g/dL (ref 30.0–36.0)
MCV: 101.7 fl — ABNORMAL HIGH (ref 78.0–100.0)
Monocytes Absolute: 0.5 10*3/uL (ref 0.1–1.0)
Monocytes Relative: 9.4 % (ref 3.0–12.0)
Neutro Abs: 3.1 10*3/uL (ref 1.4–7.7)
Neutrophils Relative %: 64.1 % (ref 43.0–77.0)
Platelets: 173 10*3/uL (ref 150.0–400.0)
RBC: 4.74 Mil/uL (ref 4.22–5.81)
RDW: 12.8 % (ref 11.5–15.5)
WBC: 4.9 10*3/uL (ref 4.0–10.5)

## 2021-01-26 LAB — LIPID PANEL
Cholesterol: 165 mg/dL (ref 0–200)
HDL: 58.1 mg/dL (ref >39.00)
LDL Cholesterol: 89 mg/dL (ref 0–99)
NonHDL: 106.48
Total CHOL/HDL Ratio: 3
Triglycerides: 85 mg/dL (ref 0.0–149.0)
VLDL: 17 mg/dL (ref 0.0–40.0)

## 2021-01-26 LAB — COMPREHENSIVE METABOLIC PANEL
ALT: 20 U/L (ref 0–53)
AST: 19 U/L (ref 0–37)
Albumin: 3.9 g/dL (ref 3.5–5.2)
Alkaline Phosphatase: 68 U/L (ref 39–117)
BUN: 18 mg/dL (ref 6–23)
CO2: 33 mEq/L — ABNORMAL HIGH (ref 19–32)
Calcium: 9.5 mg/dL (ref 8.4–10.5)
Chloride: 96 mEq/L (ref 96–112)
Creatinine, Ser: 0.81 mg/dL (ref 0.40–1.50)
GFR: 92.4 mL/min (ref >60.00)
Glucose, Bld: 106 mg/dL — ABNORMAL HIGH (ref 70–99)
Potassium: 4 mEq/L (ref 3.5–5.1)
Sodium: 139 mEq/L (ref 135–145)
Total Bilirubin: 0.7 mg/dL (ref 0.2–1.2)
Total Protein: 7.1 g/dL (ref 6.0–8.3)

## 2021-01-26 LAB — AMMONIA: Ammonia: 55 umol/L — ABNORMAL HIGH (ref 11–35)

## 2021-01-26 LAB — VITAMIN B12: Vitamin B-12: 112 pg/mL — ABNORMAL LOW (ref 211–911)

## 2021-01-26 LAB — VITAMIN D 25 HYDROXY (VIT D DEFICIENCY, FRACTURES): VITD: 18.09 ng/mL — ABNORMAL LOW (ref 30.00–100.00)

## 2021-01-26 LAB — BRAIN NATRIURETIC PEPTIDE: Pro B Natriuretic peptide (BNP): 39 pg/mL (ref 0.0–100.0)

## 2021-01-26 LAB — TSH: TSH: 3.9 u[IU]/mL (ref 0.35–4.50)

## 2021-01-26 MED ORDER — QVAR REDIHALER 80 MCG/ACT IN AERB: 2.0000 | INHALATION_SPRAY | Freq: Two times a day (BID) | RESPIRATORY_TRACT | 5 refills | Status: DC

## 2021-01-26 MED ORDER — BUPROPION HCL ER (SR) 150 MG PO TB12: 150.0000 mg | ORAL_TABLET | Freq: Two times a day (BID) | ORAL | 0 refills | Status: DC

## 2021-01-26 NOTE — Addendum Note (Signed)
Addended by: Patrcia Dolly on: 01/26/2021 01:37 PM   Modules accepted: Orders

## 2021-01-26 NOTE — Progress Notes (Signed)
Established Patient Office Visit  Subjective:  Patient ID: Elijah Barton, male    DOB: 11/20/1955  Age: 65 y.o. MRN: [id number]  CC:  Chief Complaint  Patient presents with  . Edema    Pt has had edema on both legs x 1 month possibly longer, notes he has SOB with exertion, and fatigue following most any activity. Has also gained weight since last visit, pt has a reduced work load right now thinking this may effect that     HPI [name] presents for worsening fatigue   Has some requests: wants something to help eliquis, wants to increase gabapentin. Concerns: cannot afford home oxygen, cannot afford symbicort, wants to quit smoking  C/o DOE, fatigue, shob, weight gain via fluid retention. Ongoing for a year or two, but much worse over past month. Has been dealing with illnesses of a loved one, and as such has put his own health on hold.  Histories reviewed with patient, who has a number of factors that may contribute to his symptoms including HTN, HLD, fatty liver, depression, alcohol dependence, arthritis, and chronic pain.  Has not seen a cardiologist. Sees a pulmonologist intermittently.   Smokes 1+ PPD from age 71 to present. No hx of low dose CT lung ca screen, but interested in this.   Today denies cough, fever, LOC, lightheadedness, dizziness, chest pain, pleuritic pain, and other symptoms beyond those listed above   Past Medical History:  Diagnosis Date  . Alcohol abuse   . Allergies   . Arthritis   . Back pain   . Cataracts, bilateral   . COPD (chronic obstructive pulmonary disease) ([place])   . Fatty liver   . Glaucoma   . History of depression   . Joint pain    all over except for hip  . Peripheral neuropathy   . Substance abuse [facility])     Past Surgical History:  Procedure Laterality Date  . LYMPH NODE BIOPSY  1970   neck  . TONSILLECTOMY AND ADENOIDECTOMY  1968    Family History  Problem Relation Age of Onset  . Colon cancer Mother   . Heart  disease Mother   . Stroke Mother   . Cancer Mother        lung  . Alcohol abuse Mother   . Arthritis Mother   . Asthma Mother   . COPD Mother   . Depression Mother   . Cirrhosis Father   . Alcohol abuse Father   . Alcohol abuse Brother     Social History   Socioeconomic History  . Marital status: Divorced    Spouse name: n/a  . Number of children: 0  . Years of education: Not on file  . Highest education level: Not on file  Occupational History  . Occupation: Visual merchandiser: [employer]  Tobacco Use  . Smoking status: Current Every Day Smoker    Packs/day: 2.00    Years: 40.00    Pack years: 80.00    Types: Cigarettes  . Smokeless tobacco: Never Used  Vaping Use  . Vaping Use: Former  Substance and Sexual Activity  . Alcohol use: Yes    Alcohol/week: 15.0 standard drinks    Types: 15 Standard drinks or equivalent per week    Comment: 0-20 drinks/week. Pint of Liquor day   . Drug use: Yes    Types: Marijuana  . Sexual activity: Yes    Birth control/protection: Condom  Other Topics  Concern  . Not on file  Social History Narrative   Lives with his girlfriend with his 2 cats. Mother died in 2013/03/08 in [place], [place] in her home with Hospice.  Daughter lives in [place], [place].   Social Determinants of Health   Financial Resource Strain: Not on file  Food Insecurity: Not on file  Transportation Needs: Not on file  Physical Activity: Not on file  Stress: Not on file  Social Connections: Not on file  Intimate Partner Violence: Not on file    Outpatient Medications Prior to Visit  Medication Sig Dispense Refill  . furosemide (LASIX) 20 MG tablet Take 1 tablet (20 mg total) by mouth daily. 30 tablet 3  . gabapentin (NEURONTIN) 300 MG capsule Take 1 capsule (300 mg total) by mouth 3 (three) times daily. 90 capsule 1  . hydrochlorothiazide (HYDRODIURIL) 25 MG tablet Take 1 tablet (25 mg total) by mouth daily. 90 tablet 3  . losartan  (COZAAR) 50 MG tablet Take 1 tablet (50 mg total) by mouth daily. 90 tablet 3  . naproxen (NAPROSYN) 500 MG tablet Take 1 tablet (500 mg total) by mouth 2 (two) times daily. 30 tablet 0  . albuterol (VENTOLIN HFA) 108 (90 Base) MCG/ACT inhaler Inhale 2 puffs into the lungs every 6 (six) hours as needed for wheezing or shortness of breath. 8 g 2  . b complex vitamins tablet Take 1 tablet by mouth 3 (three) times a week.  (Patient not taking: No sig reported)    . budesonide-formoterol (SYMBICORT) 160-4.5 MCG/ACT inhaler Inhale 2 puffs into the lungs in the morning and at bedtime. (Patient not taking: No sig reported) 1 each 12  . buPROPion (WELLBUTRIN SR) 150 MG 12 hr tablet Take 1 tablet (150 mg total) by mouth 2 (two) times daily. (Patient not taking: No sig reported) 60 tablet 0  . Coenzyme Q10 (CO Q 10 PO) Take 1 tablet by mouth daily. (Patient not taking: No sig reported)    . cyclobenzaprine (FLEXERIL) 10 MG tablet Take 1 tablet (10 mg total) by mouth 3 (three) times daily as needed for muscle spasms. (Patient not taking: No sig reported) 90 tablet 1  . doxycycline (VIBRA-TABS) 100 MG tablet Take 1 tablet (100 mg total) by mouth 2 (two) times daily. (Patient not taking: Reported on 01/26/2021) 20 tablet 0  . TURMERIC PO Take 1 tablet by mouth 2 (two) times daily. (Patient not taking: No sig reported)    . YUCCA PO Take 2 capsules by mouth 2 (two) times daily. (Patient not taking: No sig reported)     No facility-administered medications prior to visit.    Allergies  Allergen Reactions  . Penicillins Other (See Comments)    Childhood Allergy Has patient had a PCN reaction causing immediate rash, facial/tongue/throat swelling, SOB or lightheadedness with hypotension: Unknown Has patient had a PCN reaction causing severe rash involving mucus membranes or skin necrosis: Unknown Has patient had a PCN reaction that required hospitalization: Unknown Has patient had a PCN reaction occurring within  the last 10 years: Unknown If all of the above answers are "NO", then may proceed with Cephalosporin use.     ROS Review of Systems  Constitutional: Negative.   HENT: Negative.   Eyes: Negative.   Respiratory: Negative.   Cardiovascular: Negative.   Gastrointestinal: Negative.   Genitourinary: Negative.   Musculoskeletal: Negative.   Skin: Negative.   Neurological: Negative.   Psychiatric/Behavioral: Negative.   All other systems reviewed and are negative.  Objective:    Physical Exam Constitutional:      General: He is not in acute distress.    Appearance: Normal appearance. He is normal weight. He is not ill-appearing, toxic-appearing or diaphoretic.  Cardiovascular:     Rate and Rhythm: Normal rate and regular rhythm.     Heart sounds: Normal heart sounds. No murmur heard. No friction rub. No gallop.      Comments: Distant heart sounds Pulmonary:     Effort: Pulmonary effort is normal. No respiratory distress.     Breath sounds: No stridor. Wheezing (both bases.) present. No rhonchi or rales.     Comments: No atelectasis Chest:     Chest wall: No tenderness.  Abdominal:     General: Abdomen is flat. Bowel sounds are normal.     Palpations: Abdomen is soft.     Tenderness: There is no abdominal tenderness.  Skin:    General: Skin is warm and dry.  Neurological:     General: No focal deficit present.     Mental Status: He is alert and oriented to person, place, and time. Mental status is at baseline.  Psychiatric:        Mood and Affect: Mood normal.        Behavior: Behavior normal.        Thought Content: Thought content normal.        Judgment: Judgment normal.     BP 138/84   Pulse 77   Temp 98.1 F (36.7 C) (Temporal)   Resp 18   Ht 6' (1.829 m)   Wt (!) 326 lb (147.9 kg)   SpO2 92%   BMI 44.21 kg/m  Wt Readings from Last 3 Encounters:  01/26/21 (!) 326 lb (147.9 kg)  06/19/20 (!) 313 lb (142 kg)  06/03/20 280 lb (127 kg)     There are  no preventive care reminders to display for this patient.  There are no preventive care reminders to display for this patient.  Lab Results  Component Value Date   TSH 1.710 06/28/2017   Lab Results  Component Value Date   WBC 6.2 06/19/2020   HGB 16.9 06/19/2020   HCT 49.6 06/19/2020   MCV 105.2 (H) 06/19/2020   PLT 161.0 06/19/2020   Lab Results  Component Value Date   NA 139 06/19/2020   K 4.0 06/19/2020   CO2 28 06/19/2020   GLUCOSE 95 06/19/2020   BUN 9 06/19/2020   CREATININE 0.77 06/19/2020   BILITOT 1.2 06/19/2020   ALKPHOS 59 06/19/2020   AST 34 06/19/2020   ALT 23 06/19/2020   PROT 6.6 06/19/2020   ALBUMIN 4.1 06/19/2020   CALCIUM 9.3 06/19/2020   ANIONGAP 11 07/15/2018   GFR 101.36 06/19/2020   Lab Results  Component Value Date   CHOL 186 06/19/2020   Lab Results  Component Value Date   HDL 49.70 06/19/2020   Lab Results  Component Value Date   LDLCALC 117 (H) 06/19/2020   Lab Results  Component Value Date   TRIG 93.0 06/19/2020   Lab Results  Component Value Date   CHOLHDL 4 06/19/2020   Lab Results  Component Value Date   HGBA1C 5.2 06/19/2020      Assessment & Plan:   Problem List Items Addressed This Visit      Respiratory   COPD exacerbation ([place])   Relevant Medications   beclomethasone (QVAR REDIHALER) 80 MCG/ACT inhaler   Other Relevant Orders   DG Chest 2 View  CT CHEST LUNG CA SCREEN LOW DOSE W/O CM     Other   Nicotine addiction   Relevant Medications   buPROPion (WELLBUTRIN SR) 150 MG 12 hr tablet   Alcohol abuse, daily use   Relevant Orders   US Abdomen Limited RUQ (LIVER/GB)    Other Visit Diagnoses    Screening for endocrine, metabolic and immunity disorder    -  Primary   Relevant Orders   CBC with Differential/Platelet   TSH   Lipid screening       Relevant Orders   Lipid panel   DOE (dyspnea on exertion)       Relevant Orders   Brain natriuretic peptide   CBC with Differential/Platelet   EKG  12-Lead (Completed)   DG Chest 2 View   CT CHEST LUNG CA SCREEN LOW DOSE W/O CM   Weight gain       Relevant Orders   CBC with Differential/Platelet   EKG 12-Lead (Completed)   Weight gain with edema       Relevant Orders   Brain natriuretic peptide   CBC with Differential/Platelet   D-Dimer, Quantitative   DG Chest 2 View   US Abdomen Limited RUQ (LIVER/GB)   Uncomplicated alcohol dependence (HCC)       Relevant Orders   CBC with Differential/Platelet   Comprehensive metabolic panel   Ammonia   B12   DG Chest 2 View   US Abdomen Limited RUQ (LIVER/GB)   Vitamin deficiency       Relevant Orders   Vitamin D (25 hydroxy)      No orders of the defined types were placed in this encounter.   Follow-up: No follow-ups on file.   PLAN  Labs collected. Will follow up with the patient as warranted.  EKG done today, compared to 07/15/2018. Still  Borderline PR prolongation. This ekg seems more suggestive of incomplete RBBB than L. However, still no evidence of STEMI or ischemic changes, RRR.   CXR to rule out lingering lung infection or COPD exacerbation, also to check for cardiomegaly given distant heart sounds.   CT Chest low dose lung ca screen ordered based on uspstf guidelines.  US liver to check fatty liver, check for signs of portal hypertension  Refer to Cardiology. Given DOE may benefit from stress test and/or echo.   Start wellbutrin 150mg  SR po qd, increase to bid after one week if tolerated. Encourage smoking cessation  Increase lasix to 40mg  PO qd  Start QVAR 2 puffs twice daily for COPD. This is to take place of symbicort, which pt unfortunately could not afford.   Return in 4 weeks for check on chronic conditions, meds, and other  Patient encouraged to call clinic with any questions, comments, or concerns.  Maximiano Coss, NP

## 2021-01-26 NOTE — Patient Instructions (Addendum)
Mr. Elijah Barton -   Doristine Devoid to meet you today. I know we talked about far more than just this at today's visit, but in brief, some pertinent information:  Med changes:  Double Lasix to 40mg  by mouth daily. This will help reduce the fluid you're holding on to.   Increase gabapentin to 300-300-600mg  dosing through the day. After one week, you can go to 600-300-600mg . After another week, you can go to 600-600-600mg  if needed. Increases in dosing can cause some mild sedation. If this happens, reduce the dose. This should help with pain.  Start Wellbutrin (Bupropion) 150mg  ER by mouth daily, then after one week increase to twice daily. We can check on this in about 1 mo. This should help with mental health and with smoking cessation.  Start QVar inhaler two puffs twice daily. This will take place of the symbicort.  Other orders from today:  Referrals: Cardiology. Given the mild abnormality on the EKG and the distant heart sounds, I think it's worth taking a more in depth look at your cardiac function. This office will call you to get an appointment scheduled.  Imaging: Chest X Ray - this ideally will happen today if possible. I want to make sure there's nothing concerning happening in your lungs, and I want to see the size of your heart to make sure it's not enlarged. I have put the order in for [facility], which is right down the road. You can walk in to have this done, I will make sure we call to let them know to expect you. Chest CT scan: this is higher resolution imaging to make sure there's no evidence of lung cancer or changes of concern given your smoking history. This is recommended by the [place] Preventative Services Task Force and will be covered by insurance. Lastly, Liver Ultrasound: This will be done to make sure there are no cysts, fibrosis, cirrhosis, fatty infiltrates, or other changes to the liver of concern. Additionally, this will tell us if there are any signs of "portal  hypertension", which is high blood pressure within the circulation of the liver, which can be problematic.   Lab tests: Blood counts, metabolic panel for liver and kidney function as well as electrolytes, D-dimer to rule out clot, BNP to check for damage to heart tissue, TSH to check thyroid function, vitamin B12 and D to check for deficiencies as these may contribute to pain, ammonia levels to check for liver dysfunction, and lipid panel to check cholesterol. These results should be back by tomorrow. I will be in touch to let you know if I have concerns, otherwise I'll put notes on MyChart to let you know how they're looking.   I'd like to follow up in about 4 weeks to check on how things are going. Please return sooner with any concerns.

## 2021-01-27 ENCOUNTER — Other Ambulatory Visit: Payer: Self-pay | Admitting: Registered Nurse

## 2021-01-27 ENCOUNTER — Encounter: Payer: Self-pay | Admitting: Registered Nurse

## 2021-01-27 DIAGNOSIS — E559 Vitamin D deficiency, unspecified: Secondary | ICD-10-CM

## 2021-01-27 LAB — D-DIMER, QUANTITATIVE: D-Dimer, Quant: 0.53 ug{FEU}/mL — ABNORMAL HIGH

## 2021-01-27 MED ORDER — VITAMIN D (ERGOCALCIFEROL) 1.25 MG (50000 UNIT) PO CAPS: 50000.0000 [IU] | ORAL_CAPSULE | ORAL | 0 refills | Status: DC

## 2021-01-28 ENCOUNTER — Ambulatory Visit: Payer: Medicare Other

## 2021-02-09 ENCOUNTER — Inpatient Hospital Stay: Admission: RE | Admit: 2021-02-09 | Payer: Medicare Other | Source: Ambulatory Visit

## 2021-02-10 ENCOUNTER — Other Ambulatory Visit: Payer: Self-pay

## 2021-02-10 ENCOUNTER — Encounter: Payer: Self-pay | Admitting: Registered Nurse

## 2021-02-10 ENCOUNTER — Telehealth (INDEPENDENT_AMBULATORY_CARE_PROVIDER_SITE_OTHER): Payer: Medicare Other | Admitting: Registered Nurse

## 2021-02-10 VITALS — BP 149/92 | HR 72 | Temp 96.8°F | Wt 345.0 lb

## 2021-02-10 DIAGNOSIS — J441 Chronic obstructive pulmonary disease with (acute) exacerbation: Secondary | ICD-10-CM | POA: Diagnosis not present

## 2021-02-10 DIAGNOSIS — R0981 Nasal congestion: Secondary | ICD-10-CM

## 2021-02-10 DIAGNOSIS — R609 Edema, unspecified: Secondary | ICD-10-CM | POA: Diagnosis not present

## 2021-02-10 DIAGNOSIS — R635 Abnormal weight gain: Secondary | ICD-10-CM | POA: Diagnosis not present

## 2021-02-10 MED ORDER — AZELASTINE HCL 0.1 % NA SOLN: 1.0000 | Freq: Two times a day (BID) | NASAL | 12 refills | Status: DC

## 2021-02-10 MED ORDER — FUROSEMIDE 20 MG PO TABS
20.0000 mg | ORAL_TABLET | Freq: Every day | ORAL | 3 refills | Status: DC
Start: 2021-02-10 — End: 2021-02-23

## 2021-02-10 MED ORDER — DOXYCYCLINE HYCLATE 100 MG PO TABS: 100.0000 mg | ORAL_TABLET | Freq: Two times a day (BID) | ORAL | 0 refills | Status: DC

## 2021-02-10 NOTE — Progress Notes (Signed)
Telemedicine Encounter- SOAP NOTE Established Patient  This telephone encounter was conducted with the patient's (or proxy's) verbal consent via audio telecommunications: yes  Patient was instructed to have this encounter in a suitably private space; and to only have persons present to whom they give permission to participate. In addition, patient identity was confirmed by use of name plus two identifiers (DOB and address).  I discussed the limitations, risks, security and privacy concerns of performing an evaluation and management service by telephone and the availability of in person appointments. I also discussed with the patient that there may be a patient responsible charge related to this service. The patient expressed understanding and agreed to proceed.  I spent a total of 15 minutes talking with the patient or their proxy.  Patient at home Provider in office  Participants: Kathrin Ruddy, NP, Mr. Jaquarious Grey, and Mrs. Eulas Post.   Chief Complaint  Patient presents with  . Cough    Patient states he has been coughing up some green mucus , congested and thinks he has and upper respiratory infection for about one week.    Subjective   Elijah Barton is a 65 y.o. established patient. Telephone visit today for respiratory infection  HPI Lingering since virtual visit with Dr. Birdie Riddle on 12/29/20. Admits he lost prescription for doxycycline and did not finish around half of the course.  Coughing is worsening, green mucus and shob. States he has run out of furosemide as of Sunday as well. Notes weight gain of around 5 lbs in past 3-4 days. Thinks this is contributing to his breathing concerns. Also has sinus congestion and pressure. Some PND. Hx of allergies but these feel different  Has taken home vitals as in chart  Patient Active Problem List   Diagnosis Date Noted  . Chronic bronchitis ([place]) 07/22/2020  . Back spasm 11/27/2018  . Hypoxia 09/19/2017  . 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  Alcohol abuse, daily use 10/12/2014  . COPD exacerbation ([place]) 09/02/2013  . BMI 37.0-37.9, adult 09/02/2013  . Nicotine addiction 09/02/2013  . Peripheral neuropathy 03/12/2013    Past Medical History:  Diagnosis Date  . Alcohol abuse   . Allergies   . Arthritis   . Back pain   . Cataracts, bilateral   . COPD (chronic obstructive pulmonary disease) ([place])   . Fatty liver   . Glaucoma   . History of depression   . Joint pain    all over except for hip  . Peripheral neuropathy   . Substance abuse ([place])     Current Outpatient Medications  Medication Sig Dispense Refill  . albuterol (VENTOLIN HFA) 108 (90 Base) MCG/ACT inhaler Inhale 2 puffs into the lungs every 6 (six) hours as needed for wheezing or shortness of breath. 8 g 2  . azelastine (ASTELIN) 0.1 % nasal spray Place 1 spray into both nostrils 2 (two) times daily. Use in each nostril as directed 30 mL 12  . beclomethasone (QVAR REDIHALER) 80 MCG/ACT inhaler Inhale 2 puffs into the lungs 2 (two) times daily. 1 each 5  . buPROPion (WELLBUTRIN SR) 150 MG 12 hr tablet Take 1 tablet (150 mg total) by mouth 2 (two) times daily. 180 tablet 0  . gabapentin (NEURONTIN) 300 MG capsule Take 1 capsule (300 mg total) by mouth 3 (three) times daily. 90 capsule 1  . hydrochlorothiazide (HYDRODIURIL) 25 MG tablet Take 1 tablet (25 mg total) by mouth daily. 90 tablet 3  . losartan (COZAAR) 50 MG tablet Take  1 tablet (50 mg total) by mouth daily. 90 tablet 3  . naproxen (NAPROSYN) 500 MG tablet Take 1 tablet (500 mg total) by mouth 2 (two) times daily. 30 tablet 0  . Vitamin D, Ergocalciferol, (DRISDOL) 1.25 MG (50000 UNIT) CAPS capsule Take 1 capsule (50,000 Units total) by mouth every 7 (seven) days. 8 capsule 0  . b complex vitamins tablet Take 1 tablet by mouth 3 (three) times a week.  (Patient not taking: No sig reported)    . budesonide-formoterol (SYMBICORT) 160-4.5 MCG/ACT inhaler Inhale 2 puffs into the lungs in the morning and at  bedtime. (Patient not taking: No sig reported) 1 each 12  . Coenzyme Q10 (CO Q 10 PO) Take 1 tablet by mouth daily. (Patient not taking: No sig reported)    . cyclobenzaprine (FLEXERIL) 10 MG tablet Take 1 tablet (10 mg total) by mouth 3 (three) times daily as needed for muscle spasms. (Patient not taking: No sig reported) 90 tablet 1  . doxycycline (VIBRA-TABS) 100 MG tablet Take 1 tablet (100 mg total) by mouth 2 (two) times daily. 20 tablet 0  . furosemide (LASIX) 20 MG tablet Take 1 tablet (20 mg total) by mouth daily. 30 tablet 3  . TURMERIC PO Take 1 tablet by mouth 2 (two) times daily. (Patient not taking: No sig reported)    . YUCCA PO Take 2 capsules by mouth 2 (two) times daily. (Patient not taking: No sig reported)     No current facility-administered medications for this visit.    Allergies  Allergen Reactions  . Penicillins Other (See Comments)    Childhood Allergy Has patient had a PCN reaction causing immediate rash, facial/tongue/throat swelling, SOB or lightheadedness with hypotension: Unknown Has patient had a PCN reaction causing severe rash involving mucus membranes or skin necrosis: Unknown Has patient had a PCN reaction that required hospitalization: Unknown Has patient had a PCN reaction occurring within the last 10 years: Unknown If all of the above answers are "NO", then may proceed with Cephalosporin use.     Social History   Socioeconomic History  . Marital status: Divorced    Spouse name: n/a  . Number of children: 0  . Years of education: Not on file  . Highest education level: Not on file  Occupational History  . Occupation: Visual merchandiser: [employer]  Tobacco Use  . Smoking status: Current Every Day Smoker    Packs/day: 2.00    Years: 40.00    Pack years: 80.00    Types: Cigarettes  . Smokeless tobacco: Never Used  Vaping Use  . Vaping Use: Former  Substance and Sexual Activity  . Alcohol use: Yes    Alcohol/week:  15.0 standard drinks    Types: 15 Standard drinks or equivalent per week    Comment: 0-20 drinks/week. Pint of Liquor day   . Drug use: Yes    Types: Marijuana  . Sexual activity: Yes    Birth control/protection: Condom  Other Topics Concern  . Not on file  Social History Narrative   Lives with his girlfriend with his 2 cats. Mother died in 02/22/2013 in [place], [place] in her home with Hospice.  Daughter lives in [place], [place].   Social Determinants of Health   Financial Resource Strain: Not on file  Food Insecurity: Not on file  Transportation Needs: Not on file  Physical Activity: Not on file  Stress: Not on file  Social Connections: Not on file  Intimate Partner Violence: Not on file    ROS Per hpi   Objective   Vitals as reported by the patient: There were no vitals filed for this visit.  Elijah Barton was seen today for cough.  Diagnoses and all orders for this visit:  Nasal congestion -     doxycycline (VIBRA-TABS) 100 MG tablet; Take 1 tablet (100 mg total) by mouth 2 (two) times daily. -     azelastine (ASTELIN) 0.1 % nasal spray; Place 1 spray into both nostrils 2 (two) times daily. Use in each nostril as directed  COPD exacerbation ([facility]) -     doxycycline (VIBRA-TABS) 100 MG tablet; Take 1 tablet (100 mg total) by mouth 2 (two) times daily. -     azelastine (ASTELIN) 0.1 % nasal spray; Place 1 spray into both nostrils 2 (two) times daily. Use in each nostril as directed  Weight gain with edema -     furosemide (LASIX) 20 MG tablet; Take 1 tablet (20 mg total) by mouth daily.  Peripheral edema -     furosemide (LASIX) 20 MG tablet; Take 1 tablet (20 mg total) by mouth daily.   PLAN  Refill lasix. Suspect weight gain resulting from edema. Perhaps some fluid in lungs contributing to respiratory symptoms.   Refill doxycycline. Reviewed importance of finishing course even if feeling better.   Azelastine for relief of sinus congestion  Call clinic of worsening or  failing to improve  Patient encouraged to call clinic with any questions, comments, or concerns.   I discussed the assessment and treatment plan with the patient. The patient was provided an opportunity to ask questions and all were answered. The patient agreed with the plan and demonstrated an understanding of the instructions.   The patient was advised to call back or seek an in-person evaluation if the symptoms worsen or if the condition fails to improve as anticipated.  I provided 15 minutes of non-face-to-face time during this encounter.  Maximiano Coss, NP  Primary Care at [facility]

## 2021-02-10 NOTE — Patient Instructions (Signed)
° ° ° °  If you have lab work done today you will be contacted with your lab results within the next 2 weeks.  If you have not heard from us then please contact us. The fastest way to get your results is to register for My Chart. ° ° °IF you received an x-ray today, you will receive an invoice from [facility]. Please contact [facility] at [phone] with questions or concerns regarding your invoice.  ° °IF you received labwork today, you will receive an invoice from [facility]. Please contact [facility] at [phone] with questions or concerns regarding your invoice.  ° °Our billing staff will not be able to assist you with questions regarding bills from these companies. ° °You will be contacted with the lab results as soon as they are available. The fastest way to get your results is to activate your My Chart account. Instructions are located on the last page of this paperwork. If you have not heard from us regarding the results in 2 weeks, please contact this office. °  ° ° ° °

## 2021-02-11 ENCOUNTER — Ambulatory Visit
Admission: RE | Admit: 2021-02-11 | Discharge: 2021-02-11 | Disposition: A | Discharge status: Home / Self Care | Payer: Medicare Other | Source: Ambulatory Visit | Attending: Registered Nurse | Admitting: Registered Nurse

## 2021-02-11 DIAGNOSIS — R609 Edema, unspecified: Secondary | ICD-10-CM

## 2021-02-11 DIAGNOSIS — F1020 Alcohol dependence, uncomplicated: Secondary | ICD-10-CM

## 2021-02-11 DIAGNOSIS — F1010 Alcohol abuse, uncomplicated: Secondary | ICD-10-CM

## 2021-02-11 DIAGNOSIS — R635 Abnormal weight gain: Secondary | ICD-10-CM

## 2021-02-11 DIAGNOSIS — K760 Fatty (change of) liver, not elsewhere classified: Secondary | ICD-10-CM | POA: Diagnosis not present

## 2021-02-19 ENCOUNTER — Other Ambulatory Visit: Payer: Self-pay

## 2021-02-19 ENCOUNTER — Ambulatory Visit (INDEPENDENT_AMBULATORY_CARE_PROVIDER_SITE_OTHER)
Admission: RE | Admit: 2021-02-19 | Discharge: 2021-02-19 | Disposition: A | Discharge status: Home / Self Care | Payer: Medicare Other | Source: Ambulatory Visit | Attending: Registered Nurse | Admitting: Registered Nurse

## 2021-02-19 DIAGNOSIS — Z87891 Personal history of nicotine dependence: Secondary | ICD-10-CM | POA: Diagnosis not present

## 2021-02-19 DIAGNOSIS — R0600 Dyspnea, unspecified: Secondary | ICD-10-CM

## 2021-02-19 DIAGNOSIS — J441 Chronic obstructive pulmonary disease with (acute) exacerbation: Secondary | ICD-10-CM

## 2021-02-19 DIAGNOSIS — R0609 Other forms of dyspnea: Secondary | ICD-10-CM

## 2021-02-23 ENCOUNTER — Other Ambulatory Visit: Payer: Self-pay

## 2021-02-23 ENCOUNTER — Encounter: Payer: Self-pay | Admitting: Registered Nurse

## 2021-02-23 ENCOUNTER — Ambulatory Visit (INDEPENDENT_AMBULATORY_CARE_PROVIDER_SITE_OTHER): Payer: Medicare Other | Admitting: Registered Nurse

## 2021-02-23 ENCOUNTER — Telehealth: Payer: Self-pay | Admitting: Registered Nurse

## 2021-02-23 VITALS — BP 138/75 | HR 80 | Temp 97.9°F | Resp 18 | Ht 72.0 in | Wt 324.6 lb

## 2021-02-23 DIAGNOSIS — R609 Edema, unspecified: Secondary | ICD-10-CM

## 2021-02-23 DIAGNOSIS — R635 Abnormal weight gain: Secondary | ICD-10-CM | POA: Diagnosis not present

## 2021-02-23 DIAGNOSIS — R0981 Nasal congestion: Secondary | ICD-10-CM | POA: Diagnosis not present

## 2021-02-23 MED ORDER — FLUTICASONE PROPIONATE 50 MCG/ACT NA SUSP: 2.0000 | Freq: Every day | NASAL | 6 refills | Status: DC

## 2021-02-23 MED ORDER — FUROSEMIDE 20 MG PO TABS: 40.0000 mg | ORAL_TABLET | Freq: Every day | ORAL | 3 refills | Status: DC

## 2021-02-23 NOTE — Patient Instructions (Addendum)
Mr. Ripple -   Good to see you today. Glad you're feeling better.  I've increased the furosemide.Go ahead and take two a day of the ones you have left of the lower dose.   While your liver function testing was encouraging, and that's no doubt good news, there are some further concerns - namely portal hypertension. This is higher pressure within the circulatory system of your liver. This can lead to concerns with cardiac function and to esophageal varices- which are not great. I would still encourage you to continue to reduce alcohol consumption.  I think AA is great for what you said - it helps to modulate your time. There are no major secrets, the mantra "Change the things I can, accept the things I can't and the wisdom to know the difference" is good advice, but it's not always that simple. However, knowing you're not alone in your pursuit can be remarkably helpful.   In addition, I definitely recommend you continue to pursue reading. It's a great hobby, of course, but even more important with age. Keeping an active mind can help delay any onset of dementia or cognitive decline. Looking into the [employer] or [employer] system can provide a ton of free ebooks, meaning there's no need to collect 2,000 again (Still in awe of that number, by the way). I know you had said sci-fi was your go to, so I'm guessing my recommendation of Geralynn Ochs is one you've read quite some time ago, but a few others: This is How You Lose the Time War (Amal El-Mohtar, Max [place]), House of Leaves ([address] Z Danielewski), Warren Beatty), Midnight's Children ([place]), books by Janett Labella, books by Cornell Barman, Shogun Lossie Faes)..  I'm sure you've already read at least some of these, and some are real doozies, but worth checking out.  If you're looking for a break from online gaming and something more story driven and interactive, Horizon: [employer] is a pretty neat open world sci-fi  game.  The sequel just came out this year - if you're a completionist, expect around 180-200 hours of time.  I also think "forcing it" can be helpful as well - I know you said that you don't go out much, sometimes you have to take a more strongheaded approach to it and just go out and do something. It's tough, but it helps break up the routine.  A big piece of changing habits is keeping the momentum going. Many folks will have a bad day or two and give in. Just remember, even if you're taking two steps forward and one step backwards, you're still making progress.  Let me know if there's anything I can do for you  Rich    If you have lab work done today you will be contacted with your lab results within the next 2 weeks.  If you have not heard from [place] then please contact us. The fastest way to get your results is to register for My Chart.   IF you received an x-ray today, you will receive an invoice from [facility]. Please contact [facility] at [phone] with questions or concerns regarding your invoice.   IF you received labwork today, you will receive an invoice from [facility]. Please contact [facility] at [phone] with questions or concerns regarding your invoice.   Our billing staff will not be able to assist you with questions regarding bills from these companies.  You will be contacted with the lab  results as soon as they are available. The fastest way to get your results is to activate your My Chart account. Instructions are located on the last page of this paperwork. If you have not heard from [place] regarding the results in 2 weeks, please contact this office.

## 2021-02-23 NOTE — Progress Notes (Signed)
Established Patient Office Visit  Subjective:  Patient ID: Elijah Barton, male    DOB: Jul 01, 1956  Age: 65 y.o. MRN: [id number]  CC:  Chief Complaint  Patient presents with  . Follow-up    Patient state he is here for follow up on URI. Per patient the antibiotic has helped, but would like to discuss something with only the Provider.    HPI Gordon Vandunk presents for follow up   tx for lower respiratory infection around 1 mo ago Started on lasix as well for some fluid retention  EKG showed rsr' v1 nondiagnostic, I suspect this to be related to some excess fluid and pulmonary illness ongoing at that time but cannot rule out incomplete rbbb or perhaps some decreased cardiac function or start of CHF. Feeling much better. Notes he is down around 20lbs through watching his diet, limiting EtOH intake, and consistently taking lasix. No ongoing symptoms of concern  Notes that he would like to discuss alcohol use today. He has been a heavy drinking for a number of decades, starting with his time in the [employer]. He notes as well that he has used a number of recreational substances, stating "everything except the needle". Acknowledges now he usually drinks about 1 pt of alcohol daily. Acknowledges this is too much but content that it is less than he had done previously. LFTs normal. No evidence of esophageal varices - no hemoptysis or GERD  Interested in discussing improving lifestyle and taking a more active interest in his health.   Past Medical History:  Diagnosis Date  . Alcohol abuse   . Allergies   . Arthritis   . Back pain   . Cataracts, bilateral   . COPD (chronic obstructive pulmonary disease) ([place])   . Fatty liver   . Glaucoma   . History of depression   . Joint pain    all over except for hip  . Peripheral neuropathy   . Substance abuse [facility])     Past Surgical History:  Procedure Laterality Date  . LYMPH NODE BIOPSY  1970   neck  . TONSILLECTOMY AND ADENOIDECTOMY  1968     Family History  Problem Relation Age of Onset  . Colon cancer Mother   . Heart disease Mother   . Stroke Mother   . Cancer Mother        lung  . Alcohol abuse Mother   . Arthritis Mother   . Asthma Mother   . COPD Mother   . Depression Mother   . Cirrhosis Father   . Alcohol abuse Father   . Alcohol abuse Brother     Social History   Socioeconomic History  . Marital status: Divorced    Spouse name: n/a  . Number of children: 0  . Years of education: Not on file  . Highest education level: Not on file  Occupational History  . Occupation: Visual merchandiser: [employer]  Tobacco Use  . Smoking status: Current Every Day Smoker    Packs/day: 2.00    Years: 40.00    Pack years: 80.00    Types: Cigarettes  . Smokeless tobacco: Never Used  Vaping Use  . Vaping Use: Former  Substance and Sexual Activity  . Alcohol use: Yes    Alcohol/week: 15.0 standard drinks    Types: 15 Standard drinks or equivalent per week    Comment: 0-20 drinks/week. Pint of Liquor day   . Drug use: Yes  Types: Marijuana  . Sexual activity: Yes    Birth control/protection: Condom  Other Topics Concern  . Not on file  Social History Narrative   Lives with his girlfriend with his 2 cats. Mother died in 03/13/13 in [place], [place] in her home with Hospice.  Daughter lives in [place], [place].   Social Determinants of Health   Financial Resource Strain: Not on file  Food Insecurity: Not on file  Transportation Needs: Not on file  Physical Activity: Not on file  Stress: Not on file  Social Connections: Not on file  Intimate Partner Violence: Not on file    Outpatient Medications Prior to Visit  Medication Sig Dispense Refill  . albuterol (VENTOLIN HFA) 108 (90 Base) MCG/ACT inhaler Inhale 2 puffs into the lungs every 6 (six) hours as needed for wheezing or shortness of breath. 8 g 2  . azelastine (ASTELIN) 0.1 % nasal spray Place 1 spray into both nostrils 2 (two)  times daily. Use in each nostril as directed 30 mL 12  . beclomethasone (QVAR REDIHALER) 80 MCG/ACT inhaler Inhale 2 puffs into the lungs 2 (two) times daily. 1 each 5  . gabapentin (NEURONTIN) 300 MG capsule Take 1 capsule (300 mg total) by mouth 3 (three) times daily. 90 capsule 1  . hydrochlorothiazide (HYDRODIURIL) 25 MG tablet Take 1 tablet (25 mg total) by mouth daily. 90 tablet 3  . losartan (COZAAR) 50 MG tablet Take 1 tablet (50 mg total) by mouth daily. 90 tablet 3  . naproxen (NAPROSYN) 500 MG tablet Take 1 tablet (500 mg total) by mouth 2 (two) times daily. 30 tablet 0  . furosemide (LASIX) 20 MG tablet Take 1 tablet (20 mg total) by mouth daily. 30 tablet 3  . b complex vitamins tablet Take 1 tablet by mouth 3 (three) times a week.  (Patient not taking: No sig reported)    . budesonide-formoterol (SYMBICORT) 160-4.5 MCG/ACT inhaler Inhale 2 puffs into the lungs in the morning and at bedtime. (Patient not taking: No sig reported) 1 each 12  . buPROPion (WELLBUTRIN SR) 150 MG 12 hr tablet Take 1 tablet (150 mg total) by mouth 2 (two) times daily. (Patient not taking: Reported on 02/23/2021) 180 tablet 0  . Coenzyme Q10 (CO Q 10 PO) Take 1 tablet by mouth daily. (Patient not taking: No sig reported)    . cyclobenzaprine (FLEXERIL) 10 MG tablet Take 1 tablet (10 mg total) by mouth 3 (three) times daily as needed for muscle spasms. (Patient not taking: No sig reported) 90 tablet 1  . doxycycline (VIBRA-TABS) 100 MG tablet Take 1 tablet (100 mg total) by mouth 2 (two) times daily. (Patient not taking: Reported on 02/23/2021) 20 tablet 0  . TURMERIC PO Take 1 tablet by mouth 2 (two) times daily. (Patient not taking: No sig reported)    . Vitamin D, Ergocalciferol, (DRISDOL) 1.25 MG (50000 UNIT) CAPS capsule Take 1 capsule (50,000 Units total) by mouth every 7 (seven) days. (Patient not taking: Reported on 02/23/2021) 8 capsule 0  . YUCCA PO Take 2 capsules by mouth 2 (two) times daily. (Patient not  taking: No sig reported)     No facility-administered medications prior to visit.    Allergies  Allergen Reactions  . Penicillins Other (See Comments)    Childhood Allergy Has patient had a PCN reaction causing immediate rash, facial/tongue/throat swelling, SOB or lightheadedness with hypotension: Unknown Has patient had a PCN reaction causing severe rash involving mucus membranes or skin necrosis: Unknown  Has patient had a PCN reaction that required hospitalization: Unknown Has patient had a PCN reaction occurring within the last 10 years: Unknown If all of the above answers are "NO", then may proceed with Cephalosporin use.     ROS Review of Systems  Constitutional: Negative.   HENT: Negative.   Eyes: Negative.   Respiratory: Negative.   Cardiovascular: Negative.   Gastrointestinal: Negative.   Genitourinary: Negative.   Musculoskeletal: Negative.   Skin: Negative.   Neurological: Negative.   Psychiatric/Behavioral: Negative.   All other systems reviewed and are negative.     Objective:    Physical Exam Constitutional:      General: He is not in acute distress.    Appearance: Normal appearance. He is normal weight. He is not ill-appearing, toxic-appearing or diaphoretic.  Cardiovascular:     Rate and Rhythm: Normal rate and regular rhythm.     Heart sounds: Normal heart sounds. No murmur heard. No friction rub. No gallop.   Pulmonary:     Effort: Pulmonary effort is normal. No respiratory distress.     Breath sounds: Normal breath sounds. No stridor. No wheezing, rhonchi or rales.  Chest:     Chest wall: No tenderness.  Neurological:     General: No focal deficit present.     Mental Status: He is alert and oriented to person, place, and time. Mental status is at baseline.  Psychiatric:        Mood and Affect: Mood normal.        Behavior: Behavior normal.        Thought Content: Thought content normal.        Judgment: Judgment normal.     BP 138/75    Pulse 80   Temp 97.9 F (36.6 C) (Temporal)   Resp 18   Ht 6' (1.829 m)   Wt (!) 324 lb 9.6 oz (147.2 kg)   SpO2 99%   BMI 44.02 kg/m  Wt Readings from Last 3 Encounters:  02/23/21 (!) 324 lb 9.6 oz (147.2 kg)  02/10/21 (!) 345 lb (156.5 kg)  01/26/21 (!) 326 lb (147.9 kg)     There are no preventive care reminders to display for this patient.  There are no preventive care reminders to display for this patient.  Lab Results  Component Value Date   TSH 3.90 01/26/2021   Lab Results  Component Value Date   WBC 4.9 01/26/2021   HGB 16.4 01/26/2021   HCT 48.2 01/26/2021   MCV 101.7 (H) 01/26/2021   PLT 173.0 01/26/2021   Lab Results  Component Value Date   NA 139 01/26/2021   K 4.0 01/26/2021   CO2 33 (H) 01/26/2021   GLUCOSE 106 (H) 01/26/2021   BUN 18 01/26/2021   CREATININE 0.81 01/26/2021   BILITOT 0.7 01/26/2021   ALKPHOS 68 01/26/2021   AST 19 01/26/2021   ALT 20 01/26/2021   PROT 7.1 01/26/2021   ALBUMIN 3.9 01/26/2021   CALCIUM 9.5 01/26/2021   ANIONGAP 11 07/15/2018   GFR 92.40 01/26/2021   Lab Results  Component Value Date   CHOL 165 01/26/2021   Lab Results  Component Value Date   HDL 58.10 01/26/2021   Lab Results  Component Value Date   LDLCALC 89 01/26/2021   Lab Results  Component Value Date   TRIG 85.0 01/26/2021   Lab Results  Component Value Date   CHOLHDL 3 01/26/2021   Lab Results  Component Value Date   HGBA1C 5.2  06/19/2020      Assessment & Plan:   Problem List Items Addressed This Visit   None   Visit Diagnoses    Nasal congestion    -  Primary   Relevant Medications   fluticasone (FLONASE) 50 MCG/ACT nasal spray   Weight gain with edema       Relevant Medications   furosemide (LASIX) 20 MG tablet   Peripheral edema       Relevant Medications   furosemide (LASIX) 20 MG tablet      Meds ordered this encounter  Medications  . fluticasone (FLONASE) 50 MCG/ACT nasal spray    Sig: Place 2 sprays into both  nostrils daily.    Dispense:  16 g    Refill:  6    Order Specific Question:   Supervising Provider    Answer:   Carlota Raspberry, JEFFREY R [[id number]]  . furosemide (LASIX) 20 MG tablet    Sig: Take 2 tablets (40 mg total) by mouth daily.    Dispense:  30 tablet    Refill:  3    Order Specific Question:   Supervising Provider    Answer:   Carlota Raspberry, JEFFREY R [[id number]]    Follow-up: No follow-ups on file.   PLAN  Refill flonase  Increase furosemide  Return as scheduled  Discussed in depth lifestyle modifications to help with alcohol cessation. Pt declines medication and counseling at this time  Patient encouraged to call clinic with any questions, comments, or concerns.  I spent 44 minutes with this patient, more than 50% of which was spent counseling and/or educating.  Maximiano Coss, NP

## 2021-02-23 NOTE — Telephone Encounter (Signed)
Pharmacy states that the Lasix has been changed please send in a new script to the pharmacy.

## 2021-02-23 NOTE — Telephone Encounter (Signed)
Sent Thanks Rich

## 2021-03-04 ENCOUNTER — Encounter: Payer: Self-pay | Admitting: Pulmonary Disease

## 2021-03-04 ENCOUNTER — Other Ambulatory Visit: Payer: Self-pay

## 2021-03-04 ENCOUNTER — Ambulatory Visit: Payer: Medicare Other | Admitting: Pulmonary Disease

## 2021-03-04 VITALS — BP 148/68 | HR 76 | Temp 97.3°F | Ht 73.0 in | Wt 330.6 lb

## 2021-03-04 DIAGNOSIS — J432 Centrilobular emphysema: Secondary | ICD-10-CM | POA: Diagnosis not present

## 2021-03-04 DIAGNOSIS — Z720 Tobacco use: Secondary | ICD-10-CM

## 2021-03-04 NOTE — Patient Instructions (Addendum)
Use symbicort 2 puffs twice daily  Continue to use albuterol inhaler as needed  Recommend using 21mg  nicotine patch daily  You can use nicotine gum or nicotine lozenges as needed  It is ok to continue to smoke cigarettes while using nicotine replacement therapy. The risks of smoking while using nicotine replacement include nicotine toxicity.

## 2021-03-04 NOTE — Progress Notes (Signed)
Synopsis: Referred in 03/15/2021 for COPD exacerbation  Subjective:   PATIENT ID: Elijah Barton GENDER: male DOB: 1955/10/27, MRN: [id number]   HPI  Chief Complaint  Patient presents with  . Consult    Ref. By Dr. Alexander Bergeron exacerbation.Sob x 1 yr. With exertion, cough-clear. Finished doxycline 1 wk. Ago for URI   Elijah Barton is a 65 year old male, daily smoker with obesity, nocturnal hypoxia and emphysema who is referred to pulmonary clinic for COPD.   He reports being diagnosed with COPD in the past and has been treated for multiple exacerbations of his breathing. He has been prescribed symbicort 160-4.78mcg but is using it as needed. He rarely uses his albuterol inhaler.   He is smoking 3 packs per day. He has smoked since 1976. He has tried to quit in the past but has become very irritable. He is drinking a pint of vodka daily with intermittent use 751mL of liquor total in a day.   He had CT lung cancer screening on 02/19/21 which showed peribronchiolar thickening and 61mm nodules of the right lung. There are mild centrilobular changes throughout.   He had nocturnal oximetry in 2018 which he qualified for supplemental oxygen but he refused to use it due to the cost.   Past Medical History:  Diagnosis Date  . Alcohol abuse   . Allergies   . Arthritis   . Back pain   . Cataracts, bilateral   . COPD (chronic obstructive pulmonary disease) ([place])   . Fatty liver   . Glaucoma   . History of depression   . Hypertension   . Joint pain    all over except for hip  . Peripheral neuropathy   . Substance abuse ([place])      Family History  Problem Relation Age of Onset  . Colon cancer Mother   . Heart disease Mother   . Stroke Mother   . Cancer Mother        lung  . Alcohol abuse Mother   . Arthritis Mother   . Asthma Mother   . COPD Mother   . Depression Mother   . Cirrhosis Father   . Alcohol abuse Father   . Alcohol abuse Brother      Social History    Socioeconomic History  . Marital status: Divorced    Spouse name: n/a  . Number of children: 0  . Years of education: Not on file  . Highest education level: Not on file  Occupational History  . Occupation: Visual merchandiser: [employer]  Tobacco Use  . Smoking status: Current Every Day Smoker    Packs/day: 3.00    Years: 40.00    Pack years: 120.00    Types: Cigarettes  . Smokeless tobacco: Never Used  Vaping Use  . Vaping Use: Former  Substance and Sexual Activity  . Alcohol use: Yes    Alcohol/week: 15.0 standard drinks    Types: 15 Standard drinks or equivalent per week    Comment: 0-20 drinks/week. Pint of Liquor day   . Drug use: Yes    Types: Marijuana  . Sexual activity: Yes    Birth control/protection: Condom  Other Topics Concern  . Not on file  Social History Narrative   Lives with his girlfriend with his 2 cats. Mother died in 2013-03-15 in [place], [place] in her home with Hospice.  Daughter lives in [place], [place].   Social Determinants of Health  Financial Resource Strain: Not on file  Food Insecurity: Not on file  Transportation Needs: Not on file  Physical Activity: Not on file  Stress: Not on file  Social Connections: Not on file  Intimate Partner Violence: Not on file     Allergies  Allergen Reactions  . Penicillins Other (See Comments)    Childhood Allergy Has patient had a PCN reaction causing immediate rash, facial/tongue/throat swelling, SOB or lightheadedness with hypotension: Unknown Has patient had a PCN reaction causing severe rash involving mucus membranes or skin necrosis: Unknown Has patient had a PCN reaction that required hospitalization: Unknown Has patient had a PCN reaction occurring within the last 10 years: Unknown If all of the above answers are "NO", then may proceed with Cephalosporin use.      Outpatient Medications Prior to Visit  Medication Sig Dispense Refill  . albuterol (VENTOLIN HFA) 108 (90  Base) MCG/ACT inhaler Inhale 2 puffs into the lungs every 6 (six) hours as needed for wheezing or shortness of breath. 8 g 2  . b complex vitamins tablet Take 1 tablet by mouth 3 (three) times a week.    Marland Kitchen buPROPion (WELLBUTRIN SR) 150 MG 12 hr tablet Take 1 tablet (150 mg total) by mouth 2 (two) times daily. 180 tablet 0  . cholecalciferol (VITAMIN D3) 25 MCG (1000 UNIT) tablet Take 1,000 Units by mouth daily.    . cyclobenzaprine (FLEXERIL) 10 MG tablet Take 1 tablet (10 mg total) by mouth 3 (three) times daily as needed for muscle spasms. 90 tablet 1  . fluticasone (FLONASE) 50 MCG/ACT nasal spray Place 2 sprays into both nostrils daily. 16 g 6  . furosemide (LASIX) 20 MG tablet Take 2 tablets (40 mg total) by mouth daily. 30 tablet 3  . gabapentin (NEURONTIN) 300 MG capsule Take 1 capsule (300 mg total) by mouth 3 (three) times daily. 90 capsule 1  . hydrochlorothiazide (HYDRODIURIL) 25 MG tablet Take 1 tablet (25 mg total) by mouth daily. 90 tablet 3  . losartan (COZAAR) 50 MG tablet Take 1 tablet (50 mg total) by mouth daily. 90 tablet 3  . naproxen (NAPROSYN) 500 MG tablet Take 1 tablet (500 mg total) by mouth 2 (two) times daily. 30 tablet 0  . azelastine (ASTELIN) 0.1 % nasal spray Place 1 spray into both nostrils 2 (two) times daily. Use in each nostril as directed (Patient not taking: Reported on 03/04/2021) 30 mL 12  . beclomethasone (QVAR REDIHALER) 80 MCG/ACT inhaler Inhale 2 puffs into the lungs 2 (two) times daily. (Patient not taking: Reported on 03/04/2021) 1 each 5  . budesonide-formoterol (SYMBICORT) 160-4.5 MCG/ACT inhaler Inhale 2 puffs into the lungs in the morning and at bedtime. (Patient not taking: No sig reported) 1 each 12  . Coenzyme Q10 (CO Q 10 PO) Take 1 tablet by mouth daily. (Patient not taking: No sig reported)    . doxycycline (VIBRA-TABS) 100 MG tablet Take 1 tablet (100 mg total) by mouth 2 (two) times daily. (Patient not taking: No sig reported) 20 tablet 0  .  TURMERIC PO Take 1 tablet by mouth 2 (two) times daily. (Patient not taking: No sig reported)    . Vitamin D, Ergocalciferol, (DRISDOL) 1.25 MG (50000 UNIT) CAPS capsule Take 1 capsule (50,000 Units total) by mouth every 7 (seven) days. (Patient not taking: No sig reported) 8 capsule 0  . YUCCA PO Take 2 capsules by mouth 2 (two) times daily. (Patient not taking: No sig reported)  No facility-administered medications prior to visit.    Review of Systems  Constitutional: Negative for chills, fever, malaise/fatigue and weight loss.  HENT: Negative for congestion, sinus pain and sore throat.   Eyes: Negative.   Respiratory: Positive for cough, sputum production (clear sputum), shortness of breath and wheezing. Negative for hemoptysis.   Cardiovascular: Negative for chest pain, palpitations, orthopnea, claudication and leg swelling.  Gastrointestinal: Negative for abdominal pain, heartburn, nausea and vomiting.  Genitourinary: Negative.   Musculoskeletal: Negative for joint pain and myalgias.  Skin: Negative for rash.  Neurological: Negative for weakness.  Endo/Heme/Allergies: Negative.   Psychiatric/Behavioral: Positive for substance abuse.      Objective:   Vitals:   03/04/21 0909  BP: (!) 148/68  Pulse: 76  Temp: (!) 97.3 F (36.3 C)  TempSrc: Temporal  SpO2: 94%  Weight: (!) 330 lb 9.6 oz (150 kg)  Height: 6\' 1"  (1.854 m)     Physical Exam Constitutional:      General: He is not in acute distress.    Appearance: He is obese.  HENT:     Head: Normocephalic and atraumatic.  Eyes:     Extraocular Movements: Extraocular movements intact.     Conjunctiva/sclera: Conjunctivae normal.     Pupils: Pupils are equal, round, and reactive to light.  Cardiovascular:     Rate and Rhythm: Normal rate and regular rhythm.     Pulses: Normal pulses.     Heart sounds: Normal heart sounds. No murmur heard.   Pulmonary:     Effort: Pulmonary effort is normal.     Breath sounds:  Normal breath sounds. No wheezing, rhonchi or rales.  Abdominal:     General: Bowel sounds are normal.     Palpations: Abdomen is soft.  Musculoskeletal:     Right lower leg: Edema present.     Left lower leg: Edema present.  Lymphadenopathy:     Cervical: No cervical adenopathy.  Skin:    General: Skin is warm and dry.     Findings: Erythema (of lower extremities) present.  Neurological:     General: No focal deficit present.     Mental Status: He is alert.  Psychiatric:        Mood and Affect: Mood normal.        Behavior: Behavior normal.        Thought Content: Thought content normal.        Judgment: Judgment normal.    CBC    Component Value Date/Time   WBC 4.9 01/26/2021 1034   RBC 4.74 01/26/2021 1034   HGB 16.4 01/26/2021 1034   HCT 48.2 01/26/2021 1034   PLT 173.0 01/26/2021 1034   MCV 101.7 (H) 01/26/2021 1034   MCV 99.0 (A) 05/04/2018 1151   MCH 35.9 (H) 07/15/2018 1614   MCHC 34.0 01/26/2021 1034   RDW 12.8 01/26/2021 1034   LYMPHSABS 1.2 01/26/2021 1034   MONOABS 0.5 01/26/2021 1034   EOSABS 0.1 01/26/2021 1034   BASOSABS 0.0 01/26/2021 1034     Chest imaging: CT Chest Lung Cancer Screening 02/19/21 No pleural effusion, airspace consolidation, or atelectasis. Mild centrilobular emphysema with diffuse bronchial wall thickening. Two tiny lung nodules are noted in the right lung. The largest is in the upper lobe measuring 2.2 mm. No suspicious lung nodules identified at this time.  PFT: No flowsheet data found.    Assessment & Plan:   Centrilobular emphysema [facility])  Discussion: Elijah Barton is a 65 year old male, daily  smoker with obesity, nocturnal hypoxia and emphysema who is referred to pulmonary clinic for COPD.   He has centrilobular emphysema based on his recent CT chest scan.   He is to use his symbicort inhaler scheduled 2 puffs twice daily along with as needed albuterol.   On simple walk today in office, his SpO2 dipped down to 87%  with quick recovery into the 90s with rest. We will continue to monitor in the future but we will hold off on supplemental oxygen at this time.   He is high risk for sleep disordered breathing given his weight, underlying lung disease and alcohol dependence. We will consider sleep testing in the future.  We discussed smoking cessation modalities He is already on wellbutrin. He is to start nicotine patches 21mg  daily along with nicotine gum or lozenges for cravings. The goal it to have him cut down from 3 packs per day to at least 1 pack per day.   He will follow up in 1 month for follow up of smoking cessation.   Freda Jackson, MD [facility] Office: [phone]    Current Outpatient Medications:  .  albuterol (VENTOLIN HFA) 108 (90 Base) MCG/ACT inhaler, Inhale 2 puffs into the lungs every 6 (six) hours as needed for wheezing or shortness of breath., Disp: 8 g, Rfl: 2 .  b complex vitamins tablet, Take 1 tablet by mouth 3 (three) times a week., Disp: , Rfl:  .  buPROPion (WELLBUTRIN SR) 150 MG 12 hr tablet, Take 1 tablet (150 mg total) by mouth 2 (two) times daily., Disp: 180 tablet, Rfl: 0 .  cholecalciferol (VITAMIN D3) 25 MCG (1000 UNIT) tablet, Take 1,000 Units by mouth daily., Disp: , Rfl:  .  cyclobenzaprine (FLEXERIL) 10 MG tablet, Take 1 tablet (10 mg total) by mouth 3 (three) times daily as needed for muscle spasms., Disp: 90 tablet, Rfl: 1 .  fluticasone (FLONASE) 50 MCG/ACT nasal spray, Place 2 sprays into both nostrils daily., Disp: 16 g, Rfl: 6 .  furosemide (LASIX) 20 MG tablet, Take 2 tablets (40 mg total) by mouth daily., Disp: 30 tablet, Rfl: 3 .  gabapentin (NEURONTIN) 300 MG capsule, Take 1 capsule (300 mg total) by mouth 3 (three) times daily., Disp: 90 capsule, Rfl: 1 .  hydrochlorothiazide (HYDRODIURIL) 25 MG tablet, Take 1 tablet (25 mg total) by mouth daily., Disp: 90 tablet, Rfl: 3 .  losartan (COZAAR) 50 MG tablet, Take 1 tablet (50 mg  total) by mouth daily., Disp: 90 tablet, Rfl: 3 .  naproxen (NAPROSYN) 500 MG tablet, Take 1 tablet (500 mg total) by mouth 2 (two) times daily., Disp: 30 tablet, Rfl: 0 .  azelastine (ASTELIN) 0.1 % nasal spray, Place 1 spray into both nostrils 2 (two) times daily. Use in each nostril as directed (Patient not taking: Reported on 03/04/2021), Disp: 30 mL, Rfl: 12 .  beclomethasone (QVAR REDIHALER) 80 MCG/ACT inhaler, Inhale 2 puffs into the lungs 2 (two) times daily. (Patient not taking: Reported on 03/04/2021), Disp: 1 each, Rfl: 5 .  budesonide-formoterol (SYMBICORT) 160-4.5 MCG/ACT inhaler, Inhale 2 puffs into the lungs in the morning and at bedtime. (Patient not taking: No sig reported), Disp: 1 each, Rfl: 12 .  Coenzyme Q10 (CO Q 10 PO), Take 1 tablet by mouth daily. (Patient not taking: No sig reported), Disp: , Rfl:  .  doxycycline (VIBRA-TABS) 100 MG tablet, Take 1 tablet (100 mg total) by mouth 2 (two) times daily. (Patient not taking: No sig  reported), Disp: 20 tablet, Rfl: 0 .  TURMERIC PO, Take 1 tablet by mouth 2 (two) times daily. (Patient not taking: No sig reported), Disp: , Rfl:  .  Vitamin D, Ergocalciferol, (DRISDOL) 1.25 MG (50000 UNIT) CAPS capsule, Take 1 capsule (50,000 Units total) by mouth every 7 (seven) days. (Patient not taking: No sig reported), Disp: 8 capsule, Rfl: 0 .  YUCCA PO, Take 2 capsules by mouth 2 (two) times daily. (Patient not taking: No sig reported), Disp: , Rfl:

## 2021-04-12 ENCOUNTER — Ambulatory Visit: Payer: Medicare Other | Admitting: Pulmonary Disease

## 2021-04-26 ENCOUNTER — Other Ambulatory Visit: Payer: Self-pay

## 2021-04-26 DIAGNOSIS — M545 Low back pain, unspecified: Secondary | ICD-10-CM

## 2021-04-26 DIAGNOSIS — G8929 Other chronic pain: Secondary | ICD-10-CM

## 2021-04-26 MED ORDER — GABAPENTIN 300 MG PO CAPS: 300.0000 mg | ORAL_CAPSULE | Freq: Three times a day (TID) | ORAL | 1 refills | Status: DC

## 2021-04-26 NOTE — Telephone Encounter (Signed)
Patient is requesting a refill of the following medications: Requested Prescriptions   Pending Prescriptions Disp Refills   gabapentin (NEURONTIN) 300 MG capsule 90 capsule 1    Sig: Take 1 capsule (300 mg total) by mouth 3 (three) times daily.    Date of patient request: 04/23/2021 Last office visit: 02/23/2021 Date of last refill: 06/03/2020 Last refill amount: 90 capsules 1 refill Follow up time period per chart:N/a

## 2021-05-13 ENCOUNTER — Other Ambulatory Visit: Payer: Self-pay

## 2021-05-13 DIAGNOSIS — G8929 Other chronic pain: Secondary | ICD-10-CM

## 2021-05-13 DIAGNOSIS — M545 Low back pain, unspecified: Secondary | ICD-10-CM

## 2021-05-13 MED ORDER — NAPROXEN 500 MG PO TABS: 500.0000 mg | ORAL_TABLET | Freq: Two times a day (BID) | ORAL | 0 refills | Status: DC

## 2021-05-17 ENCOUNTER — Telehealth (INDEPENDENT_AMBULATORY_CARE_PROVIDER_SITE_OTHER): Payer: Medicare Other | Admitting: Registered Nurse

## 2021-05-17 ENCOUNTER — Other Ambulatory Visit: Payer: Self-pay

## 2021-05-17 ENCOUNTER — Encounter: Payer: Self-pay | Admitting: Registered Nurse

## 2021-05-17 DIAGNOSIS — M25569 Pain in unspecified knee: Secondary | ICD-10-CM | POA: Diagnosis not present

## 2021-05-17 DIAGNOSIS — J22 Unspecified acute lower respiratory infection: Secondary | ICD-10-CM

## 2021-05-17 MED ORDER — PREDNISONE 10 MG PO TABS: ORAL_TABLET | ORAL | 0 refills | Status: DC

## 2021-05-17 MED ORDER — AZITHROMYCIN 250 MG PO TABS: ORAL_TABLET | ORAL | 0 refills | Status: DC

## 2021-05-17 NOTE — Patient Instructions (Signed)
° ° ° °  If you have lab work done today you will be contacted with your lab results within the next 2 weeks.  If you have not heard from us then please contact us. The fastest way to get your results is to register for My Chart. ° ° °IF you received an x-ray today, you will receive an invoice from [facility]. Please contact  Radiology at [phone] with questions or concerns regarding your invoice.  ° °IF you received labwork today, you will receive an invoice from [facility]. Please contact [facility] at [phone] with questions or concerns regarding your invoice.  ° °Our billing staff will not be able to assist you with questions regarding bills from these companies. ° °You will be contacted with the lab results as soon as they are available. The fastest way to get your results is to activate your My Chart account. Instructions are located on the last page of this paperwork. If you have not heard from us regarding the results in 2 weeks, please contact this office. °  ° ° ° °

## 2021-05-17 NOTE — Progress Notes (Signed)
Telemedicine Encounter- SOAP NOTE Established Patient  This telephone encounter was conducted with the patient's (or proxy's) verbal consent via audio telecommunications: yes/no: Yes Patient was instructed to have this encounter in a suitably private space; and to only have persons present to whom they give permission to participate. In addition, patient identity was confirmed by use of name plus two identifiers (DOB and address).  I discussed the limitations, risks, security and privacy concerns of performing an evaluation and management service by telephone and the availability of in person appointments. I also discussed with the patient that there may be a patient responsible charge related to this service. The patient expressed understanding and agreed to proceed.  I spent a total of 17 minutes talking with the patient or their proxy.  Patient at home Provider in office  Participants: Kathrin Ruddy, NP and Haig Prophet  Chief Complaint  Patient presents with   Cough    Patient states he has COPD and every couple months he get a URI and has a a cough and congestion for. Patient states he usually get an antibiotic and goes away but this time he has been coughing up a little Phlegm.    Subjective   Elijah Barton is a 65 y.o. established patient. 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  Was out in public shortly before this started Negative covid tests at home  Admits he has continued to drink through stress and pain Has taken vitals: BP: 161/99 Pulse: 61 O2: 95-97% Temp: 98.4 Weight: 330lbs  Admits poor medication compliance through the recent stressors Does note that his tax situation has improved and he is going to have more financial flexibility going forward.   Alcohol  intake has decreased again. Less beer, more liquor - which he notes he does better with overall.   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/2015   Alcohol abuse, daily use 10/12/2014   COPD exacerbation ([place]) 09/02/2013   BMI 37.0-37.9, adult 09/02/2013   Nicotine addiction 09/02/2013   Peripheral neuropathy 03/12/2013    Past Medical History:  Diagnosis Date   Alcohol abuse    Allergies    Arthritis    Back pain    Cataracts, bilateral    COPD (chronic obstructive pulmonary disease) ([facility])    Fatty liver    Glaucoma    History of depression    Hypertension    Joint pain    all over except for hip   Peripheral neuropathy    Substance abuse ([facility])     Current Outpatient Medications  Medication Sig Dispense Refill   azithromycin (ZITHROMAX) 250 MG tablet Take 2 tablets on day 1, then 1 tablet daily on days 2 through 5 6 tablet 0   predniSONE (DELTASONE) 10 MG tablet Take 3 tablets (30 mg total) by mouth daily with breakfast for 2 days, THEN 2 tablets (20 mg total) daily with breakfast for 2 days, THEN 1 tablet (10 mg total) daily with breakfast for 2 days. 12 tablet 0   albuterol (VENTOLIN HFA) 108 (90 Base) MCG/ACT inhaler Inhale 2 puffs into the lungs every 6 (six) hours as needed for wheezing or shortness of  breath. 8 g 2   b complex vitamins tablet Take 1 tablet by mouth 3 (three) times a week.     budesonide-formoterol (SYMBICORT) 160-4.5 MCG/ACT inhaler Inhale 2 puffs into the lungs in the morning and at bedtime. (Patient not taking: No sig reported) 1 each 12   buPROPion (WELLBUTRIN SR) 150 MG 12 hr tablet Take 1 tablet (150 mg total) by mouth 2 (two) times daily. 180 tablet 0   cholecalciferol (VITAMIN D3) 25 MCG (1000 UNIT) tablet Take 1,000 Units by mouth daily.     Coenzyme Q10 (CO Q 10 PO) Take 1 tablet by mouth daily. (Patient not taking: No sig reported)      cyclobenzaprine (FLEXERIL) 10 MG tablet Take 1 tablet (10 mg total) by mouth 3 (three) times daily as needed for muscle spasms. 90 tablet 1   fluticasone (FLONASE) 50 MCG/ACT nasal spray Place 2 sprays into both nostrils daily. 16 g 6   furosemide (LASIX) 20 MG tablet Take 2 tablets (40 mg total) by mouth daily. 30 tablet 3   gabapentin (NEURONTIN) 300 MG capsule Take 1 capsule (300 mg total) by mouth 3 (three) times daily. 90 capsule 1   hydrochlorothiazide (HYDRODIURIL) 25 MG tablet Take 1 tablet (25 mg total) by mouth daily. 90 tablet 3   losartan (COZAAR) 50 MG tablet Take 1 tablet (50 mg total) by mouth daily. 90 tablet 3   naproxen (NAPROSYN) 500 MG tablet Take 1 tablet (500 mg total) by mouth 2 (two) times daily. 30 tablet 0   TURMERIC PO Take 1 tablet by mouth 2 (two) times daily. (Patient not taking: No sig reported)     YUCCA PO Take 2 capsules by mouth 2 (two) times daily. (Patient not taking: No sig reported)     No current facility-administered medications for this visit.    Allergies  Allergen Reactions   Penicillins Other (See Comments)    Childhood Allergy Has patient had a PCN reaction causing immediate rash, facial/tongue/throat swelling, SOB or lightheadedness with hypotension: Unknown Has patient had a PCN reaction causing severe rash involving mucus membranes or skin necrosis: Unknown Has patient had a PCN reaction that required hospitalization: Unknown Has patient had a PCN reaction occurring within the last 10 years: Unknown If all of the above answers are "NO", then may proceed with Cephalosporin use.     Social History   Socioeconomic History   Marital status: Divorced    Spouse name: n/a   Number of children: 0   Years of education: Not on file   Highest education level: Not on file  Occupational History   Occupation: Armed forces technical officer    Employer: [employer]  Tobacco Use   Smoking status: Every Day    Packs/day: 3.00    Years: 40.00     Pack years: 120.00    Types: Cigarettes   Smokeless tobacco: Never  Vaping Use   Vaping Use: Former  Substance and Sexual Activity   Alcohol use: Yes    Alcohol/week: 15.0 standard drinks    Types: 15 Standard drinks or equivalent per week    Comment: 0-20 drinks/week. Pint of Liquor day    Drug use: Yes    Types: Marijuana   Sexual activity: Yes    Birth control/protection: Condom  Other Topics Concern   Not on file  Social History Narrative   Lives with his girlfriend with his 2 cats. Mother died in 03/07/2013 in [place], [place] in her home with Hospice.  Daughter lives in [place], [place].   Social Determinants of Health   Financial Resource Strain: Not on file  Food Insecurity: Not on file  Transportation Needs: Not on file  Physical Activity: Not on file  Stress: Not on file  Social Connections: Not on file  Intimate Partner Violence: Not on file    Review of Systems  Constitutional:  Negative for fever and malaise/fatigue.  HENT: Negative.    Eyes: Negative.   Respiratory:  Positive for cough, sputum production and shortness of breath. Negative for hemoptysis and wheezing.   Cardiovascular:  Positive for leg swelling (has been off of hctz and lasix). Negative for chest pain, palpitations, orthopnea, claudication and PND.  Gastrointestinal: Negative.   Genitourinary: Negative.   Musculoskeletal: Negative.   Skin: Negative.   Neurological: Negative.   Endo/Heme/Allergies: Negative.   Psychiatric/Behavioral: Negative.     Objective   Vitals as reported by the patient: There were no vitals filed for this visit.  Yazn was seen today for cough.  Diagnoses and all orders for this visit:  Lower respiratory infection -     azithromycin (ZITHROMAX) 250 MG tablet; Take 2 tablets on day 1, then 1 tablet daily on days 2 through 5 -     predniSONE (DELTASONE) 10 MG tablet; Take 3 tablets (30 mg total) by mouth daily with breakfast for 2 days, THEN 2 tablets (20 mg total)  daily with breakfast for 2 days, THEN 1 tablet (10 mg total) daily with breakfast for 2 days.  Acute knee pain, unspecified laterality -     predniSONE (DELTASONE) 10 MG tablet; Take 3 tablets (30 mg total) by mouth daily with breakfast for 2 days, THEN 2 tablets (20 mg total) daily with breakfast for 2 days, THEN 1 tablet (10 mg total) daily with breakfast for 2 days.   PLAN Z pack for lower respiratory infection. Prednisone for copd exacerbation Prednisone likely to help with knee pain Continue to curb drinking Patient encouraged to call clinic with any questions, comments, or concerns.  I discussed the assessment and treatment plan with the patient. The patient was provided an opportunity to ask questions and all were answered. The patient agreed with the plan and demonstrated an understanding of the instructions.   The patient was advised to call back or seek an in-person evaluation if the symptoms worsen or if the condition fails to improve as anticipated.  I provided 17 minutes of non-face-to-face time during this encounter.  Maximiano Coss, NP  Primary Care at [facility]

## 2021-05-18 ENCOUNTER — Telehealth: Payer: Self-pay | Admitting: Registered Nurse

## 2021-05-18 ENCOUNTER — Other Ambulatory Visit: Payer: Self-pay

## 2021-05-18 DIAGNOSIS — M25569 Pain in unspecified knee: Secondary | ICD-10-CM

## 2021-05-18 DIAGNOSIS — J22 Unspecified acute lower respiratory infection: Secondary | ICD-10-CM

## 2021-05-18 MED ORDER — AZITHROMYCIN 250 MG PO TABS: ORAL_TABLET | ORAL | 0 refills | Status: AC

## 2021-05-18 MED ORDER — PREDNISONE 10 MG PO TABS: ORAL_TABLET | ORAL | 0 refills | Status: AC

## 2021-05-18 NOTE — Telephone Encounter (Signed)
Patient's rx was sent to [facility] but it needs to be sent to [facility] - please correct

## 2021-05-18 NOTE — Telephone Encounter (Signed)
Medication was corrected and [facility] to [facility].

## 2021-06-17 ENCOUNTER — Telehealth: Payer: Self-pay | Admitting: Pulmonary Disease

## 2021-06-17 MED ORDER — SPIRIVA RESPIMAT 2.5 MCG/ACT IN AERS: 2.0000 | INHALATION_SPRAY | Freq: Every day | RESPIRATORY_TRACT | 1 refills | Status: DC

## 2021-06-17 MED ORDER — PREDNISONE 10 MG PO TABS: 40.0000 mg | ORAL_TABLET | Freq: Every day | ORAL | 0 refills | Status: AC

## 2021-06-17 NOTE — Telephone Encounter (Signed)
Spoke with Dory Larsen (pt's wife per [facility]) who states pt is having increased SOB and has been for several months. Wife states pt has SOB after almost completing any activity. Wife states using inhalers as prescribed and using 2L of O2 at night. Wife states pt's cough and congestion are chronic and unchanged. Wife denies N/V/D or F/C. Pt is scheduled for OV on 07/13/21. Dr. Erin Fulling please advise.

## 2021-06-17 NOTE — Telephone Encounter (Signed)
Pt sent a message via MyChart stating;  "breathing issues"  Pt was last seen by Dr. Erin Fulling on 02/2021; pt is overdue for a 1 month f/u did speak w/ pts spouse and scheduled appt for 07/13/2021 and she stated that the pt does very minimal activity and is very winded.   Pharmacy; [facility] - [place], [place] - [address]  [address], [place] [address]   Pls regard; [phone]

## 2021-06-17 NOTE — Telephone Encounter (Signed)
Please send in 5 days of prednisone '40mg'$  daily and z-pack for COPD exacerbation.   Please send in spiriva 2.15mg 2 puffs daily to be used along with the Symbicort inhaler he has.   If he continues to feel bad he should then go to an ER for further evaluation.   JWille Glaser

## 2021-06-17 NOTE — Telephone Encounter (Signed)
Spoke with Lorie and notified her of Dr. August Albino recommendations. Reviewed instructions on medications and to reduced smoking. Mediation order placed. Pt's wife stated understanding. Nothing further needed at this time.

## 2021-07-13 ENCOUNTER — Ambulatory Visit: Payer: Medicare Other | Admitting: Pulmonary Disease

## 2021-07-14 ENCOUNTER — Encounter: Payer: Self-pay | Admitting: Registered Nurse

## 2021-07-14 ENCOUNTER — Telehealth (INDEPENDENT_AMBULATORY_CARE_PROVIDER_SITE_OTHER): Payer: Medicare Other | Admitting: Registered Nurse

## 2021-07-14 ENCOUNTER — Other Ambulatory Visit: Payer: Self-pay

## 2021-07-14 DIAGNOSIS — G8929 Other chronic pain: Secondary | ICD-10-CM | POA: Diagnosis not present

## 2021-07-14 DIAGNOSIS — J22 Unspecified acute lower respiratory infection: Secondary | ICD-10-CM

## 2021-07-14 DIAGNOSIS — M545 Low back pain, unspecified: Secondary | ICD-10-CM | POA: Diagnosis not present

## 2021-07-14 MED ORDER — DOXYCYCLINE HYCLATE 100 MG PO TABS: 100.0000 mg | ORAL_TABLET | Freq: Two times a day (BID) | ORAL | 0 refills | Status: DC

## 2021-07-14 MED ORDER — NAPROXEN 500 MG PO TABS: 500.0000 mg | ORAL_TABLET | Freq: Two times a day (BID) | ORAL | 1 refills | Status: DC

## 2021-07-14 MED ORDER — SPIRIVA RESPIMAT 2.5 MCG/ACT IN AERS: 2.0000 | INHALATION_SPRAY | Freq: Every day | RESPIRATORY_TRACT | 3 refills | Status: DC

## 2021-07-14 MED ORDER — GABAPENTIN 300 MG PO CAPS: ORAL_CAPSULE | ORAL | 1 refills | Status: AC

## 2021-07-14 MED ORDER — PREDNISONE 10 MG (21) PO TBPK: ORAL_TABLET | ORAL | 0 refills | Status: DC

## 2021-07-14 NOTE — Progress Notes (Signed)
Telemedicine Encounter- SOAP NOTE Established Patient  This telephone encounter was conducted with the patient's (or proxy's) verbal consent via audio telecommunications: yes/no: Yes Patient was instructed to have this encounter in a suitably private space; and to only have persons present to whom they give permission to participate. In addition, patient identity was confirmed by use of name plus two identifiers (DOB and address).  I discussed the limitations, risks, security and privacy concerns of performing an evaluation and management service by telephone and the availability of in person appointments. I also discussed with the patient that there may be a patient responsible charge related to this service. The patient expressed understanding and agreed to proceed.  I spent a total of 16 minutes talking with the patient or their proxy.  Patient at home Provider in office  Participants: Elijah Ruddy, NP and Haig Prophet  Chief Complaint  Patient presents with   Cough    Patient states he has been experiencing a cough , sob, body aches. Patient has COPD.    Subjective   Elijah Barton is a 65 y.o. established patient. Telephone visit today for COVID  HPI Had exacerbation of COPD in past 1-2 weeks. During that time was unsteady on feet, easily lightheaded. Notes that this has improved in past 2-3 days Yesterday was feeling close to baseline. Today woke up with cough, hoarse voice.   Notes that with change in weather, he always gets sick.   Usual pattern of bacterial illness for him is about a week of feeling bad, then improvement 1-2 days, then it really sets in. Fortunately he is fairly stable at this time, but always does better when he can start treatment soon.  Patient Active Problem List   Diagnosis Date Noted   Chronic bronchitis ([place]) 07/22/2020   Back spasm 11/27/2018   Hypoxia 09/19/2017   Notalgia 11/16/2015   Alcohol abuse, daily use 10/12/2014   COPD  exacerbation ([place]) 09/02/2013   BMI 37.0-37.9, adult 09/02/2013   Nicotine addiction 09/02/2013   Peripheral neuropathy 03/12/2013    Past Medical History:  Diagnosis Date   Alcohol abuse    Allergies    Arthritis    Back pain    Cataracts, bilateral    COPD (chronic obstructive pulmonary disease) (HCC)    Fatty liver    Glaucoma    History of depression    Hypertension    Joint pain    all over except for hip   Peripheral neuropathy    Substance abuse ([place])     Current Outpatient Medications  Medication Sig Dispense Refill   albuterol (VENTOLIN HFA) 108 (90 Base) MCG/ACT inhaler Inhale 2 puffs into the lungs every 6 (six) hours as needed for wheezing or shortness of breath. 8 g 2   budesonide-formoterol (SYMBICORT) 160-4.5 MCG/ACT inhaler Inhale 2 puffs into the lungs in the morning and at bedtime. 1 each 12   buPROPion (WELLBUTRIN SR) 150 MG 12 hr tablet Take 1 tablet (150 mg total) by mouth 2 (two) times daily. 180 tablet 0   cholecalciferol (VITAMIN D3) 25 MCG (1000 UNIT) tablet Take 1,000 Units by mouth daily.     cyclobenzaprine (FLEXERIL) 10 MG tablet Take 1 tablet (10 mg total) by mouth 3 (three) times daily as needed for muscle spasms. 90 tablet 1   doxycycline (VIBRA-TABS) 100 MG tablet Take 1 tablet (100 mg total) by mouth 2 (two) times daily. 20 tablet 0   fluticasone (FLONASE) 50 MCG/ACT nasal spray Place 2  sprays into both nostrils daily. 16 g 6   furosemide (LASIX) 20 MG tablet Take 2 tablets (40 mg total) by mouth daily. 30 tablet 3   hydrochlorothiazide (HYDRODIURIL) 25 MG tablet Take 1 tablet (25 mg total) by mouth daily. 90 tablet 3   losartan (COZAAR) 50 MG tablet Take 1 tablet (50 mg total) by mouth daily. 90 tablet 3   predniSONE (STERAPRED UNI-PAK 21 TAB) 10 MG (21) TBPK tablet Take per package instructions. Do not skip doses. Finish entire supply. 1 each 0   TURMERIC PO Take 1 tablet by mouth 2 (two) times daily.     YUCCA PO Take 2 capsules by mouth 2  (two) times daily.     b complex vitamins tablet Take 1 tablet by mouth 3 (three) times a week. (Patient not taking: Reported on 07/14/2021)     Coenzyme Q10 (CO Q 10 PO) Take 1 tablet by mouth daily. (Patient not taking: Reported on 07/14/2021)     gabapentin (NEURONTIN) 300 MG capsule Take 1 capsule (300 mg total) by mouth every morning AND 1 capsule (300 mg total) daily at 12 noon AND 2 capsules (600 mg total) at bedtime. 270 capsule 1   naproxen (NAPROSYN) 500 MG tablet Take 1 tablet (500 mg total) by mouth 2 (two) times daily. 90 tablet 1   Tiotropium Bromide Monohydrate (SPIRIVA RESPIMAT) 2.5 MCG/ACT AERS Inhale 2 puffs into the lungs daily. 8 g 3   No current facility-administered medications for this visit.    Allergies  Allergen Reactions   Penicillins Other (See Comments)    Childhood Allergy Has patient had a PCN reaction causing immediate rash, facial/tongue/throat swelling, SOB or lightheadedness with hypotension: Unknown Has patient had a PCN reaction causing severe rash involving mucus membranes or skin necrosis: Unknown Has patient had a PCN reaction that required hospitalization: Unknown Has patient had a PCN reaction occurring within the last 10 years: Unknown If all of the above answers are "NO", then may proceed with Cephalosporin use.     Social History   Socioeconomic History   Marital status: Divorced    Spouse name: n/a   Number of children: 0   Years of education: Not on file   Highest education level: Not on file  Occupational History   Occupation: Armed forces technical officer    Employer: [employer]  Tobacco Use   Smoking status: Every Day    Packs/day: 3.00    Years: 40.00    Pack years: 120.00    Types: Cigarettes   Smokeless tobacco: Never  Vaping Use   Vaping Use: Former  Substance and Sexual Activity   Alcohol use: Yes    Alcohol/week: 15.0 standard drinks    Types: 15 Standard drinks or equivalent per week    Comment: 0-20 drinks/week.  Pint of Liquor day    Drug use: Yes    Types: Marijuana   Sexual activity: Yes    Birth control/protection: Condom  Other Topics Concern   Not on file  Social History Narrative   Lives with his girlfriend with his 2 cats. Mother died in 2013/03/04 in [place], [place] in her home with Hospice.  Daughter lives in [place], [place].   Social Determinants of Health   Financial Resource Strain: Not on file  Food Insecurity: Not on file  Transportation Needs: Not on file  Physical Activity: Not on file  Stress: Not on file  Social Connections: Not on file  Intimate Partner Violence: Not on file  ROS Per hpi   Objective   Vitals as reported by the patient: There were no vitals filed for this visit.  Hammad was seen today for cough.  Diagnoses and all orders for this visit:  Lower respiratory infection -     doxycycline (VIBRA-TABS) 100 MG tablet; Take 1 tablet (100 mg total) by mouth 2 (two) times daily. -     predniSONE (STERAPRED UNI-PAK 21 TAB) 10 MG (21) TBPK tablet; Take per package instructions. Do not skip doses. Finish entire supply. -     Tiotropium Bromide Monohydrate (SPIRIVA RESPIMAT) 2.5 MCG/ACT AERS; Inhale 2 puffs into the lungs daily.  Chronic low back pain without sciatica, unspecified back pain laterality -     gabapentin (NEURONTIN) 300 MG capsule; Take 1 capsule (300 mg total) by mouth every morning AND 1 capsule (300 mg total) daily at 12 noon AND 2 capsules (600 mg total) at bedtime. -     naproxen (NAPROSYN) 500 MG tablet; Take 1 tablet (500 mg total) by mouth 2 (two) times daily.   PLAN Doxycycline and prednisone as above Refill respimat, gabapentin, and naproxen as above Return if worsening or failing to improve. Reviewed ER precautions with patient, who voiced understanding. Patient encouraged to call clinic with any questions, comments, or concerns.  I discussed the assessment and treatment plan with the patient. The patient was provided an opportunity to  ask questions and all were answered. The patient agreed with the plan and demonstrated an understanding of the instructions.   The patient was advised to call back or seek an in-person evaluation if the symptoms worsen or if the condition fails to improve as anticipated.  I provided 16 minutes of non-face-to-face time during this encounter.  Maximiano Coss, NP

## 2021-07-14 NOTE — Patient Instructions (Signed)
° ° ° °  If you have lab work done today you will be contacted with your lab results within the next 2 weeks.  If you have not heard from us then please contact us. The fastest way to get your results is to register for My Chart. ° ° °IF you received an x-ray today, you will receive an invoice from [facility]. Please contact [facility] at [phone] with questions or concerns regarding your invoice.  ° °IF you received labwork today, you will receive an invoice from [facility]. Please contact [facility] at [phone] with questions or concerns regarding your invoice.  ° °Our billing staff will not be able to assist you with questions regarding bills from these companies. ° °You will be contacted with the lab results as soon as they are available. The fastest way to get your results is to activate your My Chart account. Instructions are located on the last page of this paperwork. If you have not heard from us regarding the results in 2 weeks, please contact this office. °  ° ° ° °

## 2021-07-27 ENCOUNTER — Other Ambulatory Visit: Payer: Self-pay

## 2021-07-27 ENCOUNTER — Telehealth (INDEPENDENT_AMBULATORY_CARE_PROVIDER_SITE_OTHER): Payer: Medicare Other | Admitting: Registered Nurse

## 2021-07-27 ENCOUNTER — Encounter: Payer: Self-pay | Admitting: Registered Nurse

## 2021-07-27 VITALS — Wt 330.0 lb

## 2021-07-27 DIAGNOSIS — R9431 Abnormal electrocardiogram [ECG] [EKG]: Secondary | ICD-10-CM | POA: Diagnosis not present

## 2021-07-27 DIAGNOSIS — J988 Other specified respiratory disorders: Secondary | ICD-10-CM | POA: Diagnosis not present

## 2021-07-27 DIAGNOSIS — R0609 Other forms of dyspnea: Secondary | ICD-10-CM

## 2021-07-27 MED ORDER — COVID-19 AT HOME ANTIGEN TEST VI KIT: PACK | 0 refills | Status: DC

## 2021-07-27 MED ORDER — PREDNISONE 10 MG PO TABS: ORAL_TABLET | ORAL | 0 refills | Status: DC

## 2021-07-27 MED ORDER — LEVOFLOXACIN 500 MG PO TABS: 500.0000 mg | ORAL_TABLET | Freq: Every day | ORAL | 0 refills | Status: AC

## 2021-07-27 NOTE — Patient Instructions (Signed)
° ° ° °  If you have lab work done today you will be contacted with your lab results within the next 2 weeks.  If you have not heard from us then please contact us. The fastest way to get your results is to register for My Chart. ° ° °IF you received an x-ray today, you will receive an invoice from [facility]. Please contact [facility] at [phone] with questions or concerns regarding your invoice.  ° °IF you received labwork today, you will receive an invoice from [facility]. Please contact [facility] at [phone] with questions or concerns regarding your invoice.  ° °Our billing staff will not be able to assist you with questions regarding bills from these companies. ° °You will be contacted with the lab results as soon as they are available. The fastest way to get your results is to activate your My Chart account. Instructions are located on the last page of this paperwork. If you have not heard from us regarding the results in 2 weeks, please contact this office. °  ° ° ° °

## 2021-07-27 NOTE — Progress Notes (Signed)
Telemedicine Encounter- SOAP NOTE Established Patient  This telephone encounter was conducted with the patient's (or proxy's) verbal consent via audio telecommunications: yes/no: Yes Patient was instructed to have this encounter in a suitably private space; and to only have persons present to whom they give permission to participate. In addition, patient identity was confirmed by use of name plus two identifiers (DOB and address).  I discussed the limitations, risks, security and privacy concerns of performing an evaluation and management service by telephone and the availability of in person appointments. I also discussed with the patient that there may be a patient responsible charge related to this service. The patient expressed understanding and agreed to proceed.  I spent a total of 16 minutes talking with the patient or their proxy.  Patient at home Provider in office  Participants: Elijah Ruddy, NP and Elijah Barton  Chief Complaint  Patient presents with   Follow-up    Patient states he is still having some SOB, sinus drainage, and coughing up phlegm, and body aches     Subjective   Elijah Barton is a 65 y.o. established patient. Telephone visit today for ongoing respiratory symptoms  HPI Ongoing cough, intermittent shortness of breath, body aches. Notes his partner has similar symptoms.    Notes he did have bedbugs at home - sprayed twice Followed the pest control advice regarding staying out of the home. He is not back too soon.  These symptoms have persisted for around 6 weeks now. Improvement with steroids and abx but always recur Notes sick call to pulmonology, had been given z pack and steroids, but no resolution of symptoms.  Chart review does show abnormal ekg in April 2022. At that time, Elijah Barton had declined referral to cardiology.  Admits that he has been drinking more than he should lately, but he has again cut back.   Patient Active Problem List    Diagnosis Date Noted   Chronic bronchitis ([place]) 07/22/2020   Back spasm 11/27/2018   Hypoxia 09/19/2017   Notalgia 11/16/2015   Alcohol abuse, daily use 10/12/2014   COPD exacerbation ([place]) 09/02/2013   BMI 37.0-37.9, adult 09/02/2013   Nicotine addiction 09/02/2013   Peripheral neuropathy 03/12/2013    Past Medical History:  Diagnosis Date   Alcohol abuse    Allergies    Arthritis    Back pain    Cataracts, bilateral    COPD (chronic obstructive pulmonary disease) (HCC)    Fatty liver    Glaucoma    History of depression    Hypertension    Joint pain    all over except for hip   Peripheral neuropathy    Substance abuse ([place])     Current Outpatient Medications  Medication Sig Dispense Refill   COVID-19 At Home Antigen Test KIT Use as directed. 2 each 0   levofloxacin (LEVAQUIN) 500 MG tablet Take 1 tablet (500 mg total) by mouth daily for 7 days. 7 tablet 0   predniSONE (DELTASONE) 10 MG tablet Take 3 tablets (30 mg total) by mouth daily with breakfast for 3 days, THEN 2 tablets (20 mg total) daily with breakfast for 3 days, THEN 1 tablet (10 mg total) daily with breakfast for 3 days. 18 tablet 0   albuterol (VENTOLIN HFA) 108 (90 Base) MCG/ACT inhaler Inhale 2 puffs into the lungs every 6 (six) hours as needed for wheezing or shortness of breath. 8 g 2   b complex vitamins tablet Take 1 tablet by mouth  3 (three) times a week. (Patient not taking: No sig reported)     budesonide-formoterol (SYMBICORT) 160-4.5 MCG/ACT inhaler Inhale 2 puffs into the lungs in the morning and at bedtime. 1 each 12   buPROPion (WELLBUTRIN SR) 150 MG 12 hr tablet Take 1 tablet (150 mg total) by mouth 2 (two) times daily. 180 tablet 0   cholecalciferol (VITAMIN D3) 25 MCG (1000 UNIT) tablet Take 1,000 Units by mouth daily.     Coenzyme Q10 (CO Q 10 PO) Take 1 tablet by mouth daily. (Patient not taking: No sig reported)     cyclobenzaprine (FLEXERIL) 10 MG tablet Take 1 tablet (10 mg total) by mouth  3 (three) times daily as needed for muscle spasms. 90 tablet 1   doxycycline (VIBRA-TABS) 100 MG tablet Take 1 tablet (100 mg total) by mouth 2 (two) times daily. 20 tablet 0   fluticasone (FLONASE) 50 MCG/ACT nasal spray Place 2 sprays into both nostrils daily. 16 g 6   furosemide (LASIX) 20 MG tablet Take 2 tablets (40 mg total) by mouth daily. 30 tablet 3   gabapentin (NEURONTIN) 300 MG capsule Take 1 capsule (300 mg total) by mouth every morning AND 1 capsule (300 mg total) daily at 12 noon AND 2 capsules (600 mg total) at bedtime. 270 capsule 1   hydrochlorothiazide (HYDRODIURIL) 25 MG tablet Take 1 tablet (25 mg total) by mouth daily. 90 tablet 3   losartan (COZAAR) 50 MG tablet Take 1 tablet (50 mg total) by mouth daily. 90 tablet 3   naproxen (NAPROSYN) 500 MG tablet Take 1 tablet (500 mg total) by mouth 2 (two) times daily. 90 tablet 1   Tiotropium Bromide Monohydrate (SPIRIVA RESPIMAT) 2.5 MCG/ACT AERS Inhale 2 puffs into the lungs daily. 8 g 3   TURMERIC PO Take 1 tablet by mouth 2 (two) times daily.     YUCCA PO Take 2 capsules by mouth 2 (two) times daily.     No current facility-administered medications for this visit.    Allergies  Allergen Reactions   Penicillins Other (See Comments)    Childhood Allergy Has patient had a PCN reaction causing immediate rash, facial/tongue/throat swelling, SOB or lightheadedness with hypotension: Unknown Has patient had a PCN reaction causing severe rash involving mucus membranes or skin necrosis: Unknown Has patient had a PCN reaction that required hospitalization: Unknown Has patient had a PCN reaction occurring within the last 10 years: Unknown If all of the above answers are "NO", then may proceed with Cephalosporin use.     Social History   Socioeconomic History   Marital status: Divorced    Spouse name: n/a   Number of children: 0   Years of education: Not on file   Highest education level: Not on file  Occupational History    Occupation: Armed forces technical officer    Employer: [employer]  Tobacco Use   Smoking status: Every Day    Packs/day: 3.00    Years: 40.00    Pack years: 120.00    Types: Cigarettes   Smokeless tobacco: Never  Vaping Use   Vaping Use: Former  Substance and Sexual Activity   Alcohol use: Yes    Alcohol/week: 15.0 standard drinks    Types: 15 Standard drinks or equivalent per week    Comment: 0-20 drinks/week. Pint of Liquor day    Drug use: Yes    Types: Marijuana   Sexual activity: Yes    Birth control/protection: Condom  Other Topics Concern  Not on file  Social History Narrative   Lives with his girlfriend with his 2 cats. Mother died in March 22, 2013 in [place], [place] in her home with Hospice.  Daughter lives in [place], [place].   Social Determinants of Health   Financial Resource Strain: Not on file  Food Insecurity: Not on file  Transportation Needs: Not on file  Physical Activity: Not on file  Stress: Not on file  Social Connections: Not on file  Intimate Partner Violence: Not on file    Review of Systems  Constitutional: Negative.   HENT: Negative.    Eyes: Negative.   Respiratory:  Positive for cough, sputum production and shortness of breath. Negative for hemoptysis and wheezing.   Cardiovascular:  Positive for orthopnea and claudication. Negative for chest pain, palpitations, leg swelling and PND.  Gastrointestinal: Negative.   Genitourinary: Negative.   Musculoskeletal: Negative.   Skin: Negative.   Neurological: Negative.   Endo/Heme/Allergies: Negative.   Psychiatric/Behavioral: Negative.    All other systems reviewed and are negative.  Objective   Vitals as reported by the patient: Today's Vitals   07/27/21 1227  Weight: (!) 330 lb (149.7 kg)    Nefi was seen today for follow-up.  Diagnoses and all orders for this visit:  Respiratory infection -     levofloxacin (LEVAQUIN) 500 MG tablet; Take 1 tablet (500 mg total) by mouth daily for 7  days. -     predniSONE (DELTASONE) 10 MG tablet; Take 3 tablets (30 mg total) by mouth daily with breakfast for 3 days, THEN 2 tablets (20 mg total) daily with breakfast for 3 days, THEN 1 tablet (10 mg total) daily with breakfast for 3 days. -     COVID-19 At Home Antigen Test KIT; Use as directed.  Dyspnea on exertion -     Ambulatory referral to Cardiology  Abnormal EKG -     Ambulatory referral to Cardiology   PLAN Will treat as above. Reviewed risks, benefits, AE of treatment plan with patient who voices understanding. He will contact the office if he tests positive for COVID. Concern that this is more than illness. Given his lack of resolution with abx and steroids, will refer to cardiology. Review of EKG shows rbbb. Concern he is seeing reduced EF. ER precautions reviewed, pt voices understanding. Patient encouraged to call clinic with any questions, comments, or concerns.  I discussed the assessment and treatment plan with the patient. The patient was provided an opportunity to ask questions and all were answered. The patient agreed with the plan and demonstrated an understanding of the instructions.   The patient was advised to call back or seek an in-person evaluation if the symptoms worsen or if the condition fails to improve as anticipated.  I provided 16 minutes of non-face-to-face time during this encounter.  Maximiano Coss, NP

## 2021-08-04 ENCOUNTER — Ambulatory Visit: Payer: Medicare Other | Admitting: Pulmonary Disease

## 2021-08-04 ENCOUNTER — Other Ambulatory Visit: Payer: Self-pay

## 2021-08-04 ENCOUNTER — Encounter: Payer: Self-pay | Admitting: Pulmonary Disease

## 2021-08-04 VITALS — BP 134/86 | HR 115 | Temp 97.9°F | Ht 73.0 in | Wt 328.0 lb

## 2021-08-04 DIAGNOSIS — F32A Depression, unspecified: Secondary | ICD-10-CM

## 2021-08-04 DIAGNOSIS — Z720 Tobacco use: Secondary | ICD-10-CM

## 2021-08-04 DIAGNOSIS — J432 Centrilobular emphysema: Secondary | ICD-10-CM | POA: Diagnosis not present

## 2021-08-04 NOTE — Progress Notes (Signed)
Synopsis: Referred in May 2022 for COPD exacerbation  Subjective:   PATIENT ID: Elijah Barton GENDER: male DOB: Mar 13, 1956, MRN: [id number]   HPI  Chief Complaint  Patient presents with   Emphysema   Elijah Barton is a 65 year old male, daily smoker with obesity, nocturnal hypoxia and emphysema who returns to pulmonary clinic for COPD.   He reports his breathing has been the same since last visit.  He continues on Symbicort 2 puffs twice daily and as needed albuterol use.  He continues to smoke 3 packs of cigarettes per day.  He continues to drink significant amounts of alcohol.  He openly reports being depressed at this time and being very frustrated with the current world events which drives him to drink alcohol.  He denies any suicidal ideation at this time and denies wanting to harm anyone else.  OV 03/04/21 He reports being diagnosed with COPD in the past and has been treated for multiple exacerbations of his breathing. He has been prescribed symbicort 160-4.103mg but is using it as needed. He rarely uses his albuterol inhaler.   He is smoking 3 packs per day. He has smoked since 1976. He has tried to quit in the past but has become very irritable. He is drinking a pint of vodka daily with intermittent use 7571mof liquor total in a day.   He had CT lung cancer screening on 02/19/21 which showed peribronchiolar thickening and 56m76modules of the right lung. There are mild centrilobular changes throughout.   He had nocturnal oximetry in 2018 which he qualified for supplemental oxygen but he refused to use it due to the cost.   Former marine   Past Medical History:  Diagnosis Date   Alcohol abuse    Allergies    Arthritis    Back pain    Cataracts, bilateral    COPD (chronic obstructive pulmonary disease) (HCC)    Fatty liver    Glaucoma    History of depression    Hypertension    Joint pain    all over except for hip   Peripheral neuropathy    [place]     Family History  Problem Relation Age of Onset   Colon cancer Mother    Heart disease Mother    Stroke Mother    Cancer Mother        lung   Alcohol abuse Mother    Arthritis Mother    Asthma Mother    COPD Mother    Depression Mother    Cirrhosis Father    Alcohol abuse Father    Alcohol abuse Brother      Social History   Socioeconomic History   Marital status: Divorced    Spouse name: n/a   Number of children: 0   Years of education: Not on file   Highest education level: Not on file  Occupational History   Occupation: serArmed forces technical officer Employer: [employer]  Tobacco Use   Smoking status: Every Day    Packs/day: 3.00    Years: 40.00    Pack years: 120.00    Types: Cigarettes   Smokeless tobacco: Never  Vaping Use   Vaping Use: Former  Substance and Sexual Activity   Alcohol use: Yes    Alcohol/week: 15.0 standard drinks    Types: 15 Standard drinks or equivalent per week    Comment: 0-20 drinks/week. Pint of Liquor day    Drug  use: Yes    Types: Marijuana   Sexual activity: Yes    Birth control/protection: Condom  Other Topics Concern   Not on file  Social History Narrative   Lives with his girlfriend with his 2 cats. Mother died in 02/27/13 in [place], [place] in her home with Hospice.  Daughter lives in [place], [place].   Social Determinants of Health   Financial Resource Strain: Not on file  Food Insecurity: Not on file  Transportation Needs: Not on file  Physical Activity: Not on file  Stress: Not on file  Social Connections: Not on file  Intimate Partner Violence: Not on file     Allergies  Allergen Reactions   Penicillins Other (See Comments)    Childhood Allergy Has patient had a PCN reaction causing immediate rash, facial/tongue/throat swelling, SOB or lightheadedness with hypotension: Unknown Has patient had a PCN reaction causing severe rash involving mucus membranes or skin necrosis: Unknown Has patient had a PCN reaction  that required hospitalization: Unknown Has patient had a PCN reaction occurring within the last 10 years: Unknown If all of the above answers are "NO", then may proceed with Cephalosporin use.      Outpatient Medications Prior to Visit  Medication Sig Dispense Refill   albuterol (VENTOLIN HFA) 108 (90 Base) MCG/ACT inhaler Inhale 2 puffs into the lungs every 6 (six) hours as needed for wheezing or shortness of breath. 8 g 2   b complex vitamins tablet Take 1 tablet by mouth 3 (three) times a week. (Patient not taking: No sig reported)     budesonide-formoterol (SYMBICORT) 160-4.5 MCG/ACT inhaler Inhale 2 puffs into the lungs in the morning and at bedtime. 1 each 12   buPROPion (WELLBUTRIN SR) 150 MG 12 hr tablet Take 1 tablet (150 mg total) by mouth 2 (two) times daily. 180 tablet 0   cholecalciferol (VITAMIN D3) 25 MCG (1000 UNIT) tablet Take 1,000 Units by mouth daily.     Coenzyme Q10 (CO Q 10 PO) Take 1 tablet by mouth daily. (Patient not taking: No sig reported)     COVID-19 At Home Antigen Test KIT Use as directed. 2 each 0   cyclobenzaprine (FLEXERIL) 10 MG tablet Take 1 tablet (10 mg total) by mouth 3 (three) times daily as needed for muscle spasms. 90 tablet 1   fluticasone (FLONASE) 50 MCG/ACT nasal spray Place 2 sprays into both nostrils daily. 16 g 6   furosemide (LASIX) 20 MG tablet Take 2 tablets (40 mg total) by mouth daily. 30 tablet 3   gabapentin (NEURONTIN) 300 MG capsule Take 1 capsule (300 mg total) by mouth every morning AND 1 capsule (300 mg total) daily at 12 noon AND 2 capsules (600 mg total) at bedtime. 270 capsule 1   hydrochlorothiazide (HYDRODIURIL) 25 MG tablet Take 1 tablet (25 mg total) by mouth daily. 90 tablet 3   losartan (COZAAR) 50 MG tablet Take 1 tablet (50 mg total) by mouth daily. 90 tablet 3   naproxen (NAPROSYN) 500 MG tablet Take 1 tablet (500 mg total) by mouth 2 (two) times daily. 90 tablet 1   Tiotropium Bromide Monohydrate (SPIRIVA RESPIMAT) 2.5  MCG/ACT AERS Inhale 2 puffs into the lungs daily. 8 g 3   TURMERIC PO Take 1 tablet by mouth 2 (two) times daily.     YUCCA PO Take 2 capsules by mouth 2 (two) times daily.     doxycycline (VIBRA-TABS) 100 MG tablet Take 1 tablet (100 mg total) by mouth 2 (two)  times daily. 20 tablet 0   predniSONE (DELTASONE) 10 MG tablet Take 3 tablets (30 mg total) by mouth daily with breakfast for 3 days, THEN 2 tablets (20 mg total) daily with breakfast for 3 days, THEN 1 tablet (10 mg total) daily with breakfast for 3 days. 18 tablet 0   No facility-administered medications prior to visit.    Review of Systems  Constitutional:  Negative for chills, fever, malaise/fatigue and weight loss.  HENT:  Negative for congestion, sinus pain and sore throat.   Eyes: Negative.   Respiratory:  Positive for cough, sputum production (clear sputum), shortness of breath and wheezing. Negative for hemoptysis.   Cardiovascular:  Negative for chest pain, palpitations, orthopnea, claudication and leg swelling.  Gastrointestinal:  Negative for abdominal pain, heartburn, nausea and vomiting.  Genitourinary: Negative.   Musculoskeletal:  Negative for joint pain and myalgias.  Skin:  Negative for rash.  Neurological:  Negative for weakness.  Endo/Heme/Allergies: Negative.   Psychiatric/Behavioral:  Positive for depression and substance abuse. Negative for suicidal ideas.    Objective:   Vitals:   08/04/21 1339  BP: 134/86  Pulse: (!) 115  Temp: 97.9 F (36.6 C)  TempSrc: Oral  SpO2: (!) 89%  Weight: (!) 328 lb (148.8 kg)  Height: '6\' 1"'  (1.854 m)   Physical Exam Constitutional:      General: He is not in acute distress.    Appearance: He is obese.  HENT:     Head: Normocephalic and atraumatic.  Eyes:     Extraocular Movements: Extraocular movements intact.     Conjunctiva/sclera: Conjunctivae normal.     Pupils: Pupils are equal, round, and reactive to light.  Cardiovascular:     Rate and Rhythm: Normal  rate and regular rhythm.     Pulses: Normal pulses.     Heart sounds: Normal heart sounds. No murmur heard. Pulmonary:     Effort: Pulmonary effort is normal.     Breath sounds: Normal breath sounds. No wheezing, rhonchi or rales.  Abdominal:     General: Bowel sounds are normal.     Palpations: Abdomen is soft.  Musculoskeletal:     Right lower leg: Edema present.     Left lower leg: Edema present.  Skin:    General: Skin is warm and dry.     Findings: Erythema (of lower extremities) present.  Neurological:     General: No focal deficit present.     Mental Status: He is alert.  Psychiatric:     Comments: Patient is depressed appearing.   CBC    Component Value Date/Time   WBC 4.9 01/26/2021 1034   RBC 4.74 01/26/2021 1034   HGB 16.4 01/26/2021 1034   HCT 48.2 01/26/2021 1034   PLT 173.0 01/26/2021 1034   MCV 101.7 (H) 01/26/2021 1034   MCV 99.0 (A) 05/04/2018 1151   MCH 35.9 (H) 07/15/2018 1614   MCHC 34.0 01/26/2021 1034   RDW 12.8 01/26/2021 1034   LYMPHSABS 1.2 01/26/2021 1034   MONOABS 0.5 01/26/2021 1034   EOSABS 0.1 01/26/2021 1034   BASOSABS 0.0 01/26/2021 1034   Chest imaging: CT Chest Lung Cancer Screening 02/19/21 No pleural effusion, airspace consolidation, or atelectasis. Mild centrilobular emphysema with diffuse bronchial wall thickening. Two tiny lung nodules are noted in the right lung. The largest is in the upper lobe measuring 2.2 mm. No suspicious lung nodules identified at this time.  PFT: No flowsheet data found.    Assessment & Plan:  Centrilobular emphysema ([place])  Tobacco use  Depression, unspecified depression type - Plan: Ambulatory referral to Psychiatry  Discussion: Elijah Barton is a 65 year old male, daily smoker with obesity, nocturnal hypoxia and emphysema who returns to pulmonary clinic for COPD.   He has centrilobular emphysema based on his CT chest scan this year.   He is to use his symbicort inhaler scheduled 2 puffs  twice daily along with as needed albuterol.   He should continue to use supplemental oxygen at night when sleeping.  He continues to smoke 3 packs of cigarettes daily.  He is not in a good spot to quit at this time due to issues with his ongoing depression and alcohol use.  We had a very long discussion about his alcohol use and issues with depression requiring counseling and therapy.  He is amenable to me placing a psychiatry referral for further evaluation and management.  He will follow up as needed.  Freda Jackson, MD [facility] Office: [phone]    Current Outpatient Medications:    albuterol (VENTOLIN HFA) 108 (90 Base) MCG/ACT inhaler, Inhale 2 puffs into the lungs every 6 (six) hours as needed for wheezing or shortness of breath., Disp: 8 g, Rfl: 2   b complex vitamins tablet, Take 1 tablet by mouth 3 (three) times a week. (Patient not taking: No sig reported), Disp: , Rfl:    budesonide-formoterol (SYMBICORT) 160-4.5 MCG/ACT inhaler, Inhale 2 puffs into the lungs in the morning and at bedtime., Disp: 1 each, Rfl: 12   buPROPion (WELLBUTRIN SR) 150 MG 12 hr tablet, Take 1 tablet (150 mg total) by mouth 2 (two) times daily., Disp: 180 tablet, Rfl: 0   cholecalciferol (VITAMIN D3) 25 MCG (1000 UNIT) tablet, Take 1,000 Units by mouth daily., Disp: , Rfl:    Coenzyme Q10 (CO Q 10 PO), Take 1 tablet by mouth daily. (Patient not taking: No sig reported), Disp: , Rfl:    COVID-19 At Home Antigen Test KIT, Use as directed., Disp: 2 each, Rfl: 0   cyclobenzaprine (FLEXERIL) 10 MG tablet, Take 1 tablet (10 mg total) by mouth 3 (three) times daily as needed for muscle spasms., Disp: 90 tablet, Rfl: 1   fluticasone (FLONASE) 50 MCG/ACT nasal spray, Place 2 sprays into both nostrils daily., Disp: 16 g, Rfl: 6   furosemide (LASIX) 20 MG tablet, Take 2 tablets (40 mg total) by mouth daily., Disp: 30 tablet, Rfl: 3   gabapentin (NEURONTIN) 300 MG capsule, Take 1 capsule  (300 mg total) by mouth every morning AND 1 capsule (300 mg total) daily at 12 noon AND 2 capsules (600 mg total) at bedtime., Disp: 270 capsule, Rfl: 1   hydrochlorothiazide (HYDRODIURIL) 25 MG tablet, Take 1 tablet (25 mg total) by mouth daily., Disp: 90 tablet, Rfl: 3   losartan (COZAAR) 50 MG tablet, Take 1 tablet (50 mg total) by mouth daily., Disp: 90 tablet, Rfl: 3   naproxen (NAPROSYN) 500 MG tablet, Take 1 tablet (500 mg total) by mouth 2 (two) times daily., Disp: 90 tablet, Rfl: 1   Tiotropium Bromide Monohydrate (SPIRIVA RESPIMAT) 2.5 MCG/ACT AERS, Inhale 2 puffs into the lungs daily., Disp: 8 g, Rfl: 3   TURMERIC PO, Take 1 tablet by mouth 2 (two) times daily., Disp: , Rfl:    YUCCA PO, Take 2 capsules by mouth 2 (two) times daily., Disp: , Rfl:

## 2021-08-04 NOTE — Patient Instructions (Addendum)
Continue symbicort 2 puffs twice daily.  We will refer you to psychiatry/psychology for counseling.

## 2021-08-11 ENCOUNTER — Encounter: Payer: Self-pay | Admitting: Pulmonary Disease

## 2021-08-20 ENCOUNTER — Other Ambulatory Visit: Payer: Self-pay

## 2021-08-20 ENCOUNTER — Ambulatory Visit: Payer: Medicare Other | Admitting: Cardiology

## 2021-08-20 ENCOUNTER — Encounter: Payer: Self-pay | Admitting: Cardiology

## 2021-08-20 VITALS — BP 153/82 | HR 99 | Resp 16 | Ht 73.0 in | Wt 326.0 lb

## 2021-08-20 DIAGNOSIS — R0609 Other forms of dyspnea: Secondary | ICD-10-CM

## 2021-08-20 DIAGNOSIS — M7989 Other specified soft tissue disorders: Secondary | ICD-10-CM

## 2021-08-20 DIAGNOSIS — I700 Atherosclerosis of aorta: Secondary | ICD-10-CM

## 2021-08-20 DIAGNOSIS — F17210 Nicotine dependence, cigarettes, uncomplicated: Secondary | ICD-10-CM

## 2021-08-20 DIAGNOSIS — F1010 Alcohol abuse, uncomplicated: Secondary | ICD-10-CM

## 2021-08-20 DIAGNOSIS — J439 Emphysema, unspecified: Secondary | ICD-10-CM

## 2021-08-20 DIAGNOSIS — R9431 Abnormal electrocardiogram [ECG] [EKG]: Secondary | ICD-10-CM | POA: Diagnosis not present

## 2021-08-20 DIAGNOSIS — I2510 Atherosclerotic heart disease of native coronary artery without angina pectoris: Secondary | ICD-10-CM

## 2021-08-20 MED ORDER — ROSUVASTATIN CALCIUM 10 MG PO TABS: 10.0000 mg | ORAL_TABLET | Freq: Every day | ORAL | 0 refills | Status: DC

## 2021-08-20 MED ORDER — ASPIRIN EC 81 MG PO TBEC: 81.0000 mg | DELAYED_RELEASE_TABLET | Freq: Every day | ORAL | 11 refills | Status: AC

## 2021-08-20 NOTE — Progress Notes (Signed)
Date:  08/20/2021   ID:  Elijah Barton, DOB Oct 23, 1955, MRN [id number]  PCP:  Maximiano Coss, NP  Cardiologist:  Rex Kras, DO, [facility] (established care 08/20/2021)  REASON FOR CONSULT: Dyspnea on exertion  REQUESTING PHYSICIAN:  Maximiano Coss, NP [id number] [place],  [place] [id number]  Chief Complaint  Patient presents with   Abnormal ECG   Dyspnea on exertion   New Patient (Initial Visit)    HPI  Elijah Barton is a 65 y.o. 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   He is referred to the office at the request of Maximiano Coss, NP for evaluation of dyspnea on exertion.  Shortness of breath: Patient states that he has been having shortness of breath for the last 1 year but it has been getting progressively worse.  Patient states " I have caused my issues I smoke too much, drink too much, due to the sedentary lifestyle."  Patient experiences typical orthopnea, no PND, and has lower extremity swelling has been progressively worse.  Given his underlying emphysema he is supposed to be using nocturnal oxygen 3 L  daily but uses it as needed basis and has not required additional support.  He smokes 3 packs/day and used to drink 1 L of vodka per day and now is down to 500 mL/day.   Patient denies any chest pain at rest or with effort related activities.  No structured exercise program or daily routine.  FUNCTIONAL STATUS: No structured exercise program or daily routine.    ALLERGIES: Allergies  Allergen Reactions   Penicillins Other (See Comments)    Childhood Allergy Has patient had a PCN reaction causing  immediate rash, facial/tongue/throat swelling, SOB or lightheadedness with hypotension: Unknown Has patient had a PCN reaction causing severe rash involving mucus membranes or skin necrosis: Unknown Has patient had a PCN reaction that required hospitalization: Unknown Has patient had a PCN reaction occurring within the last 10 years: Unknown If all of the above answers are "NO", then may proceed with Cephalosporin use.     MEDICATION LIST PRIOR TO VISIT: Current Meds  Medication Sig   albuterol (VENTOLIN HFA) 108 (90 Base) MCG/ACT inhaler Inhale 2 puffs into the lungs every 6 (six) hours as needed for wheezing or shortness of breath.   aspirin EC 81 MG tablet Take 1 tablet (81 mg total) by mouth daily. Swallow whole.   b complex vitamins tablet Take 1 tablet by mouth 3 (three) times a week.   budesonide-formoterol (SYMBICORT) 160-4.5 MCG/ACT inhaler Inhale 2 puffs into the lungs in the morning and at bedtime.   buPROPion (WELLBUTRIN SR) 150 MG 12 hr tablet Take 1 tablet (150 mg total) by mouth 2 (two) times daily.   cholecalciferol (VITAMIN D3) 25 MCG (1000 UNIT) tablet Take 1,000 Units by mouth daily.   Coenzyme Q10 (CO Q 10 PO) Take 1 tablet by mouth daily.   cyclobenzaprine (FLEXERIL) 10 MG tablet Take 1 tablet (10 mg total) by mouth 3 (three) times daily as needed for muscle spasms.   fluticasone (FLONASE) 50 MCG/ACT nasal spray Place 2 sprays into both nostrils daily.   furosemide (LASIX) 20 MG tablet Take 2 tablets (40 mg total)  by mouth daily.   gabapentin (NEURONTIN) 300 MG capsule Take 1 capsule (300 mg total) by mouth every morning AND 1 capsule (300 mg total) daily at 12 noon AND 2 capsules (600 mg total) at bedtime.   hydrochlorothiazide (HYDRODIURIL) 25 MG tablet Take 1 tablet (25 mg total) by mouth daily.   losartan (COZAAR) 50 MG tablet Take 1 tablet (50 mg total) by mouth daily.   naproxen (NAPROSYN) 500 MG tablet Take 1 tablet (500 mg total) by mouth 2 (two) times daily.    rosuvastatin (CRESTOR) 10 MG tablet Take 1 tablet (10 mg total) by mouth at bedtime.   Tiotropium Bromide Monohydrate (SPIRIVA RESPIMAT) 2.5 MCG/ACT AERS Inhale 2 puffs into the lungs daily.   TURMERIC PO Take 1 tablet by mouth 2 (two) times daily.     PAST MEDICAL HISTORY: Past Medical History:  Diagnosis Date   Alcohol abuse    Allergies    Arthritis    Back pain    Cataracts, bilateral    COPD (chronic obstructive pulmonary disease) (HCC)    Fatty liver    Glaucoma    History of depression    Hypertension    Joint pain    all over except for hip   Peripheral neuropathy    Substance abuse ([place])     PAST SURGICAL HISTORY: Past Surgical History:  Procedure Laterality Date   LYMPH NODE BIOPSY  1970   neck   TONSILLECTOMY AND ADENOIDECTOMY  1968    FAMILY HISTORY: The patient family history includes Alcohol abuse in his brother, father, and mother; Arthritis in his mother; Asthma in his mother; COPD in his mother; Cancer in his mother; Cirrhosis in his father; Colon cancer in his mother; Depression in his mother; Heart disease in his mother; Stroke in his mother.  SOCIAL HISTORY:  The patient  reports that he has been smoking cigarettes. He has a 120.00 pack-year smoking history. He has never used smokeless tobacco. He reports current alcohol use of about 15.0 standard drinks per week. He reports current drug use. Drug: Marijuana.  REVIEW OF SYSTEMS: Review of Systems  Constitutional: Negative for chills and fever.  HENT:  Negative for hoarse voice and nosebleeds.   Eyes:  Negative for discharge, double vision and pain.  Cardiovascular:  Positive for dyspnea on exertion and leg swelling. Negative for chest pain, claudication, near-syncope, orthopnea, palpitations, paroxysmal nocturnal dyspnea and syncope.  Respiratory:  Positive for shortness of breath. Negative for hemoptysis.   Musculoskeletal:  Negative for muscle cramps and myalgias.  Gastrointestinal:  Negative for  abdominal pain, constipation, diarrhea, hematemesis, hematochezia, melena, nausea and vomiting.  Neurological:  Negative for dizziness and light-headedness.   PHYSICAL EXAM: Vitals with BMI 08/20/2021 08/04/2021 07/27/2021  Height _0  _1  -  Weight 326 lbs 328 lbs 330 lbs  BMI 85.92 92.44 -  Systolic 628 638 -  Diastolic 82 86 -  Pulse 99 115 -    CONSTITUTIONAL: Appears older than stated age, well-developed and well-nourished. No acute distress.  SKIN: Skin is warm and dry. No rash noted. No cyanosis. No pallor. No jaundice HEAD: Normocephalic and atraumatic.  EYES: No scleral icterus MOUTH/THROAT: Moist oral membranes.  NECK: No JVD present. No thyromegaly noted. No carotid bruits  LYMPHATIC: No visible cervical adenopathy.  CHEST Normal respiratory effort. No intercostal retractions  LUNGS: Decreased breath sounds bilaterally, No stridor. No wheezes. No rales.  CARDIOVASCULAR: Tachycardia, regular rate and rhythm, positive S1-S2, no murmurs rubs or gallops appreciated  due tachycardia. ABDOMINAL: Obese, soft, nontender, nondistended, positive bowel sounds all 4 quadrants. No apparent ascites.  EXTREMITIES: bilateral peripheral edema up to mid shin, poor hyigen, suspect onychomycosis of the nails, bilateral lower extremity erythema and crusting of the skin, difficult to palpate DP and PT pulses.   HEMATOLOGIC: No significant bruising NEUROLOGIC: Oriented to person, place, and time. Nonfocal. Normal muscle tone.  PSYCHIATRIC: Normal mood and affect. Normal behavior. Cooperative  RADIOLOGY: CT chest lung cancer screening: 02/19/2021 1. Lung-RADS 2, benign appearance or behavior. Continue annual screening with low-dose chest CT without contrast in 12 months. 2. Coronary artery calcifications. 3. Ascending thoracic aortic aneurysm. Recommend semi-annual imaging followup by CTA or MRA and referral to cardiothoracic surgery if not already obtained. This recommendation follows 2010  ACCF/AHA/AATS/ACR/ASA/SCA/SCAI/SIR/STS/SVM Guidelines for the Diagnosis and Management of Patients With Thoracic Aortic Disease. Circulation. 2010; 121: R443-X540. Aortic aneurysm NOS (ICD10-I71.9) Aortic Atherosclerosis (ICD10-I70.0) and Emphysema (ICD10-J43.9).  CARDIAC DATABASE: EKG: 08/20/2021: Normal sinus rhythm, 95 bpm, left axis, left anterior fascicular block, IRBBB, consider old inferior infarct without underlying injury pattern.   Echocardiogram: No results found for this or any previous visit from the past 1095 days.  Stress Testing: No results found for this or any previous visit from the past 1095 days.  Heart Catheterization: None  LABORATORY DATA: CBC Latest Ref Rng & Units 01/26/2021 06/19/2020 07/15/2018  WBC 4.0 - 10.5 K/uL 4.9 6.2 5.7  Hemoglobin 13.0 - 17.0 g/dL 16.4 16.9 20.1(H)  Hematocrit 39.0 - 52.0 % 48.2 49.6 57.4(H)  Platelets 150.0 - 400.0 K/uL 173.0 161.0 143(L)    CMP Latest Ref Rng & Units 01/26/2021 06/19/2020 07/15/2018  Glucose 70 - 99 mg/dL 106(H) 95 102(H)  BUN 6 - 23 mg/dL _0 Creatinine 0.40 - 1.50 mg/dL 0.81 0.77 0.74  Sodium 135 - 145 mEq/L 139 139 142  Potassium 3.5 - 5.1 mEq/L 4.0 4.0 4.2  Chloride 96 - 112 mEq/L 96 103 101  CO2 19 - 32 mEq/L 33(H) 28 30  Calcium 8.4 - 10.5 mg/dL 9.5 9.3 9.5  Total Protein 6.0 - 8.3 g/dL 7.1 6.6 7.2  Total Bilirubin 0.2 - 1.2 mg/dL 0.7 1.2 1.7(H)  Alkaline Phos 39 - 117 U/L 68 59 64  AST 0 - 37 U/L 19 34 39  ALT 0 - 53 U/L _1 Lipid Panel     Component Value Date/Time   CHOL 165 01/26/2021 1034   CHOL 131 06/28/2017 1134   TRIG 85.0 01/26/2021 1034   HDL 58.10 01/26/2021 1034   HDL 51 06/28/2017 1134   CHOLHDL 3 01/26/2021 1034   VLDL 17.0 01/26/2021 1034   LDLCALC 89 01/26/2021 1034   LDLCALC 65 06/28/2017 1134   LABVLDL 15 06/28/2017 1134    No components found for: NTPROBNP Recent Labs    01/26/21 1032  PROBNP 39.0   Recent Labs    01/26/21 1034  TSH 3.90     BMP Recent Labs    01/26/21 1034  NA 139  K 4.0  CL 96  CO2 33*  GLUCOSE 106*  BUN 18  CREATININE 0.81  CALCIUM 9.5    HEMOGLOBIN A1C Lab Results  Component Value Date   HGBA1C 5.2 06/19/2020    IMPRESSION:    ICD-10-CM   1. DOE (dyspnea on exertion)  R06.09 EKG 08-QPYP    Basic metabolic panel    Magnesium    Pro b natriuretic peptide (BNP)    PCV ECHOCARDIOGRAM COMPLETE  2. Abnormal EKG  R94.31 PCV ECHOCARDIOGRAM COMPLETE    PCV MYOCARDIAL PERFUSION WO LEXISCAN    3. Leg swelling  M79.89 VAS [place] LOWER EXTREMITY VENOUS (DVT)    4. Atherosclerosis of aorta (HCC)  I70.0 PCV ECHOCARDIOGRAM COMPLETE    PCV MYOCARDIAL PERFUSION WO LEXISCAN    rosuvastatin (CRESTOR) 10 MG tablet    5. Coronary atherosclerosis due to calcified coronary lesion  I25.10 PCV ECHOCARDIOGRAM COMPLETE   I25.84 PCV MYOCARDIAL PERFUSION WO LEXISCAN    aspirin EC 81 MG tablet    rosuvastatin (CRESTOR) 10 MG tablet    6. Cigarette smoker  F17.210     7. Alcohol abuse, daily use  F10.10     8. Pulmonary emphysema, unspecified emphysema type [facility])  J43.9        RECOMMENDATIONS: Tonnie Friedel is a 65 y.o. 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  DOE (dyspnea on exertion) Multifactorial: Obesity, deconditioning, acid-base disorders, underlying emphysema/COPD, nocturnal hypoxemia, questionable congestive heart failure. Check BMP, magnesium, proBNP. Echocardiogram will be ordered to evaluate for structural heart disease and left ventricular systolic function. Check exercise nuclear stress test for reversible ischemia in the setting of multiple risk factors, estimated 10-year risk of ASCVD greater than 20%, aortic  and coronary atherosclerosis, and EKG raises concern for old inferior infarct.  Abnormal EKG nuclear stress test for reversible ischemia in the setting of multiple risk factors, estimated 10-year risk of ASCVD greater than 20%, aortic and coronary atherosclerosis, and EKG raises concern for old inferior infarct.  Leg swelling Differential diagnosis includes low oncotic pressure due to malnutrition, chronic venous insufficiency, dependent edema, infection, heart failure, etc. Will defer infectious work-up to PCP. Will check lower extremity duplex for DVT and chronic venous insufficiency. I suspect that he does have chronic venous insufficiency based on physical examination finding and he states that the swelling is better in the morning. Check proBNP  Atherosclerosis of aorta (HCC) and coronary atherosclerosis due to calcified coronary lesion: Start aspirin and statin therapy. Personally reviewed the CT scan that was done as part of lung cancer screening.  Patient is noted to have coronary calcification in all 3 coronary vessels.  The severity and additional evaluation is limited given the data available. Ischemic work-up as described above  Cigarette smoker Tobacco cessation counseling: Currently smoking 3 packs/day   Patient was informed of the dangers of tobacco abuse including stroke, cancer, and MI, as well as benefits of tobacco cessation. Patient is willing to quit at this time. 6 mins were spent counseling patient cessation techniques. We discussed various methods to help quit smoking, including deciding on a date to quit, joining a support group, pharmacological agents- nicotine gum/patch/lozenges. Patient states that he will discuss with PCP. I will reassess his progress at the next follow-up visit  Alcohol abuse, daily use Used to drink 1 L of vodka per day. Now is down to 500 cc/day.  Educated on the importance of complete cessation of alcohol.  Patient is aware of the  cardiotoxicity effects of alcohol.  Patient states that he will make a conscious effort with regards to complete cessation.   I have asked him to discuss this further with PCP.  The need for GI evaluation will defer that to PCP as well  Pulmonary emphysema, with  nocturnal hypoxemia, on nasal cannula oxygen: Will defer to pulmonary medicine.    FINAL MEDICATION LIST END OF ENCOUNTER: Meds ordered this encounter  Medications   aspirin EC 81 MG tablet    Sig: Take 1 tablet (81 mg total) by mouth daily. Swallow whole.    Dispense:  30 tablet    Refill:  11   rosuvastatin (CRESTOR) 10 MG tablet    Sig: Take 1 tablet (10 mg total) by mouth at bedtime.    Dispense:  90 tablet    Refill:  0    Medications Discontinued During This Encounter  Medication Reason   COVID-19 At Home Antigen Test KIT Error   YUCCA PO Error     Current Outpatient Medications:    albuterol (VENTOLIN HFA) 108 (90 Base) MCG/ACT inhaler, Inhale 2 puffs into the lungs every 6 (six) hours as needed for wheezing or shortness of breath., Disp: 8 g, Rfl: 2   aspirin EC 81 MG tablet, Take 1 tablet (81 mg total) by mouth daily. Swallow whole., Disp: 30 tablet, Rfl: 11   b complex vitamins tablet, Take 1 tablet by mouth 3 (three) times a week., Disp: , Rfl:    budesonide-formoterol (SYMBICORT) 160-4.5 MCG/ACT inhaler, Inhale 2 puffs into the lungs in the morning and at bedtime., Disp: 1 each, Rfl: 12   buPROPion (WELLBUTRIN SR) 150 MG 12 hr tablet, Take 1 tablet (150 mg total) by mouth 2 (two) times daily., Disp: 180 tablet, Rfl: 0   cholecalciferol (VITAMIN D3) 25 MCG (1000 UNIT) tablet, Take 1,000 Units by mouth daily., Disp: , Rfl:    Coenzyme Q10 (CO Q 10 PO), Take 1 tablet by mouth daily., Disp: , Rfl:    cyclobenzaprine (FLEXERIL) 10 MG tablet, Take 1 tablet (10 mg total) by mouth 3 (three) times daily as needed for muscle spasms., Disp: 90 tablet, Rfl: 1   fluticasone (FLONASE) 50 MCG/ACT nasal spray, Place 2 sprays into  both nostrils daily., Disp: 16 g, Rfl: 6   furosemide (LASIX) 20 MG tablet, Take 2 tablets (40 mg total) by mouth daily., Disp: 30 tablet, Rfl: 3   gabapentin (NEURONTIN) 300 MG capsule, Take 1 capsule (300 mg total) by mouth every morning AND 1 capsule (300 mg total) daily at 12 noon AND 2 capsules (600 mg total) at bedtime., Disp: 270 capsule, Rfl: 1   hydrochlorothiazide (HYDRODIURIL) 25 MG tablet, Take 1 tablet (25 mg total) by mouth daily., Disp: 90 tablet, Rfl: 3   losartan (COZAAR) 50 MG tablet, Take 1 tablet (50 mg total) by mouth daily., Disp: 90 tablet, Rfl: 3   naproxen (NAPROSYN) 500 MG tablet, Take 1 tablet (500 mg total) by mouth 2 (two) times daily., Disp: 90 tablet, Rfl: 1   rosuvastatin (CRESTOR) 10 MG tablet, Take 1 tablet (10 mg total) by mouth at bedtime., Disp: 90 tablet, Rfl: 0   Tiotropium Bromide Monohydrate (SPIRIVA RESPIMAT) 2.5 MCG/ACT AERS, Inhale 2 puffs into the lungs daily., Disp: 8 g, Rfl: 3   TURMERIC PO, Take 1 tablet by mouth 2 (two) times daily., Disp: , Rfl:   Orders Placed This Encounter  Procedures   Basic metabolic panel   Magnesium   Pro b natriuretic peptide (BNP)   PCV MYOCARDIAL PERFUSION WO LEXISCAN   EKG 12-Lead   PCV ECHOCARDIOGRAM COMPLETE   VAS [place] LOWER EXTREMITY VENOUS (DVT)    There are no Patient Instructions on file for this visit.   --Continue cardiac medications as reconciled in final medication  list. --Return in about 4 weeks (around 09/17/2021) for Follow up, Dyspnea, Review test results. Or sooner if needed. --Continue follow-up with your primary care physician regarding the management of your other chronic comorbid conditions.  Patient's questions and concerns were addressed to his satisfaction. He voices understanding of the instructions provided during this encounter.   This note was created using a voice recognition software as a result there may be grammatical errors inadvertently enclosed that do not reflect the nature of  this encounter. Every attempt is made to correct such errors.  Rex Kras, [place], [facility]  Pager: [phone] Office: [phone]

## 2021-08-30 ENCOUNTER — Other Ambulatory Visit: Payer: Medicare Other

## 2021-08-31 ENCOUNTER — Other Ambulatory Visit: Payer: Medicare Other

## 2021-09-05 IMAGING — US US ABDOMEN LIMITED RUQ/ASCITES
1 series · 14 of 25 positions shown · non-contrast
Comparison: CT abdomen pelvis dated 05/04/2018.

CLINICAL DATA: 64-year-old male with alcohol use.

EXAM:
ULTRASOUND ABDOMEN LIMITED RIGHT UPPER QUADRANT

[Series 1: us abdomen limited ruq/ascites · 0.28mm/px · 14 of 45 slices shown]
[im 1/45]
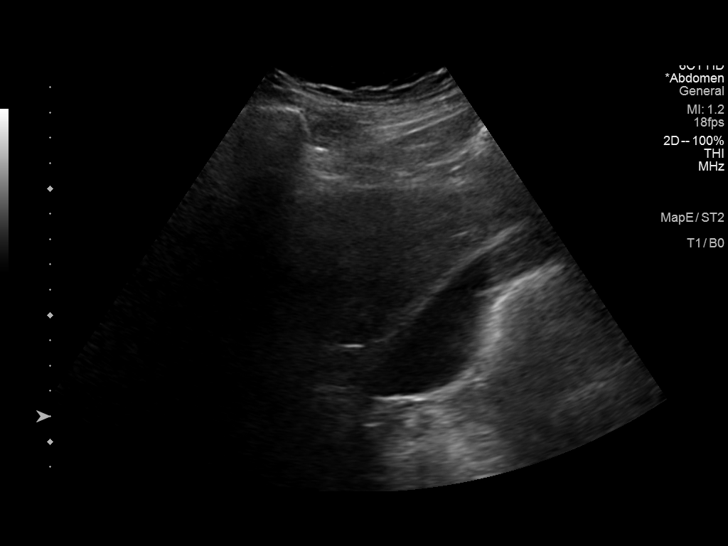
[im 4/45]
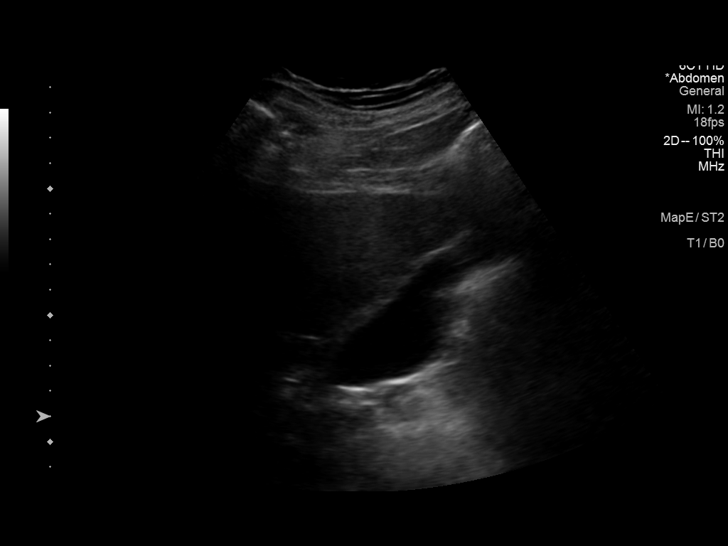
[im 8/45]
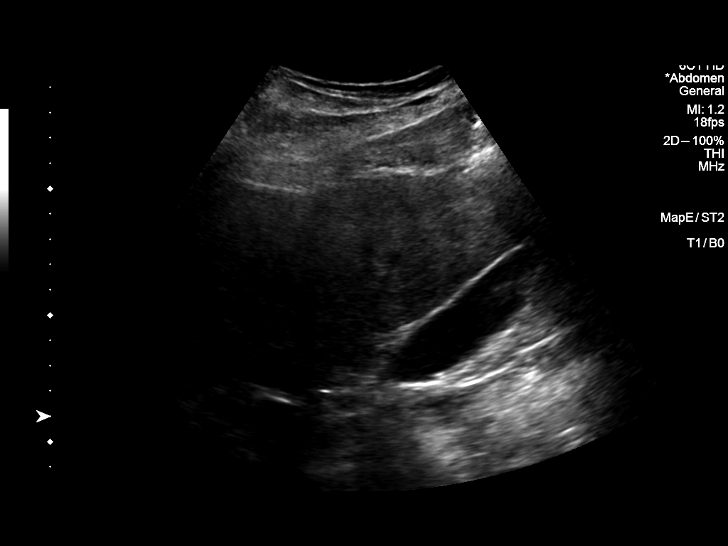
[im 12/45]
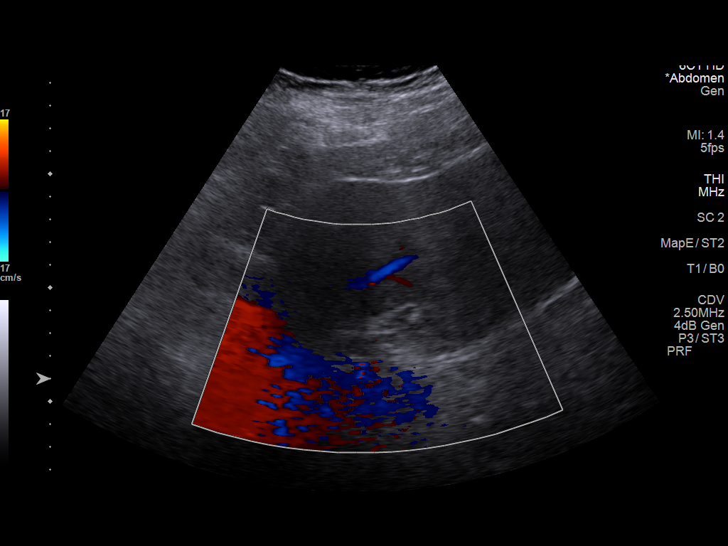
[im 15/45]
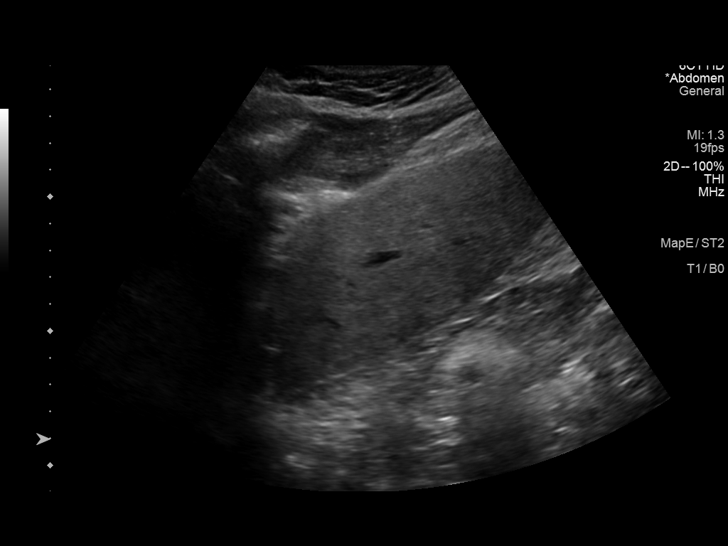
[im 17/45]
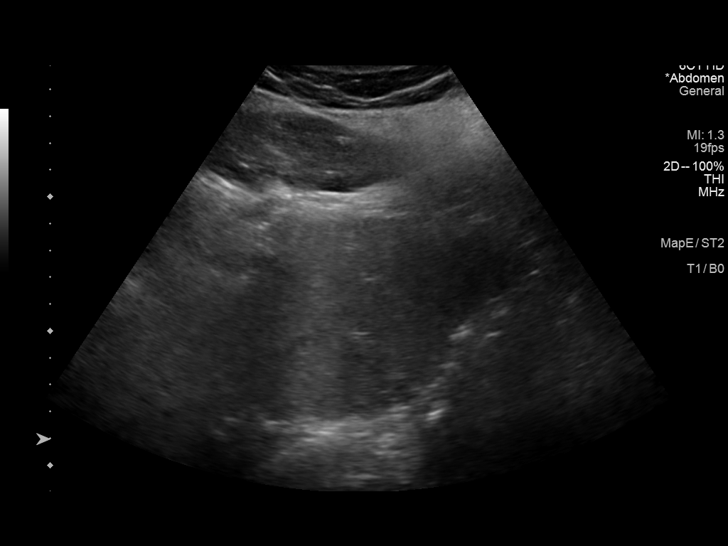
[im 21/45]
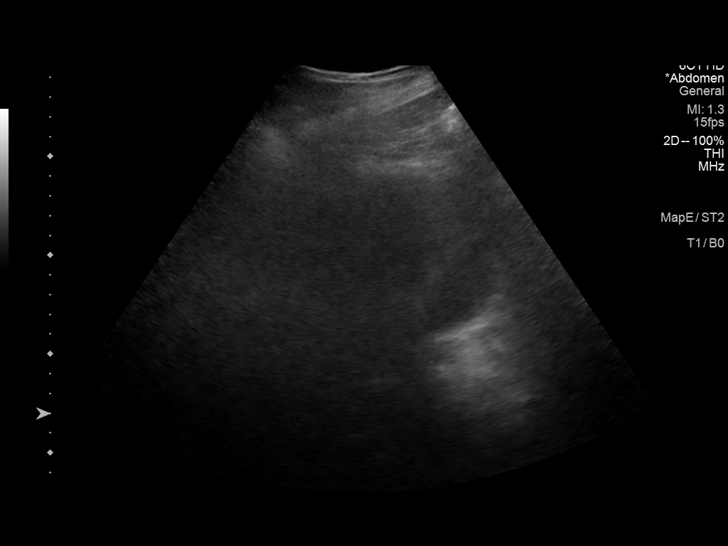
[im 24/45]
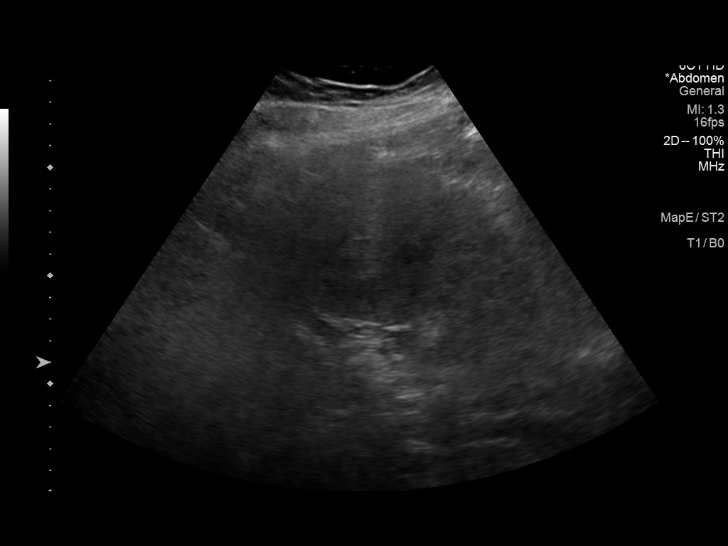
[im 28/45]
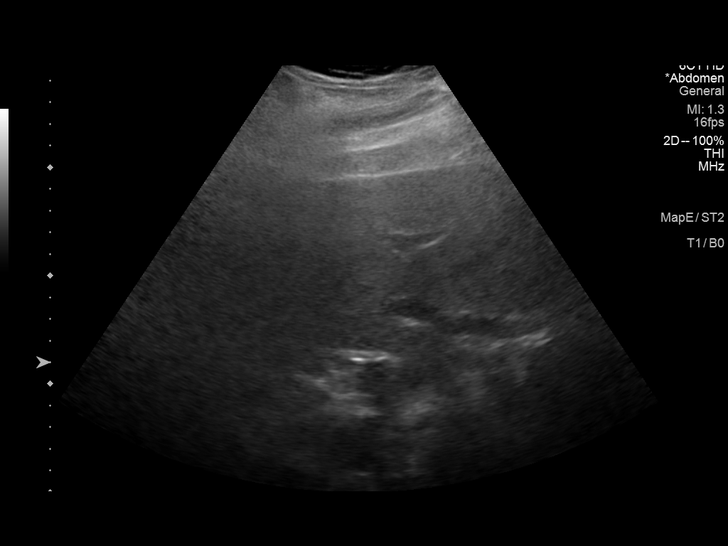
[im 30/45]
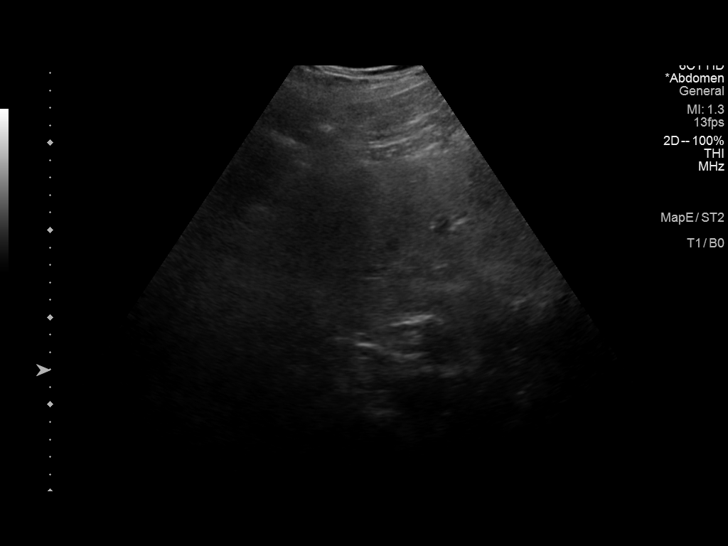
[im 34/45]
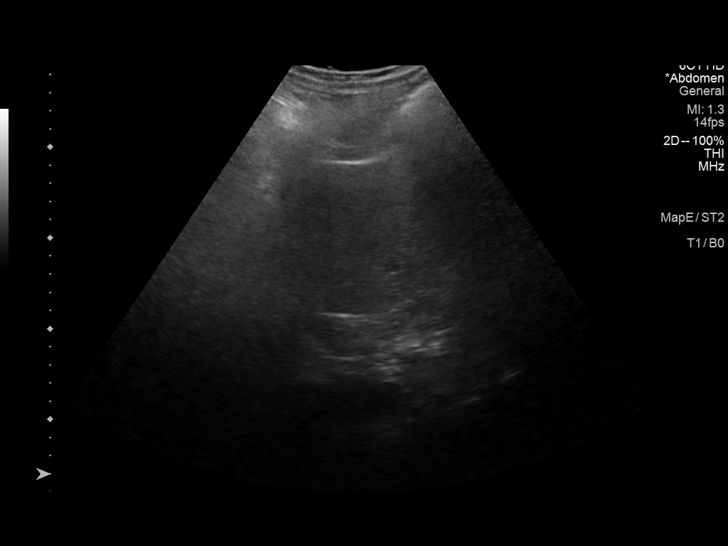
[im 37/45]
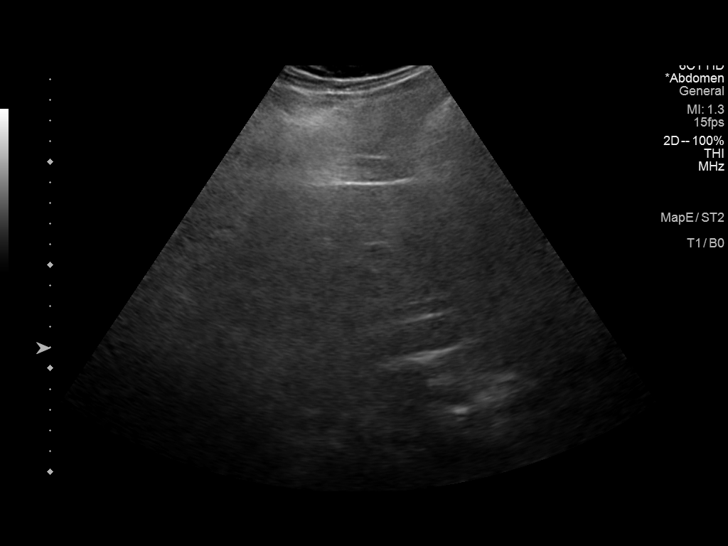
[im 41/45]
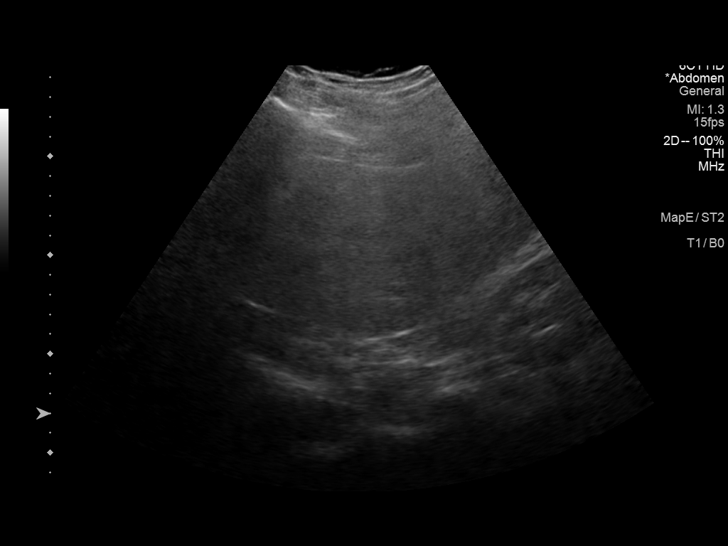
[im 45/45]
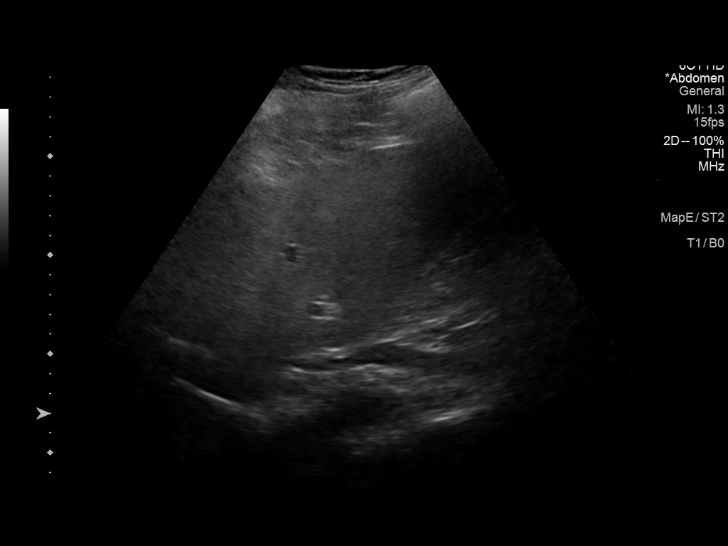

[14 of 25 positions shown; findings below may reference images not displayed]

FINDINGS: Gallbladder:

No gallstones or wall thickening visualized. No sonographic Murphy
sign noted by sonographer.

Common bile duct:

Diameter: 4 mm

Liver:

There is diffuse increased liver echogenicity most commonly seen in
the setting of fatty infiltration. Superimposed inflammation or
fibrosis is not excluded. Clinical correlation is recommended.
Portal vein is patent on color Doppler imaging with normal direction
of blood flow towards the liver.

Other: None.
IMPRESSION: Fatty liver, otherwise unremarkable right upper quadrant ultrasound.

## 2021-09-13 ENCOUNTER — Other Ambulatory Visit: Payer: Medicare Other

## 2021-09-13 IMAGING — CT CT CHEST LUNG CANCER SCREENING LOW DOSE W/O CM
2 of 4 series · 15 of 36 positions shown, 18 images · non-contrast
Comparison: None.

CLINICAL DATA: Lung cancer screening. Forty-seven pack-year
history. Asymptomatic.

EXAM:
CT CHEST WITHOUT CONTRAST LOW-DOSE FOR LUNG CANCER SCREENING
TECHNIQUE: Multidetector CT imaging of the chest was performed following the
standard protocol without IV contrast.

[Series 3: lung thins 1.0 · axial · 0.82mm/px · z∈[-131,+220]mm · 12 of 387 slices shown, 15 images]
[im 18/387  mediastinal]
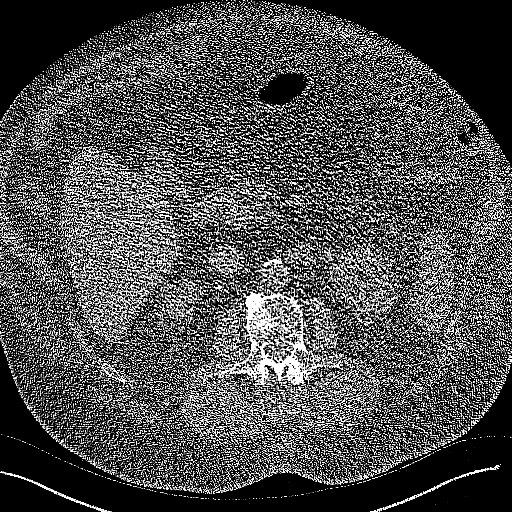
[im 18/387  lung]
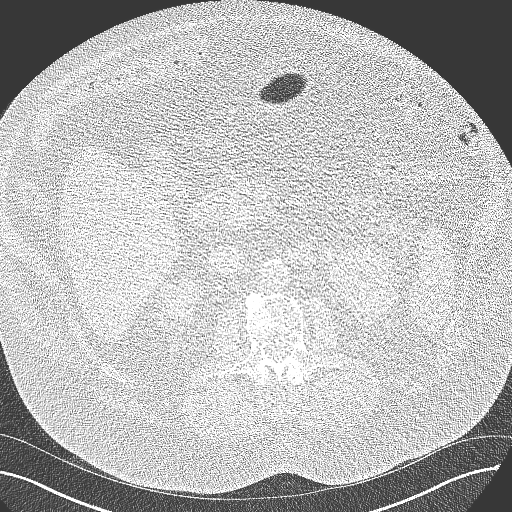
[im 53/387  lung]
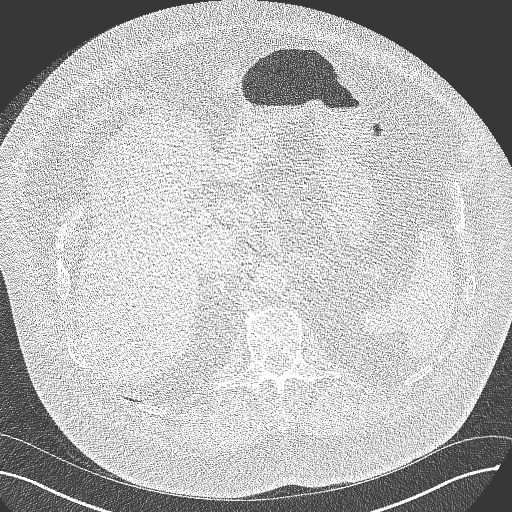
[im 88/387  lung]
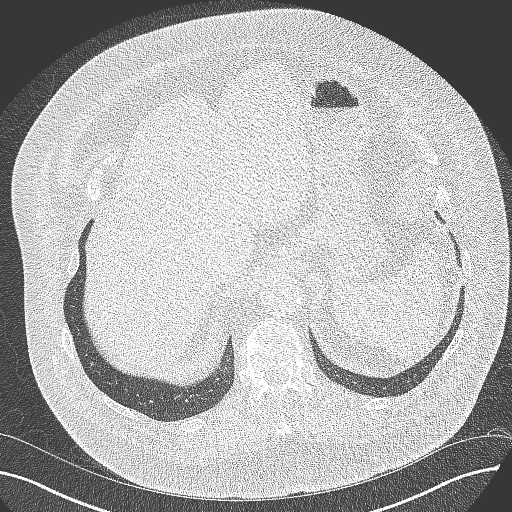
[im 123/387  lung]
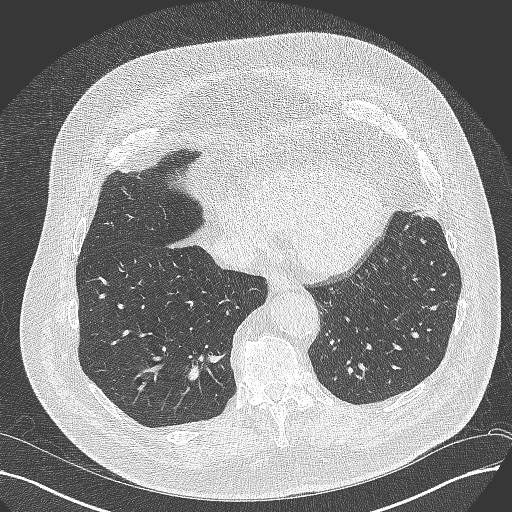
[im 141/387  mediastinal]
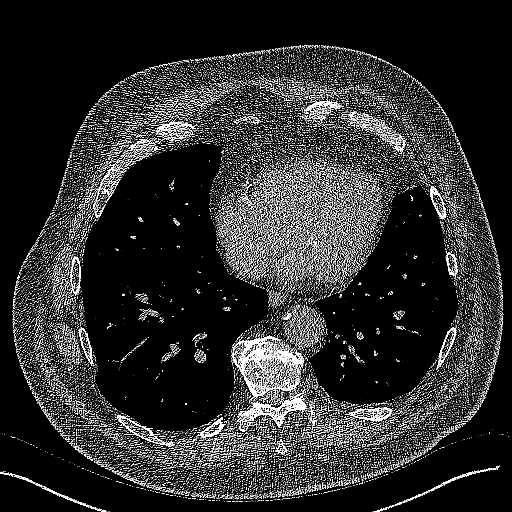
[im 141/387  lung]
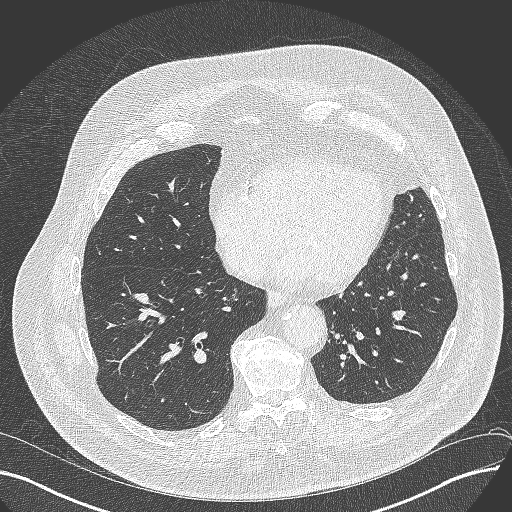
[im 176/387  lung]
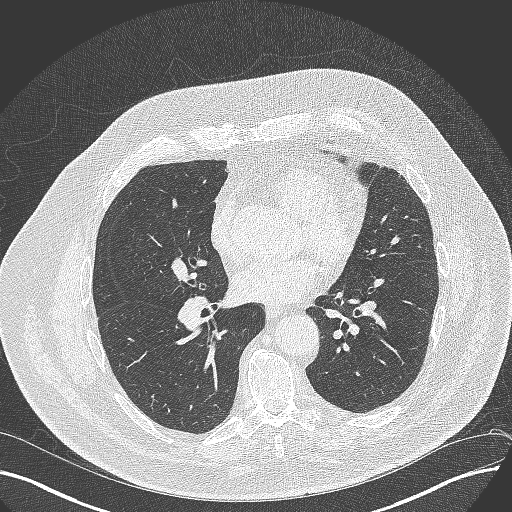
[im 211/387  lung]
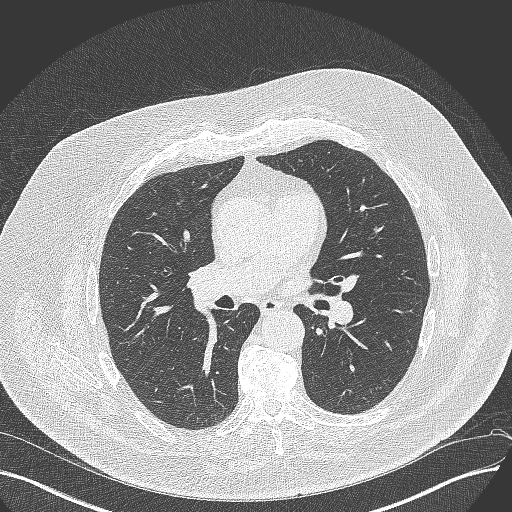
[im 246/387  lung]
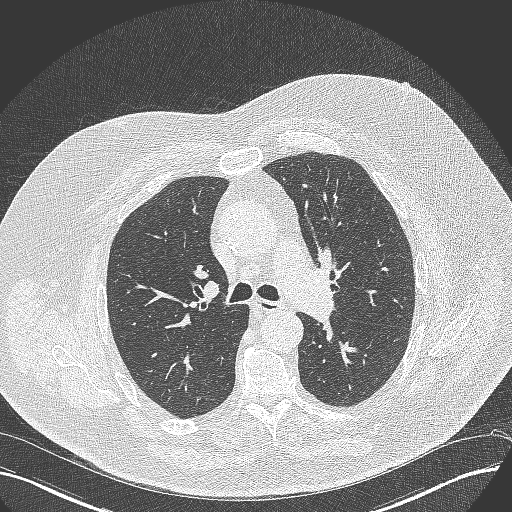
[im 264/387  mediastinal]
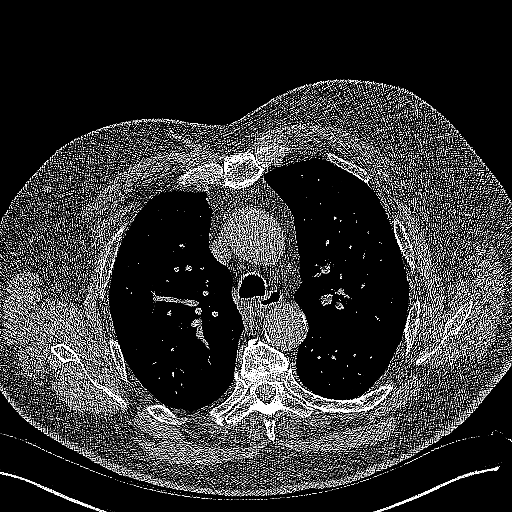
[im 264/387  lung]
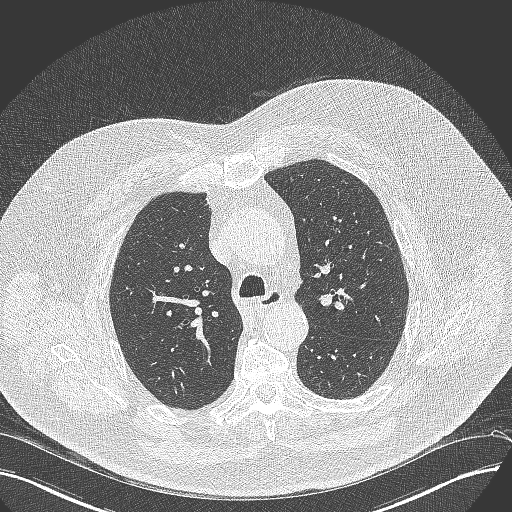
[im 299/387  lung]
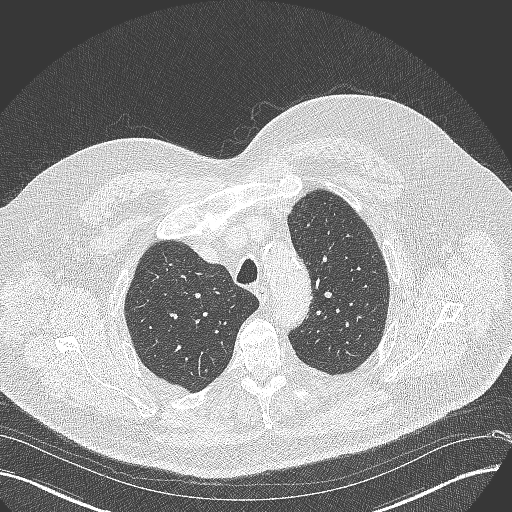
[im 334/387  lung]
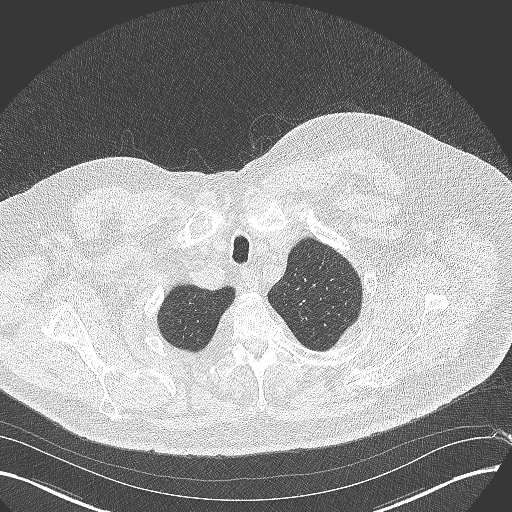
[im 369/387  lung]
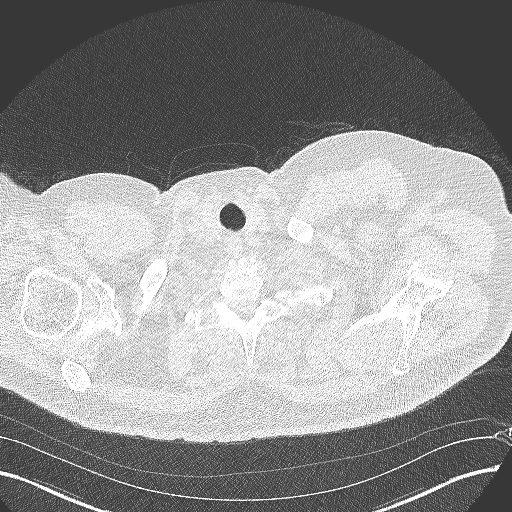

[Series 5: coronal · coronal · 0.76mm/px · 3 of 162 slices shown]
[im 33/162  lung]
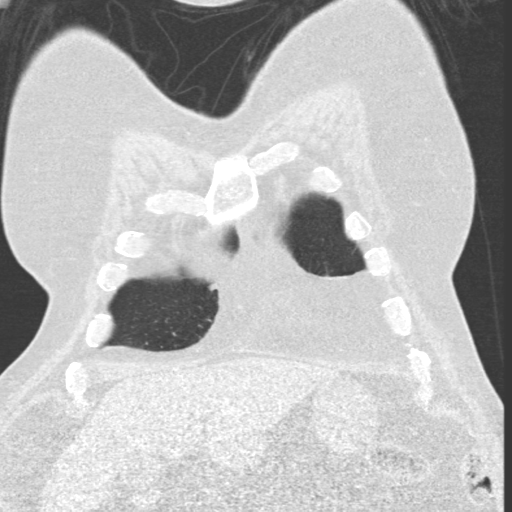
[im 65/162  lung]
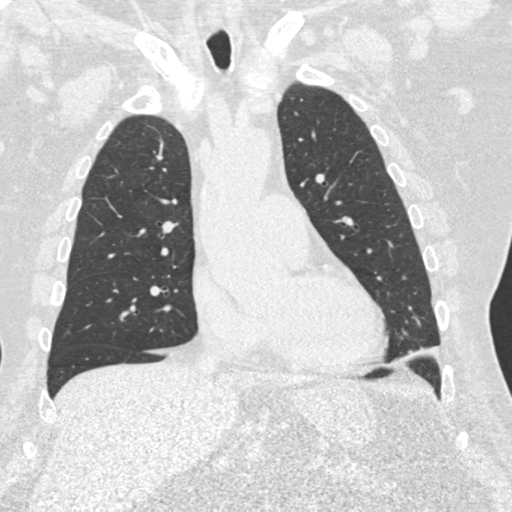
[im 97/162  lung]
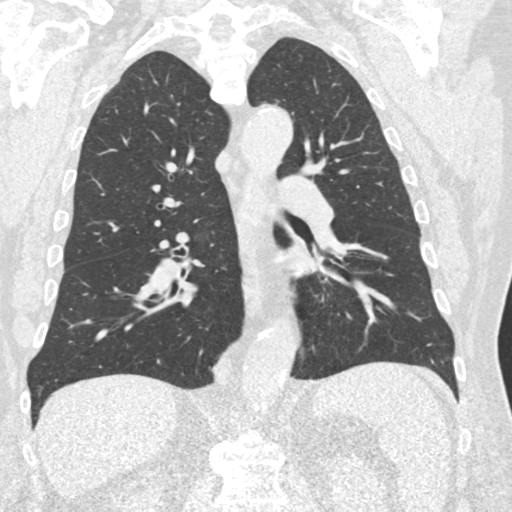

[15 of 36 positions shown; findings below may reference images not displayed]

FINDINGS: Cardiovascular: Normal heart size. Aortic atherosclerosis.
Aneurysmal dilatation of the ascending thoracic aorta Measures
cm, image 36/2. coronary artery calcifications.

Mediastinum/Nodes: No enlarged mediastinal, hilar, or axillary lymph
nodes. Thyroid gland, trachea, and esophagus demonstrate no
significant findings.

Lungs/Pleura: No pleural effusion, airspace consolidation, or
atelectasis. Mild centrilobular emphysema with diffuse bronchial
wall thickening. Two tiny lung nodules are noted in the right lung.
The largest is in the upper lobe measuring 2.2 mm. No suspicious
lung nodules identified at this time.

Upper Abdomen: No acute abnormality.

Musculoskeletal: No chest wall mass or suspicious bone lesions
identified.
IMPRESSION: 1. Lung-RADS 2, benign appearance or behavior. Continue annual
screening with low-dose chest CT without contrast in 12 months.
2. Coronary artery calcifications.
3. Ascending thoracic aortic aneurysm. Recommend semi-annual imaging
followup by CTA or MRA and referral to cardiothoracic surgery if not
already obtained. This recommendation follows 4323
ACCF/AHA/AATS/ACR/ASA/SCA/DEMOND/MERLOS/UZODINMA/DEEQA RAYAAN Guidelines for the
Diagnosis and Management of Patients With Thoracic Aortic Disease.
Circulation. 4323; 121: E266-e369. Aortic aneurysm NOS (6EL6L-DBE.W)

Aortic Atherosclerosis (6EL6L-S91.1) and Emphysema (6EL6L-UCS.X).

## 2021-09-20 ENCOUNTER — Other Ambulatory Visit: Payer: Medicare Other

## 2021-09-20 ENCOUNTER — Ambulatory Visit: Payer: Medicare Other | Admitting: Cardiology

## 2021-09-21 ENCOUNTER — Telehealth (INDEPENDENT_AMBULATORY_CARE_PROVIDER_SITE_OTHER): Payer: Medicare Other | Admitting: Registered Nurse

## 2021-09-21 ENCOUNTER — Encounter: Payer: Self-pay | Admitting: Registered Nurse

## 2021-09-21 ENCOUNTER — Other Ambulatory Visit: Payer: Self-pay

## 2021-09-21 VITALS — HR 92 | Wt 320.0 lb

## 2021-09-21 DIAGNOSIS — J988 Other specified respiratory disorders: Secondary | ICD-10-CM

## 2021-09-21 MED ORDER — DOXYCYCLINE HYCLATE 100 MG PO TABS: 100.0000 mg | ORAL_TABLET | Freq: Two times a day (BID) | ORAL | 0 refills | Status: DC

## 2021-09-21 NOTE — Progress Notes (Signed)
Telemedicine Encounter- SOAP NOTE Established Patient  This telephone encounter was conducted with the patient's (or proxy's) verbal consent via audio telecommunications: yes/no: Yes Patient was instructed to have this encounter in a suitably private space; and to only have persons present to whom they give permission to participate. In addition, patient identity was confirmed by use of name plus two identifiers (DOB and address).  I discussed the limitations, risks, security and privacy concerns of performing an evaluation and management service by telephone and the availability of in person appointments. I also discussed with the patient that there may be a patient responsible charge related to this service. The patient expressed understanding and agreed to proceed.  I spent a total of 14 minutes talking with the patient or their proxy.  Patient at home Provider in office  Participants: Kathrin Ruddy, NP and Haig Prophet  Chief Complaint  Patient presents with   URI    Patient states he is having SOB , bad cough, mucus, and low winds. Patient states he has not taken any medications at this time.    Subjective   Elijah Barton is a 65 y.o. established patient. Telephone visit today for URI  HPI New onset shob, bad cough with mucus production, and shob easily on exertion Had onset within the past week - worsening a lot over past three days.  Notes that he has cut back on alcohol down to 1 pt per night.  He had drank some beer recently and thinks that has exacerbated his current  Similar symptoms to previous URIs. No new concerns. Usually knocks this out with abx.  Continues to use chronic respiratory meds as listed.   Patient Active Problem List   Diagnosis Date Noted   COPD with acute exacerbation ([place]) 10/04/2021   Chronic bronchitis ([place]) 07/22/2020   Back spasm 11/27/2018   Hypoxia 09/19/2017   Notalgia 11/16/2015   Alcohol abuse, daily use 10/12/2014   COPD  exacerbation ([place]) 09/02/2013   BMI 37.0-37.9, adult 09/02/2013   Nicotine addiction 09/02/2013   Peripheral neuropathy 03/12/2013    Past Medical History:  Diagnosis Date   Alcohol abuse    Allergies    Arthritis    Back pain    Cataracts, bilateral    COPD (chronic obstructive pulmonary disease) (HCC)    Fatty liver    Glaucoma    History of depression    Hypertension    Joint pain    all over except for hip   Peripheral neuropathy    Substance abuse ([place])     Current Outpatient Medications  Medication Sig Dispense Refill   albuterol (VENTOLIN HFA) 108 (90 Base) MCG/ACT inhaler Inhale 2 puffs into the lungs every 6 (six) hours as needed for wheezing or shortness of breath. 8 g 2   aspirin EC 81 MG tablet Take 1 tablet (81 mg total) by mouth daily. Swallow whole. 30 tablet 11   b complex vitamins tablet Take 1 tablet by mouth 3 (three) times a week.     buPROPion (WELLBUTRIN SR) 150 MG 12 hr tablet Take 1 tablet (150 mg total) by mouth 2 (two) times daily. 180 tablet 0   cholecalciferol (VITAMIN D3) 25 MCG (1000 UNIT) tablet Take 1,000 Units by mouth daily.     Coenzyme Q10 (CO Q 10 PO) Take 1 tablet by mouth daily.     fluticasone (FLONASE) 50 MCG/ACT nasal spray Place 2 sprays into both nostrils daily. 16 g 6   furosemide (LASIX)  20 MG tablet Take 2 tablets (40 mg total) by mouth daily. 30 tablet 3   gabapentin (NEURONTIN) 300 MG capsule Take 1 capsule (300 mg total) by mouth every morning AND 1 capsule (300 mg total) daily at 12 noon AND 2 capsules (600 mg total) at bedtime. 270 capsule 1   rosuvastatin (CRESTOR) 10 MG tablet Take 1 tablet (10 mg total) by mouth at bedtime. 90 tablet 0   TURMERIC PO Take 1 tablet by mouth 2 (two) times daily.     budesonide-formoterol (SYMBICORT) 160-4.5 MCG/ACT inhaler Inhale 2 puffs into the lungs in the morning and at bedtime. 1 each 12   doxycycline (VIBRA-TABS) 100 MG tablet Take 1 tablet (100 mg total) by mouth 2 (two) times daily. 14  tablet 0   fexofenadine (ALLEGRA) 180 MG tablet Take 1 tablet (180 mg total) by mouth daily. 30 tablet 0   folic acid (FOLVITE) 1 MG tablet Take 1 tablet (1 mg total) by mouth daily. 30 tablet 2   losartan (COZAAR) 50 MG tablet TAKE ONE TABLET BY MOUTH daily 90 tablet 3   thiamine 100 MG tablet Take 1 tablet (100 mg total) by mouth daily. 30 tablet 2   Tiotropium Bromide Monohydrate (SPIRIVA RESPIMAT) 2.5 MCG/ACT AERS Inhale 2 puffs into the lungs daily. 8 g 3   No current facility-administered medications for this visit.    Allergies  Allergen Reactions   Penicillins Other (See Comments)    Childhood Allergy Has patient had a PCN reaction causing immediate rash, facial/tongue/throat swelling, SOB or lightheadedness with hypotension: Unknown Has patient had a PCN reaction causing severe rash involving mucus membranes or skin necrosis: Unknown Has patient had a PCN reaction that required hospitalization: Unknown Has patient had a PCN reaction occurring within the last 10 years: Unknown If all of the above answers are "NO", then may proceed with Cephalosporin use.     Social History   Socioeconomic History   Marital status: Divorced    Spouse name: n/a   Number of children: 0   Years of education: Not on file   Highest education level: Not on file  Occupational History   Occupation: Armed forces technical officer    Employer: [employer]  Tobacco Use   Smoking status: Every Day    Packs/day: 3.00    Years: 40.00    Pack years: 120.00    Types: Cigarettes   Smokeless tobacco: Never  Vaping Use   Vaping Use: Former  Substance and Sexual Activity   Alcohol use: Yes    Alcohol/week: 15.0 standard drinks    Types: 15 Standard drinks or equivalent per week    Comment: 0-20 drinks/week. Pint of Liquor day    Drug use: Yes    Types: Marijuana   Sexual activity: Yes    Birth control/protection: Condom  Other Topics Concern   Not on file  Social History Narrative   Lives with  his girlfriend with his 2 cats. Mother died in 03/30/2013 in [place], [place] in her home with Hospice.  Daughter lives in [place], [place].   Social Determinants of Health   Financial Resource Strain: Not on file  Food Insecurity: Not on file  Transportation Needs: Not on file  Physical Activity: Not on file  Stress: Not on file  Social Connections: Not on file  Intimate Partner Violence: Not on file    ROS Per hpi   Objective   Vitals as reported by the patient: Today's Vitals   09/21/21  1208  Pulse: 92  SpO2: 91%  Weight: (!) 320 lb (145.2 kg)    Shady was seen today for uri.  Diagnoses and all orders for this visit:  Respiratory infection -     Discontinue: doxycycline (VIBRA-TABS) 100 MG tablet; Take 1 tablet (100 mg total) by mouth 2 (two) times daily. (Patient not taking: Reported on 10/05/2021)    PLAN Doxycycline as above Discussed supportive OTC care and ways to avoid this going forward.  ER precautions reviewed Patient encouraged to call clinic with any questions, comments, or concerns.  I discussed the assessment and treatment plan with the patient. The patient was provided an opportunity to ask questions and all were answered. The patient agreed with the plan and demonstrated an understanding of the instructions.   The patient was advised to call back or seek an in-person evaluation if the symptoms worsen or if the condition fails to improve as anticipated.  I provided 16 minutes of non-face-to-face time during this encounter.  Maximiano Coss, NP

## 2021-09-21 NOTE — Patient Instructions (Signed)
° ° ° °  If you have lab work done today you will be contacted with your lab results within the next 2 weeks.  If you have not heard from us then please contact us. The fastest way to get your results is to register for My Chart. ° ° °IF you received an x-ray today, you will receive an invoice from [facility]. Please contact [facility] at [phone] with questions or concerns regarding your invoice.  ° °IF you received labwork today, you will receive an invoice from [facility]. Please contact [facility] at [phone] with questions or concerns regarding your invoice.  ° °Our billing staff will not be able to assist you with questions regarding bills from these companies. ° °You will be contacted with the lab results as soon as they are available. The fastest way to get your results is to activate your My Chart account. Instructions are located on the last page of this paperwork. If you have not heard from us regarding the results in 2 weeks, please contact this office. °  ° ° ° °

## 2021-09-22 ENCOUNTER — Telehealth: Payer: Medicare Other | Admitting: Registered Nurse

## 2021-09-29 ENCOUNTER — Other Ambulatory Visit: Payer: Medicare Other

## 2021-10-04 ENCOUNTER — Other Ambulatory Visit: Payer: Self-pay

## 2021-10-04 ENCOUNTER — Encounter (HOSPITAL_COMMUNITY): Payer: Self-pay

## 2021-10-04 ENCOUNTER — Emergency Department (HOSPITAL_COMMUNITY): Payer: Medicare Other

## 2021-10-04 ENCOUNTER — Inpatient Hospital Stay (HOSPITAL_COMMUNITY)
Admission: EM | Admit: 2021-10-04 | Discharge: 2021-10-06 | DRG: 190 | Disposition: A | Discharge status: Home / Self Care | Payer: Medicare Other | Attending: Internal Medicine | Admitting: Internal Medicine

## 2021-10-04 DIAGNOSIS — Z7982 Long term (current) use of aspirin: Secondary | ICD-10-CM

## 2021-10-04 DIAGNOSIS — Z880 Allergy status to penicillin: Secondary | ICD-10-CM

## 2021-10-04 DIAGNOSIS — D72829 Elevated white blood cell count, unspecified: Secondary | ICD-10-CM | POA: Diagnosis present

## 2021-10-04 DIAGNOSIS — K760 Fatty (change of) liver, not elsewhere classified: Secondary | ICD-10-CM | POA: Diagnosis present

## 2021-10-04 DIAGNOSIS — K7460 Unspecified cirrhosis of liver: Secondary | ICD-10-CM | POA: Diagnosis present

## 2021-10-04 DIAGNOSIS — E872 Acidosis, unspecified: Secondary | ICD-10-CM | POA: Diagnosis present

## 2021-10-04 DIAGNOSIS — R188 Other ascites: Secondary | ICD-10-CM

## 2021-10-04 DIAGNOSIS — J441 Chronic obstructive pulmonary disease with (acute) exacerbation: Principal | ICD-10-CM | POA: Diagnosis present

## 2021-10-04 DIAGNOSIS — F17210 Nicotine dependence, cigarettes, uncomplicated: Secondary | ICD-10-CM | POA: Diagnosis present

## 2021-10-04 DIAGNOSIS — J9621 Acute and chronic respiratory failure with hypoxia: Secondary | ICD-10-CM | POA: Diagnosis present

## 2021-10-04 DIAGNOSIS — E785 Hyperlipidemia, unspecified: Secondary | ICD-10-CM | POA: Diagnosis present

## 2021-10-04 DIAGNOSIS — I509 Heart failure, unspecified: Secondary | ICD-10-CM | POA: Diagnosis present

## 2021-10-04 DIAGNOSIS — I110 Hypertensive heart disease with heart failure: Secondary | ICD-10-CM | POA: Diagnosis present

## 2021-10-04 DIAGNOSIS — Z825 Family history of asthma and other chronic lower respiratory diseases: Secondary | ICD-10-CM

## 2021-10-04 DIAGNOSIS — Z818 Family history of other mental and behavioral disorders: Secondary | ICD-10-CM

## 2021-10-04 DIAGNOSIS — Z7951 Long term (current) use of inhaled steroids: Secondary | ICD-10-CM

## 2021-10-04 DIAGNOSIS — Z811 Family history of alcohol abuse and dependence: Secondary | ICD-10-CM

## 2021-10-04 DIAGNOSIS — Z8249 Family history of ischemic heart disease and other diseases of the circulatory system: Secondary | ICD-10-CM

## 2021-10-04 DIAGNOSIS — Z79899 Other long term (current) drug therapy: Secondary | ICD-10-CM

## 2021-10-04 DIAGNOSIS — Z20822 Contact with and (suspected) exposure to covid-19: Secondary | ICD-10-CM | POA: Diagnosis present

## 2021-10-04 DIAGNOSIS — F1010 Alcohol abuse, uncomplicated: Secondary | ICD-10-CM | POA: Diagnosis present

## 2021-10-04 DIAGNOSIS — F32A Depression, unspecified: Secondary | ICD-10-CM | POA: Diagnosis present

## 2021-10-04 DIAGNOSIS — R609 Edema, unspecified: Secondary | ICD-10-CM

## 2021-10-04 LAB — COMPREHENSIVE METABOLIC PANEL
ALT: 26 U/L (ref 0–44)
AST: 31 U/L (ref 15–41)
Albumin: 3.9 g/dL (ref 3.5–5.0)
Alkaline Phosphatase: 62 U/L (ref 38–126)
Anion gap: 8 (ref 5–15)
BUN: 6 mg/dL — ABNORMAL LOW (ref 8–23)
CO2: 29 mmol/L (ref 22–32)
Calcium: 8.8 mg/dL — ABNORMAL LOW (ref 8.9–10.3)
Chloride: 99 mmol/L (ref 98–111)
Creatinine, Ser: 1.01 mg/dL (ref 0.61–1.24)
GFR, Estimated: >60 mL/min (ref >60)
Glucose, Bld: 132 mg/dL — ABNORMAL HIGH (ref 70–99)
Potassium: 3.9 mmol/L (ref 3.5–5.1)
Sodium: 136 mmol/L (ref 135–145)
Total Bilirubin: 1.8 mg/dL — ABNORMAL HIGH (ref 0.3–1.2)
Total Protein: 7.3 g/dL (ref 6.5–8.1)

## 2021-10-04 LAB — CBC WITH DIFFERENTIAL/PLATELET
Abs Immature Granulocytes: 0.09 10*3/uL — ABNORMAL HIGH (ref 0.00–0.07)
Basophils Absolute: 0 10*3/uL (ref 0.0–0.1)
Basophils Relative: 0 %
Eosinophils Absolute: 0 10*3/uL (ref 0.0–0.5)
Eosinophils Relative: 0 %
HCT: 45.2 % (ref 39.0–52.0)
Hemoglobin: 15.2 g/dL (ref 13.0–17.0)
Immature Granulocytes: 1 %
Lymphocytes Relative: 8 %
Lymphs Abs: 1.3 10*3/uL (ref 0.7–4.0)
MCH: 35.7 pg — ABNORMAL HIGH (ref 26.0–34.0)
MCHC: 33.6 g/dL (ref 30.0–36.0)
MCV: 106.1 fL — ABNORMAL HIGH (ref 80.0–100.0)
Monocytes Absolute: 1 10*3/uL (ref 0.1–1.0)
Monocytes Relative: 6 %
Neutro Abs: 14.2 10*3/uL — ABNORMAL HIGH (ref 1.7–7.7)
Neutrophils Relative %: 85 %
Platelets: 163 10*3/uL (ref 150–400)
RBC: 4.26 MIL/uL (ref 4.22–5.81)
RDW: 13.3 % (ref 11.5–15.5)
WBC: 16.7 10*3/uL — ABNORMAL HIGH (ref 4.0–10.5)
nRBC: 0 % (ref 0.0–0.2)

## 2021-10-04 LAB — LACTIC ACID, PLASMA
Lactic Acid, Venous: 2.5 mmol/L (ref 0.5–1.9)
Lactic Acid, Venous: 3.5 mmol/L (ref 0.5–1.9)

## 2021-10-04 LAB — I-STAT VENOUS BLOOD GAS, ED
Acid-Base Excess: 3 mmol/L — ABNORMAL HIGH (ref 0.0–2.0)
Bicarbonate: 30.2 mmol/L — ABNORMAL HIGH (ref 20.0–28.0)
Calcium, Ion: 1.08 mmol/L — ABNORMAL LOW (ref 1.15–1.40)
HCT: 46 % (ref 39.0–52.0)
Hemoglobin: 15.6 g/dL (ref 13.0–17.0)
O2 Saturation: 54 %
Potassium: 3.8 mmol/L (ref 3.5–5.1)
Sodium: 137 mmol/L (ref 135–145)
TCO2: 32 mmol/L (ref 22–32)
pCO2, Ven: 53.6 mmHg (ref 44.0–60.0)
pH, Ven: 7.36 (ref 7.250–7.430)
pO2, Ven: 30 mmHg — CL (ref 32.0–45.0)

## 2021-10-04 LAB — RESP PANEL BY RT-PCR (FLU A&B, COVID) ARPGX2
Influenza A by PCR: NEGATIVE
Influenza B by PCR: NEGATIVE
SARS Coronavirus 2 by RT PCR: NEGATIVE

## 2021-10-04 MED ORDER — LOSARTAN POTASSIUM 50 MG PO TABS
50.0000 mg | ORAL_TABLET | Freq: Every day | ORAL | Status: DC
  Administered 2021-10-05 – 2021-10-06 (×2): 50 mg via ORAL
  Filled 2021-10-04 (×2): qty 1

## 2021-10-04 MED ORDER — LACTATED RINGERS IV SOLN: INTRAVENOUS | Status: AC

## 2021-10-04 MED ORDER — ADULT MULTIVITAMIN W/MINERALS CH
1.0000 | ORAL_TABLET | Freq: Every day | ORAL | Status: DC
  Administered 2021-10-04 – 2021-10-06 (×3): 1 via ORAL
  Filled 2021-10-04 (×3): qty 1

## 2021-10-04 MED ORDER — IPRATROPIUM-ALBUTEROL 0.5-2.5 (3) MG/3ML IN SOLN: 3.0000 mL | RESPIRATORY_TRACT | Status: DC | PRN

## 2021-10-04 MED ORDER — UMECLIDINIUM BROMIDE 62.5 MCG/ACT IN AEPB
1.0000 | INHALATION_SPRAY | Freq: Every day | RESPIRATORY_TRACT | Status: DC
  Administered 2021-10-05 – 2021-10-06 (×2): 1 via RESPIRATORY_TRACT
  Filled 2021-10-04: qty 7

## 2021-10-04 MED ORDER — NICOTINE 21 MG/24HR TD PT24
21.0000 mg | MEDICATED_PATCH | Freq: Every day | TRANSDERMAL | Status: DC
  Administered 2021-10-04 – 2021-10-06 (×3): 21 mg via TRANSDERMAL
  Filled 2021-10-04 (×3): qty 1

## 2021-10-04 MED ORDER — TIOTROPIUM BROMIDE MONOHYDRATE 2.5 MCG/ACT IN AERS: 2.0000 | INHALATION_SPRAY | Freq: Every day | RESPIRATORY_TRACT | Status: DC

## 2021-10-04 MED ORDER — THIAMINE HCL 100 MG PO TABS
100.0000 mg | ORAL_TABLET | Freq: Every day | ORAL | Status: DC
  Administered 2021-10-05 – 2021-10-06 (×2): 100 mg via ORAL
  Filled 2021-10-04 (×3): qty 1

## 2021-10-04 MED ORDER — THIAMINE HCL 100 MG/ML IJ SOLN
100.0000 mg | Freq: Every day | INTRAMUSCULAR | Status: DC
  Administered 2021-10-04: 23:00:00 100 mg via INTRAVENOUS
  Filled 2021-10-04: qty 2

## 2021-10-04 MED ORDER — ROSUVASTATIN CALCIUM 5 MG PO TABS
10.0000 mg | ORAL_TABLET | Freq: Every day | ORAL | Status: DC
  Administered 2021-10-05: 22:00:00 10 mg via ORAL
  Filled 2021-10-04: qty 2

## 2021-10-04 MED ORDER — IPRATROPIUM-ALBUTEROL 0.5-2.5 (3) MG/3ML IN SOLN
3.0000 mL | Freq: Once | RESPIRATORY_TRACT | Status: AC
  Administered 2021-10-04: 17:00:00 3 mL via RESPIRATORY_TRACT
  Filled 2021-10-04: qty 3

## 2021-10-04 MED ORDER — BUPROPION HCL ER (SR) 150 MG PO TB12
150.0000 mg | ORAL_TABLET | Freq: Two times a day (BID) | ORAL | Status: DC
  Administered 2021-10-05 – 2021-10-06 (×3): 150 mg via ORAL
  Filled 2021-10-04 (×5): qty 1

## 2021-10-04 MED ORDER — SODIUM CHLORIDE 0.9 % IV SOLN
2.0000 g | Freq: Three times a day (TID) | INTRAVENOUS | Status: DC
  Administered 2021-10-04 – 2021-10-05 (×2): 2 g via INTRAVENOUS
  Filled 2021-10-04 (×2): qty 2

## 2021-10-04 MED ORDER — FLUTICASONE FUROATE-VILANTEROL 200-25 MCG/ACT IN AEPB
1.0000 | INHALATION_SPRAY | Freq: Every day | RESPIRATORY_TRACT | Status: DC
  Administered 2021-10-05 – 2021-10-06 (×2): 1 via RESPIRATORY_TRACT
  Filled 2021-10-04: qty 28

## 2021-10-04 MED ORDER — LACTATED RINGERS IV BOLUS: 1000.0000 mL | Freq: Once | INTRAVENOUS | Status: DC

## 2021-10-04 MED ORDER — ENOXAPARIN SODIUM 80 MG/0.8ML IJ SOSY
70.0000 mg | PREFILLED_SYRINGE | INTRAMUSCULAR | Status: DC
  Administered 2021-10-04 – 2021-10-05 (×2): 70 mg via SUBCUTANEOUS
  Filled 2021-10-04 (×2): qty 0.7
  Filled 2021-10-04: qty 0.8

## 2021-10-04 MED ORDER — LACTATED RINGERS IV BOLUS
1000.0000 mL | Freq: Once | INTRAVENOUS | Status: AC
  Administered 2021-10-04: 23:00:00 1000 mL via INTRAVENOUS

## 2021-10-04 MED ORDER — HYDROCHLOROTHIAZIDE 25 MG PO TABS
25.0000 mg | ORAL_TABLET | Freq: Every day | ORAL | Status: DC
  Administered 2021-10-05 – 2021-10-06 (×2): 25 mg via ORAL
  Filled 2021-10-04 (×2): qty 1

## 2021-10-04 MED ORDER — FOLIC ACID 1 MG PO TABS
1.0000 mg | ORAL_TABLET | Freq: Every day | ORAL | Status: DC
  Administered 2021-10-04 – 2021-10-06 (×3): 1 mg via ORAL
  Filled 2021-10-04 (×3): qty 1

## 2021-10-04 MED ORDER — MAGNESIUM SULFATE 2 GM/50ML IV SOLN
2.0000 g | Freq: Once | INTRAVENOUS | Status: AC
  Administered 2021-10-04: 18:00:00 2 g via INTRAVENOUS
  Filled 2021-10-04: qty 50

## 2021-10-04 MED ORDER — PREDNISONE 20 MG PO TABS
40.0000 mg | ORAL_TABLET | Freq: Every day | ORAL | Status: DC
  Administered 2021-10-05 – 2021-10-06 (×2): 40 mg via ORAL
  Filled 2021-10-04 (×2): qty 2

## 2021-10-04 NOTE — H&P (Addendum)
Date: 10/04/2021               Patient Name:  Elijah Barton MRN: [id number]  DOB: 1955/11/13 Age / Sex: 65 y.o., male   PCP: Maximiano Coss, NP    Pulmonology:                Dr. Erin Fulling with [facility] Service: Internal Medicine Teaching Service         Attending Physician: Dr. 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  Patient states that he drank more alcohol than normal for Friday and Saturday and began to feel short of breath on Saturday night into Sunday.  He states that he was unable to lay down Saturday night due to dyspnea and this was not improved while using supplemental oxygen at 3 L.  He also notes that he has had a 50 lb weight gain in the last few months and his abdomen feels tight.  He continued to have shortness of breath and productive cough on Sunday and his girlfriend, Ms. Claiborne Billings, states that he had difficulty getting up to go to the bathroom which is unusual for him. This lead her to call an ambulance.    States that he frequently becomes short of breath whenever the weather changes. Has been treated with antibiotics 6 times this year due to COPD exacerbations, most recently on 12/06.  Currently smokes about 3 packs a day and uses supplemental oxygen at 3 L at night normally.    ED course: Patient was brought via EMS to the ED. Initial vitals in the field with oxygen saturations of 70% on room air.  Patient was  placed on nonrebreather and treated with DuoNebs, albuterol, and Solu-Medrol en route.  When he arrived to ED he was on nonrebreather with sats at 90%, heart rate at 140, blood pressure at 158/111.  He was given additional treatment with DuoNebs while in the ED and magnesium.  Leukocytosis at 16.7 with left shift, lactate at 2.5, and elevated bilirubin at 1.8.  VBG with mildly elevated bicarb of 30.2, no CO2 retention.  Chest x-ray with no signs concerning for pneumonia.  Respiratory panel and blood cultures pending.  Medications: No current facility-administered medications on file prior to encounter.   Current Outpatient Medications on File Prior to Encounter  Medication Sig Dispense Refill   albuterol (VENTOLIN HFA) 108 (90 Base) MCG/ACT inhaler Inhale 2 puffs into the lungs every 6 (six) hours as needed for wheezing or shortness of breath. 8 g 2   aspirin EC 81 MG tablet Take 1 tablet (81 mg total) by mouth daily. Swallow whole. 30 tablet 11   b complex vitamins tablet Take 1 tablet by mouth 3 (three) times a week.     budesonide-formoterol (SYMBICORT) 160-4.5 MCG/ACT inhaler Inhale 2 puffs into the lungs in the morning and at bedtime. 1 each 12   buPROPion (WELLBUTRIN SR) 150 MG  12 hr tablet Take 1 tablet (150 mg total) by mouth 2 (two) times daily. 180 tablet 0   cholecalciferol (VITAMIN D3) 25 MCG (1000 UNIT) tablet Take 1,000 Units by mouth daily.     Coenzyme Q10 (CO Q 10 PO) Take 1 tablet by mouth daily.     cyclobenzaprine (FLEXERIL) 10 MG tablet Take 1 tablet (10 mg total) by mouth 3 (three) times daily as needed for muscle spasms. 90 tablet 1   doxycycline (VIBRA-TABS) 100 MG tablet Take 1 tablet (100 mg total) by mouth 2 (two) times daily. 14 tablet 0   fluticasone (FLONASE) 50 MCG/ACT nasal spray Place 2 sprays into both nostrils daily. 16 g 6   furosemide (LASIX) 20 MG tablet Take 2 tablets (40 mg total) by mouth daily. 30 tablet 3   gabapentin (NEURONTIN) 300 MG capsule Take 1  capsule (300 mg total) by mouth every morning AND 1 capsule (300 mg total) daily at 12 noon AND 2 capsules (600 mg total) at bedtime. 270 capsule 1   hydrochlorothiazide (HYDRODIURIL) 25 MG tablet Take 1 tablet (25 mg total) by mouth daily. 90 tablet 3   losartan (COZAAR) 50 MG tablet Take 1 tablet (50 mg total) by mouth daily. 90 tablet 3   naproxen (NAPROSYN) 500 MG tablet Take 1 tablet (500 mg total) by mouth 2 (two) times daily. 90 tablet 1   rosuvastatin (CRESTOR) 10 MG tablet Take 1 tablet (10 mg total) by mouth at bedtime. 90 tablet 0   Tiotropium Bromide Monohydrate (SPIRIVA RESPIMAT) 2.5 MCG/ACT AERS Inhale 2 puffs into the lungs daily. 8 g 3   TURMERIC PO Take 1 tablet by mouth 2 (two) times daily.      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 Level of function -able to complete ADLs independently PCP -Maximiano Coss, NP Substance use -he reports drinking 375 mL of vodka daily and has done since he was in the [place].  He states that he has stopped drinking in the past for 3 years and is interested in talking with someone to get more support to work towards this again.  Current smoker about 3 packs a day.  Occasionally smokes marijuana. No suicidal or homicidal ideation  Family History: Family History  Problem Relation Age of Onset   Colon cancer Mother    Heart disease Mother    Stroke Mother    Cancer Mother        lung   Alcohol abuse Mother    Arthritis  Mother    Asthma Mother    COPD Mother    Depression Mother    Cirrhosis Father    Alcohol abuse Father    Alcohol abuse Brother     Allergies: Allergies as of 10/04/2021 - Review Complete 10/04/2021  Allergen Reaction Noted   Penicillins Other (See Comments) 05/10/2011  Review of Systems: A complete ROS was negative except as per HPI.   OBJECTIVE:  Physical Exam: Blood pressure 139/72, pulse 98, temperature 99 F (37.2 C), temperature source Oral, resp. rate 20, height 6\' 2"  (1.88 m), weight (!) 147.4 kg, SpO2 93 %. Gen: chronically ill-appearing man who is sitting on edge of bed to help with breathing, in no acute distress HENT: NCAT, wears glasses Eyes: No scleral icterus, conjunctiva clear Heart: Regular rate and rhythm, no murmurs Lungs: crackles present in lower lobes bilaterally, leaning forward in bed, no increased work of breathing or accessory muscle use Abd: full, distended, nontender MSK: Hemosiderin staining present to lower extremities bilaterally, nonpitting edema bilaterally, indention to left shin secondary to machinery accident several years ago, puncture wound on right shin without surrounding warmth or tenderness, spider angiomata Skin:warm and dry, scaling present on feet bilaterally, toenails elongated Psych: Alert and oriented x3, depressed affect  Pertinent Labs: CBC    Component Value Date/Time   WBC 16.7 (H) 10/04/2021 1714   RBC 4.26 10/04/2021 1714   HGB 15.6 10/04/2021 1721   HCT 46.0 10/04/2021 1721   PLT 163 10/04/2021 1714   MCV 106.1 (H) 10/04/2021 1714   MCV 99.0 (A) 05/04/2018 1151   MCH 35.7 (H) 10/04/2021 1714   MCHC 33.6 10/04/2021 1714   RDW 13.3 10/04/2021 1714   LYMPHSABS 1.3 10/04/2021 1714   MONOABS 1.0 10/04/2021 1714   EOSABS 0.0 10/04/2021 1714   BASOSABS 0.0 10/04/2021 1714     CMP     Component Value Date/Time   NA 137 10/04/2021 1721   NA 140 09/16/2017 1045   K 3.8 10/04/2021 1721   CL 99 10/04/2021 1714    CO2 29 10/04/2021 1714   GLUCOSE 132 (H) 10/04/2021 1714   BUN 6 (L) 10/04/2021 1714   BUN 5 (L) 09/16/2017 1045   CREATININE 1.01 10/04/2021 1714   CREATININE 0.87 10/21/2014 1626   CALCIUM 8.8 (L) 10/04/2021 1714   PROT 7.3 10/04/2021 1714   PROT 7.1 09/16/2017 1045   ALBUMIN 3.9 10/04/2021 1714   ALBUMIN 4.3 09/16/2017 1045   AST 31 10/04/2021 1714   ALT 26 10/04/2021 1714   ALKPHOS 62 10/04/2021 1714   BILITOT 1.8 (H) 10/04/2021 1714   BILITOT 1.0 09/16/2017 1045   GFRNONAA >60 10/04/2021 1714   GFRAA >60 07/15/2018 1614    Pertinent Imaging: DG Chest 2 View  Result Date: 10/04/2021 CLINICAL DATA:  Respiratory distress. EXAM: CHEST - 2 VIEW COMPARISON:  Chest radiograph dated 10/04/2021. FINDINGS: No focal consolidation, pleural effusion, or pneumothorax. The cardiac silhouette is within limits. No acute osseous pathology. IMPRESSION: No active cardiopulmonary disease. Electronically Signed   By: Anner Crete M.D.   On: 10/04/2021 19:54   DG Chest Portable 1 View  Result Date: 10/04/2021 CLINICAL DATA:  SHOB EXAM: PORTABLE CHEST 1 VIEW COMPARISON:  Chest x-ray 01/26/2021 FINDINGS: The heart and mediastinal contours are within normal limits. Aortic calcification. Airspace opacity along the left costophrenic angle likely related to pericardial fat. No focal consolidation. No pulmonary edema. No pleural effusion. No pneumothorax. No acute osseous abnormality. IMPRESSION: Airspace opacity along the left costophrenic angle likely related to pericardial fat. Recommend PA and lateral view of the chest for further evaluation. Electronically Signed   By: Iven Finn M.D.   On: 10/04/2021 18:20    EKG: personally reviewed my interpretation is sinus tachycardia, poor R wave progression  ASSESSMENT & PLAN:  Assessment: Principal Problem:   COPD with  acute exacerbation ([place])   Zeven Kocak is a 65 y.o. with pertinent PMH of COPD on 3L supplemental O2 at night, tobacco use  disorder, alcohol abuse disorder,cirrhosis, congestive heart failure, HTN, and HLD who presented with shortness of breath with hypoxia and admit for COPD with acute exacerbation and further valuation of cirrhosis and congestive heart failure on hospital day 0  Plan: #COPD with acute exacerbation #Acute respiratory failure #Hyperlactatemia He reports increased dyspnea, cough, and sputum production. Lactic acid at 2.5 increased to 3.9. He has had 6 COPD exacerbations in the past year and was treated with antibiotics each time.  He was recently ill and finished a course of doxycycline for URI on December 14.  X-ray with no focal consolidation or concern for pneumonia. CT chest in May 2022 showed mild centrilobular emphysema with diffuse bronchial wall thickening.  He does not have pulmonary function test on chart review. In setting of structural lung disease and recent antibiotic exposure we will start patient on Pseudomonas coverage for severe COPD exacerbation with cefepime as well as Long acting muscarinic antagonist, long acting beta agonist, systemic steroids, and SABA.  -Cefepime 2 g -Umeclidinium bromide 1 puff daily -Fluticasone 1 puff daily -DuoNebs every 4 hours as needed -CBC -CMP -Repeat lactic acid -Follow-up on blood culture -F/u procalcitonin  -Continue IVF    #Ascites #Suspected Cirrhosis #Hyperbilirubinemia Bilirubin elevated at 1.8. Previous RUQ US showed done 04/22 with fatty liver. MELD NA 15 and Child Pugh Class A. INR mildly elevated at 1.4.  Will order [place] to further assess for ascites.  Ascites is likely also contributing to his difficulty breathing.  -[place] ascites -Repeat CMP  #Congestive heart failure Patient's was diagnosed with congestive heart failure and was being further evaluated by cardiology with plans for echo and nuclear stress test outpatient.  He has been taking Lasix 40 mg daily and notes that swelling in lower extremities has improved with this  medication. BNP elevated at 130.7, but with patient's BMI likely higher.  -Hold home medication of lasix 40 mg qd -TTE  #Tobacco use disorder Patient would like to decrease amount that he is smoking.  He is currently on Wellbutrin and he has tried nicotine patches with nicotine gum in the past.  He currently still smokes about 3 packs/day.  He started smoking in 1976.  -Continue home medication of wellbutrin 150 mg BID -nicotine patch 21 mg -Transition of care team for smoking cessation counseling   #Alcohol use disorder #Depression He states that he has been a heavy drinker since his time in the [place].  He currently drinks about 375 mL (a pint) of vodka or FireBall every evening.  On Friday and Saturday, he increased this amount to 750 mL due to recent events in his life and wanting to feel numb.  He is interested in decreasing amount that he drinks and has stopped in the past.  Was referred by pulmonologist to psychiatrist recently.   -CiWa protocol -folic acid -thiamine -multi vitamin -cardiac monitoring -transition of care team for substance abuse counseling/education  #Hypertension Home medications include losartan 50 mg and hydrochlorothiazide 25 mg.  -Continue home medications of losartan 50 mg and HCTZ 25 mg  #Hyperlipidemia Medications include rosuvastatin 10 mg.  Last lipid panel 8 months ago with LDL at goal for primary prevention <100 at 89.    - Continue home medications of rosuvastatin 10 mg daily --Holding home medication of aspirin 81 mg.  Patient does not have prior history of stroke, CAD,  or PAD.  #Chronic venous stasis Stasis dermatitis present on exam with scaling.  Best Practice: Diet: Cardiac diet IVF: Fluids: none, Rate: None VTE:   Lovenox Code: Full AB: Cefepime  Status: Observation with expected length of stay less than 2 midnights. Anticipated Discharge Location: Home Barriers to Discharge: Medical stability  Signature: Daleen Bo. Masters,  D.O.  Internal Medicine Resident, PGY-1 [facility]  Pager: [phone] 10:44 PM, 10/04/2021   Please contact the on call pager after 5 pm and on weekends at [phone].

## 2021-10-04 NOTE — H&P (Incomplete)
Date: 10/04/2021               Patient Name:  Elijah Barton MRN: [id number]  DOB: 05/24/1956 Age / Sex: 65 y.o., male   PCP: Maximiano Coss, NP         Medical Service: Internal Medicine Teaching Service         Attending Physician: Dr. Truddie Hidden, MD    First Contact: Corky Sox Pager: [phone]  Second Contact: Virl Axe, MD Pager: Sandrea Hammond [phone]       After Hours (After 5p/  First Contact Pager: [phone]  weekends / holidays): Second Contact Pager: [phone]   SUBJECTIVE  Chief Complaint: shortness of breath  History of Present Illness: Elijah Barton is a 65 y.o. male with a pertinent PMH of tobacco use, who presents to [facility] with ***.  ***   In the ED, the patient was ***  Medications: No current facility-administered medications on file prior to encounter.   Current Outpatient Medications on File Prior to Encounter  Medication Sig Dispense Refill   albuterol (VENTOLIN HFA) 108 (90 Base) MCG/ACT inhaler Inhale 2 puffs into the lungs every 6 (six) hours as needed for wheezing or shortness of breath. 8 g 2   aspirin EC 81 MG tablet Take 1 tablet (81 mg total) by mouth daily. Swallow whole. 30 tablet 11   b complex vitamins tablet Take 1 tablet by mouth 3 (three) times a week.     budesonide-formoterol (SYMBICORT) 160-4.5 MCG/ACT inhaler Inhale 2 puffs into the lungs in the morning and at bedtime. 1 each 12   buPROPion (WELLBUTRIN SR) 150 MG 12 hr tablet Take 1 tablet (150 mg total) by mouth 2 (two) times daily. 180 tablet 0   cholecalciferol (VITAMIN D3) 25 MCG (1000 UNIT) tablet Take 1,000 Units by mouth daily.     Coenzyme Q10 (CO Q 10 PO) Take 1 tablet by mouth daily.     cyclobenzaprine (FLEXERIL) 10 MG tablet Take 1 tablet (10 mg total) by mouth 3 (three) times daily as needed for muscle spasms. 90 tablet 1   doxycycline (VIBRA-TABS) 100 MG tablet Take 1 tablet (100 mg total) by mouth 2 (two) times daily. 14 tablet 0   fluticasone (FLONASE) 50 MCG/ACT nasal  spray Place 2 sprays into both nostrils daily. 16 g 6   furosemide (LASIX) 20 MG tablet Take 2 tablets (40 mg total) by mouth daily. 30 tablet 3   gabapentin (NEURONTIN) 300 MG capsule Take 1 capsule (300 mg total) by mouth every morning AND 1 capsule (300 mg total) daily at 12 noon AND 2 capsules (600 mg total) at bedtime. 270 capsule 1   hydrochlorothiazide (HYDRODIURIL) 25 MG tablet Take 1 tablet (25 mg total) by mouth daily. 90 tablet 3   losartan (COZAAR) 50 MG tablet Take 1 tablet (50 mg total) by mouth daily. 90 tablet 3   naproxen (NAPROSYN) 500 MG tablet Take 1 tablet (500 mg total) by mouth 2 (two) times daily. 90 tablet 1   rosuvastatin (CRESTOR) 10 MG tablet Take 1 tablet (10 mg total) by mouth at bedtime. 90 tablet 0   Tiotropium Bromide Monohydrate (SPIRIVA RESPIMAT) 2.5 MCG/ACT AERS Inhale 2 puffs into the lungs daily. 8 g 3   TURMERIC PO Take 1 tablet by mouth 2 (two) times daily.      Past Medical History:  Past Medical History:  Diagnosis Date   Alcohol abuse    Allergies    Arthritis    Back  pain    Cataracts, bilateral    COPD (chronic obstructive pulmonary disease) (HCC)    Fatty liver    Glaucoma    History of depression    Hypertension    Joint pain    all over except for hip   Peripheral neuropathy    Substance abuse ([place])     Social:  Lives - *** Occupation - *** Support - *** Level of function - *** PCP - *** Substance use - ***  Family History: Family History  Problem Relation Age of Onset   Colon cancer Mother    Heart disease Mother    Stroke Mother    Cancer Mother        lung   Alcohol abuse Mother    Arthritis Mother    Asthma Mother    COPD Mother    Depression Mother    Cirrhosis Father    Alcohol abuse Father    Alcohol abuse Brother     Allergies: Allergies as of 10/04/2021 - Review Complete 10/04/2021  Allergen Reaction Noted   Penicillins Other (See Comments) 05/10/2011    Review of Systems: A complete ROS was  negative except as per HPI.   OBJECTIVE:  Physical Exam: Blood pressure (!) 149/102, pulse (!) 124, temperature 99 F (37.2 C), temperature source Oral, resp. rate 18, height 6\' 2"  (1.88 m), weight (!) 147.4 kg, SpO2 91 %. Physical Exam  Pertinent Labs: CBC    Component Value Date/Time   WBC 16.7 (H) 10/04/2021 1714   RBC 4.26 10/04/2021 1714   HGB 15.6 10/04/2021 1721   HCT 46.0 10/04/2021 1721   PLT 163 10/04/2021 1714   MCV 106.1 (H) 10/04/2021 1714   MCV 99.0 (A) 05/04/2018 1151   MCH 35.7 (H) 10/04/2021 1714   MCHC 33.6 10/04/2021 1714   RDW 13.3 10/04/2021 1714   LYMPHSABS 1.3 10/04/2021 1714   MONOABS 1.0 10/04/2021 1714   EOSABS 0.0 10/04/2021 1714   BASOSABS 0.0 10/04/2021 1714     CMP     Component Value Date/Time   NA 137 10/04/2021 1721   NA 140 09/16/2017 1045   K 3.8 10/04/2021 1721   CL 99 10/04/2021 1714   CO2 29 10/04/2021 1714   GLUCOSE 132 (H) 10/04/2021 1714   BUN 6 (L) 10/04/2021 1714   BUN 5 (L) 09/16/2017 1045   CREATININE 1.01 10/04/2021 1714   CREATININE 0.87 10/21/2014 1626   CALCIUM 8.8 (L) 10/04/2021 1714   PROT 7.3 10/04/2021 1714   PROT 7.1 09/16/2017 1045   ALBUMIN 3.9 10/04/2021 1714   ALBUMIN 4.3 09/16/2017 1045   AST 31 10/04/2021 1714   ALT 26 10/04/2021 1714   ALKPHOS 62 10/04/2021 1714   BILITOT 1.8 (H) 10/04/2021 1714   BILITOT 1.0 09/16/2017 1045   GFRNONAA >60 10/04/2021 1714   GFRAA >60 07/15/2018 1614    Pertinent Imaging: DG Chest Portable 1 View  Result Date: 10/04/2021 CLINICAL DATA:  SHOB EXAM: PORTABLE CHEST 1 VIEW COMPARISON:  Chest x-ray 01/26/2021 FINDINGS: The heart and mediastinal contours are within normal limits. Aortic calcification. Airspace opacity along the left costophrenic angle likely related to pericardial fat. No focal consolidation. No pulmonary edema. No pleural effusion. No pneumothorax. No acute osseous abnormality. IMPRESSION: Airspace opacity along the left costophrenic angle likely related  to pericardial fat. Recommend PA and lateral view of the chest for further evaluation. Electronically Signed   By: Iven Finn M.D.   On: 10/04/2021 18:20  EKG: personally reviewed my interpretation is {ekg findings:315101}  ASSESSMENT & PLAN:  Assessment: Active Problems:   * No active hospital problems. *   Elijah Barton is a 65 y.o. with pertinent PMH of *** who presented with *** and admit for *** on hospital day 0  Plan: #*** ***  #*** ***  #*** ***  #*** ***  #*** ***  #*** ***  #*** ***  Best Practice: Diet: {CHL DISCHARGE DIET:21201} IVF: Fluids: {Meds; iv fluids:31617}, Rate: {NAMES:3044014::"None","*** cc/hr x *** hrs","*** cc bolus"} VTE:  Code: {NAMES:3044014::"Full","DNR","DNI","DNR/DNI","Comfort Care","Unknown"} AB: *** Status: {STATUS:3044014::"Observation with expected length of stay less than 2 midnights.","Inpatient with expected length of stay greater than 2 midnights."} Anticipated Discharge Location: {NAMES:3044014::"Home","SNF","CIR","***"} Barriers to Discharge: {BARRIERS TO HGDJMEQAS:34196}  Signature: Daleen Bo. Kendalynn Wideman, D.O.  Internal Medicine Resident, PGY-1 [facility]  Pager: [phone] 7:32 PM, 10/04/2021   Please contact the on call pager after 5 pm and on weekends at [phone].

## 2021-10-04 NOTE — ED Provider Notes (Signed)
[facility] EMERGENCY DEPARTMENT Provider Note   CSN: [id number] Arrival date & time: 10/04/21  1705     History No chief complaint on file.   Elijah Barton is a 65 y.o. male with a past medical history significant for alcohol abuse, COPD, hypertension who presents with worsening shortness of breath over the last several days.  Patient is on 3 L nasal cannula baseline but called EMS and was not wearing, reports that he was not taking any of his medications.  Patient found to be 70% oxygen saturation on room air.  Patient arrives via EMS after being placed on nonrebreather at 15 L, administering DuoNeb, albuterol, Solu-Medrol.  Patient reports he smokes 3 packs of cigarettes a day.  Patient denies any pain on arrival, endorsing significant shortness of breath.  Patient reports that he had recent injury to his leg secondary to his cat, but he is not sure how long it has been red for.  HPI     Past Medical History:  Diagnosis Date   Alcohol abuse    Allergies    Arthritis    Back pain    Cataracts, bilateral    COPD (chronic obstructive pulmonary disease) (HCC)    Fatty liver    Glaucoma    History of depression    Hypertension    Joint pain    all over except for hip   Peripheral neuropathy    Substance abuse [facility])     Patient Active Problem List   Diagnosis Date Noted   Chronic bronchitis ([place]) 07/22/2020   Back spasm 11/27/2018   Hypoxia 09/19/2017   Notalgia 11/16/2015   Alcohol abuse, daily use 10/12/2014   COPD exacerbation ([place]) 09/02/2013   BMI 37.0-37.9, adult 09/02/2013   Nicotine addiction 09/02/2013   Peripheral neuropathy 03/12/2013    Past Surgical History:  Procedure Laterality Date   LYMPH NODE BIOPSY  1970   neck   TONSILLECTOMY AND ADENOIDECTOMY  1968       Family History  Problem Relation Age of Onset   Colon cancer Mother    Heart disease Mother    Stroke Mother    Cancer Mother        lung   Alcohol abuse Mother     Arthritis Mother    Asthma Mother    COPD Mother    Depression Mother    Cirrhosis Father    Alcohol abuse Father    Alcohol abuse Brother     Social History   Tobacco Use   Smoking status: Every Day    Packs/day: 3.00    Years: 40.00    Pack years: 120.00    Types: Cigarettes   Smokeless tobacco: Never  Vaping Use   Vaping Use: Former  Substance Use Topics   Alcohol use: Yes    Alcohol/week: 15.0 standard drinks    Types: 15 Standard drinks or equivalent per week    Comment: 0-20 drinks/week. Pint of Liquor day    Drug use: Yes    Types: Marijuana    Home Medications Prior to Admission medications   Medication Sig Start Date End Date Taking? Authorizing Provider  albuterol (VENTOLIN HFA) 108 (90 Base) MCG/ACT inhaler Inhale 2 puffs into the lungs every 6 (six) hours as needed for wheezing or shortness of breath. 12/29/20   Midge Minium, MD  aspirin EC 81 MG tablet Take 1 tablet (81 mg total) by mouth daily. Swallow whole. 08/20/21   Rex Kras, DO  b complex vitamins tablet Take 1 tablet by mouth 3 (three) times a week.    [provider]  budesonide-formoterol (SYMBICORT) 160-4.5 MCG/ACT inhaler Inhale 2 puffs into the lungs in the morning and at bedtime. 07/22/20   [name], PA-C  buPROPion (WELLBUTRIN SR) 150 MG 12 hr tablet Take 1 tablet (150 mg total) by mouth 2 (two) times daily. 01/26/21   Maximiano Coss, NP  cholecalciferol (VITAMIN D3) 25 MCG (1000 UNIT) tablet Take 1,000 Units by mouth daily.    [provider]  Coenzyme Q10 (CO Q 10 PO) Take 1 tablet by mouth daily.    [provider]  cyclobenzaprine (FLEXERIL) 10 MG tablet Take 1 tablet (10 mg total) by mouth 3 (three) times daily as needed for muscle spasms. 06/19/20   [name], PA-C  doxycycline (VIBRA-TABS) 100 MG tablet Take 1 tablet (100 mg total) by mouth 2 (two) times daily. 09/21/21   Maximiano Coss, NP  fluticasone (FLONASE) 50 MCG/ACT nasal spray  Place 2 sprays into both nostrils daily. 02/23/21   Maximiano Coss, NP  furosemide (LASIX) 20 MG tablet Take 2 tablets (40 mg total) by mouth daily. 02/23/21   Maximiano Coss, NP  gabapentin (NEURONTIN) 300 MG capsule Take 1 capsule (300 mg total) by mouth every morning AND 1 capsule (300 mg total) daily at 12 noon AND 2 capsules (600 mg total) at bedtime. 07/14/21   Maximiano Coss, NP  hydrochlorothiazide (HYDRODIURIL) 25 MG tablet Take 1 tablet (25 mg total) by mouth daily. 06/03/20   [name], PA-C  losartan (COZAAR) 50 MG tablet Take 1 tablet (50 mg total) by mouth daily. 06/19/20   [name], PA-C  naproxen (NAPROSYN) 500 MG tablet Take 1 tablet (500 mg total) by mouth 2 (two) times daily. 07/14/21   Maximiano Coss, NP  rosuvastatin (CRESTOR) 10 MG tablet Take 1 tablet (10 mg total) by mouth at bedtime. 08/20/21 11/18/21  Tolia, Sunit, DO  Tiotropium Bromide Monohydrate (SPIRIVA RESPIMAT) 2.5 MCG/ACT AERS Inhale 2 puffs into the lungs daily. 07/14/21   Maximiano Coss, NP  TURMERIC PO Take 1 tablet by mouth 2 (two) times daily.    [provider]    Allergies    Penicillins  Review of Systems   Review of Systems  Respiratory:  Positive for chest tightness and shortness of breath.   All other systems reviewed and are negative.  Physical Exam Updated Vital Signs BP (!) 134/98 (BP Location: Right Arm)    Pulse (!) 140    Temp 99 F (37.2 C) (Oral)    Resp (!) 26    Ht 6\' 2"  (1.88 m)    Wt (!) 147.4 kg    SpO2 96%    BMI 41.73 kg/m   Physical Exam Vitals and nursing note reviewed.  Constitutional:      General: He is in acute distress.     Appearance: Normal appearance. He is obese. He is ill-appearing.  HENT:     Head: Normocephalic and atraumatic.  Eyes:     General:        Right eye: No discharge.        Left eye: No discharge.  Cardiovascular:     Rate and Rhythm: Regular rhythm. Tachycardia present.     Heart sounds: No murmur heard.   No friction rub.  No gallop.  Pulmonary:     Effort: Respiratory distress present.     Breath sounds: Wheezing and rhonchi present.  Comments: Significant tachypnea, respiratory distress, rhonchi throughout both lung fields. Abdominal:     General: Bowel sounds are normal. There is distension.     Palpations: Abdomen is soft.  Skin:    General: Skin is warm and dry.     Capillary Refill: Capillary refill takes less than 2 seconds.     Comments: Erythema, edema of bilateral legs, right worse than left, no ulcers or cystic lesions noted  Neurological:     Mental Status: He is alert and oriented to person, place, and time.  Psychiatric:        Mood and Affect: Mood normal.        Behavior: Behavior normal.    ED Results / Procedures / Treatments   Labs (all labs ordered are listed, but only abnormal results are displayed) Labs Reviewed  CBC WITH DIFFERENTIAL/PLATELET - Abnormal; Notable for the following components:      Result Value   WBC 16.7 (*)    MCV 106.1 (*)    MCH 35.7 (*)    Neutro Abs 14.2 (*)    Abs Immature Granulocytes 0.09 (*)    All other components within normal limits  I-STAT VENOUS BLOOD GAS, ED - Abnormal; Notable for the following components:   pO2, Ven 30.0 (*)    Bicarbonate 30.2 (*)    Acid-Base Excess 3.0 (*)    Calcium, Ion 1.08 (*)    All other components within normal limits  CULTURE, BLOOD (ROUTINE X 2)  CULTURE, BLOOD (ROUTINE X 2)  RESP PANEL BY RT-PCR (FLU A&B, COVID) [id number]  COMPREHENSIVE METABOLIC PANEL  LACTIC ACID, PLASMA  LACTIC ACID, PLASMA    EKG EKG Interpretation  Date/Time:  Monday October 04 2021 17:13:26 EST Ventricular Rate:  137 PR Interval:    QRS Duration: 84 QT Interval:  259 QTC Calculation: 391 R Axis:   250 Text Interpretation: Sinus tachycardia Left anterior fascicular block Probable right ventricular hypertrophy Borderline T abnormalities, anterior leads Since last tracing Rate faster Confirmed by Calvert Cantor [phone]) on  10/04/2021 5:15:16 PM  Radiology No results found.  Procedures .Critical Care Performed by: Anselmo Pickler, PA-C Authorized by: Anselmo Pickler, PA-C   Critical care provider statement:    Critical care time (minutes):  6   Critical care time was exclusive of:  Separately billable procedures and treating other patients   Critical care was necessary to treat or prevent imminent or life-threatening deterioration of the following conditions:  Respiratory failure   Critical care was time spent personally by me on the following activities:  Development of treatment plan with patient or surrogate, discussions with consultants, evaluation of patient's response to treatment, examination of patient, ordering and review of laboratory studies, ordering and review of radiographic studies, ordering and performing treatments and interventions, pulse oximetry, re-evaluation of patient's condition and review of old charts   Care discussed with: admitting provider     Medications Ordered in ED Medications  magnesium sulfate IVPB 2 g 50 mL (2 g Intravenous New Bag/Given 10/04/21 1735)  ipratropium-albuterol (DUONEB) 0.5-2.5 (3) MG/3ML nebulizer solution 3 mL (3 mLs Nebulization Given 10/04/21 1727)    ED Course  I have reviewed the triage vital signs and the nursing notes.  Pertinent labs & imaging results that were available during my care of the patient were reviewed by me and considered in my medical decision making (see chart for details).  Clinical Course as of 10/04/21 1815  Mon Oct 04, 2021  1752 Able to  decrease to 6L nasal cannula after second duoneb, can't go lower than this [CP]    Clinical Course User Index [CP] Zygmunt Mcglinn, Jackalyn Lombard   MDM Rules/Calculators/A&P                         I discussed this case with my attending physician who cosigned this note including patient's presenting symptoms, physical exam, and planned diagnostics and interventions. Attending  physician stated agreement with plan or made changes to plan which were implemented.   Attending physician assessed patient at bedside.  Overall ill-appearing patient comes in on 15 L nonrebreather.  Rhonchorous lung sounds, diffuse wheezing.  Patient able to decrease to 6 L nasal cannula after administration of DuoNeb for the second time, as well as magnesium.  Chest x-ray clear at this time, without focal pneumonia concern for overall COPD exacerbation.  Patient with significant risk factors including medication nonadherence, heavy tobacco use, recent illness.  Patient with borderline fever, believe that this may be slightly more elevated given that it was taken orally.  Will obtain lactic acid, blood cultures.  Initial lab work significant for elevated white blood count with neutrophil predominance of 16.7.  Venous blood gas is without frank acidosis, however PO2 is decreased, bicarb is elevated. Mildly elevated blood glucose. Presumed diagnosis of COPD exacerbation without pneumonia at this time.  Patient will need admission for further evaluation in context of acute hypoxic respiratory failure.  Admission consulted at this time. Hospitalist agrees to admission at this time. Final Clinical Impression(s) / ED Diagnoses Final diagnoses:  None    Rx / DC Orders ED Discharge Orders     None        Dorien Chihuahua 10/04/21 1923    Truddie Hidden, MD 10/04/21 2237

## 2021-10-04 NOTE — ED Triage Notes (Signed)
Pt bib [facility] from home with complaints of increased shob since this am. Pt on 3LNC baseline but was not wearing it on EMS arrival. Pt found to be 70%RA. Pt placed on NRB with duoneb, 5mg  Albuterol, 125mg  Solumedrol given en route. Pt arrives on NRB with sats at 90%. Pt does have COPD and smokes 3 packs of cigarettes a day.  EMS vitals: 140HR, 158/111

## 2021-10-05 ENCOUNTER — Encounter (HOSPITAL_COMMUNITY): Payer: Self-pay | Admitting: Internal Medicine

## 2021-10-05 ENCOUNTER — Observation Stay (HOSPITAL_BASED_OUTPATIENT_CLINIC_OR_DEPARTMENT_OTHER): Payer: Medicare Other

## 2021-10-05 ENCOUNTER — Observation Stay (HOSPITAL_COMMUNITY): Payer: Medicare Other

## 2021-10-05 DIAGNOSIS — Z825 Family history of asthma and other chronic lower respiratory diseases: Secondary | ICD-10-CM | POA: Diagnosis not present

## 2021-10-05 DIAGNOSIS — I509 Heart failure, unspecified: Secondary | ICD-10-CM | POA: Diagnosis present

## 2021-10-05 DIAGNOSIS — Z811 Family history of alcohol abuse and dependence: Secondary | ICD-10-CM | POA: Diagnosis not present

## 2021-10-05 DIAGNOSIS — Z20822 Contact with and (suspected) exposure to covid-19: Secondary | ICD-10-CM | POA: Diagnosis present

## 2021-10-05 DIAGNOSIS — E785 Hyperlipidemia, unspecified: Secondary | ICD-10-CM | POA: Diagnosis present

## 2021-10-05 DIAGNOSIS — F1010 Alcohol abuse, uncomplicated: Secondary | ICD-10-CM | POA: Diagnosis present

## 2021-10-05 DIAGNOSIS — K7460 Unspecified cirrhosis of liver: Secondary | ICD-10-CM | POA: Diagnosis present

## 2021-10-05 DIAGNOSIS — F32A Depression, unspecified: Secondary | ICD-10-CM | POA: Diagnosis present

## 2021-10-05 DIAGNOSIS — E872 Acidosis, unspecified: Secondary | ICD-10-CM | POA: Diagnosis present

## 2021-10-05 DIAGNOSIS — Z8249 Family history of ischemic heart disease and other diseases of the circulatory system: Secondary | ICD-10-CM | POA: Diagnosis not present

## 2021-10-05 DIAGNOSIS — Z7951 Long term (current) use of inhaled steroids: Secondary | ICD-10-CM | POA: Diagnosis not present

## 2021-10-05 DIAGNOSIS — J441 Chronic obstructive pulmonary disease with (acute) exacerbation: Principal | ICD-10-CM

## 2021-10-05 DIAGNOSIS — Z7982 Long term (current) use of aspirin: Secondary | ICD-10-CM | POA: Diagnosis not present

## 2021-10-05 DIAGNOSIS — K760 Fatty (change of) liver, not elsewhere classified: Secondary | ICD-10-CM | POA: Diagnosis present

## 2021-10-05 DIAGNOSIS — Z79899 Other long term (current) drug therapy: Secondary | ICD-10-CM | POA: Diagnosis not present

## 2021-10-05 DIAGNOSIS — D72829 Elevated white blood cell count, unspecified: Secondary | ICD-10-CM | POA: Diagnosis present

## 2021-10-05 DIAGNOSIS — F17210 Nicotine dependence, cigarettes, uncomplicated: Secondary | ICD-10-CM | POA: Diagnosis present

## 2021-10-05 DIAGNOSIS — Z818 Family history of other mental and behavioral disorders: Secondary | ICD-10-CM | POA: Diagnosis not present

## 2021-10-05 DIAGNOSIS — I110 Hypertensive heart disease with heart failure: Secondary | ICD-10-CM | POA: Diagnosis present

## 2021-10-05 DIAGNOSIS — R0609 Other forms of dyspnea: Secondary | ICD-10-CM | POA: Diagnosis not present

## 2021-10-05 DIAGNOSIS — Z880 Allergy status to penicillin: Secondary | ICD-10-CM | POA: Diagnosis not present

## 2021-10-05 DIAGNOSIS — J9621 Acute and chronic respiratory failure with hypoxia: Secondary | ICD-10-CM | POA: Diagnosis present

## 2021-10-05 LAB — COMPREHENSIVE METABOLIC PANEL
ALT: 24 U/L (ref 0–44)
AST: 33 U/L (ref 15–41)
Albumin: 3.6 g/dL (ref 3.5–5.0)
Alkaline Phosphatase: 53 U/L (ref 38–126)
Anion gap: 8 (ref 5–15)
BUN: 11 mg/dL (ref 8–23)
CO2: 27 mmol/L (ref 22–32)
Calcium: 8.8 mg/dL — ABNORMAL LOW (ref 8.9–10.3)
Chloride: 100 mmol/L (ref 98–111)
Creatinine, Ser: 1.03 mg/dL (ref 0.61–1.24)
GFR, Estimated: >60 mL/min (ref >60)
Glucose, Bld: 187 mg/dL — ABNORMAL HIGH (ref 70–99)
Potassium: 4.4 mmol/L (ref 3.5–5.1)
Sodium: 135 mmol/L (ref 135–145)
Total Bilirubin: 1.1 mg/dL (ref 0.3–1.2)
Total Protein: 6.9 g/dL (ref 6.5–8.1)

## 2021-10-05 LAB — ECHOCARDIOGRAM COMPLETE
AR max vel: 2.95 cm2
AV Peak grad: 11.2 mmHg
Ao pk vel: 1.67 m/s
Area-P 1/2: 3.65 cm2
Calc EF: 63.9 %
Height: 74 in
Single Plane A2C EF: 63.5 %
Single Plane A4C EF: 64.4 %
Weight: 5200 oz

## 2021-10-05 LAB — CBC
HCT: 43.6 % (ref 39.0–52.0)
Hemoglobin: 14.5 g/dL (ref 13.0–17.0)
MCH: 35 pg — ABNORMAL HIGH (ref 26.0–34.0)
MCHC: 33.3 g/dL (ref 30.0–36.0)
MCV: 105.3 fL — ABNORMAL HIGH (ref 80.0–100.0)
Platelets: 165 10*3/uL (ref 150–400)
RBC: 4.14 MIL/uL — ABNORMAL LOW (ref 4.22–5.81)
RDW: 13.4 % (ref 11.5–15.5)
WBC: 13.4 10*3/uL — ABNORMAL HIGH (ref 4.0–10.5)
nRBC: 0 % (ref 0.0–0.2)

## 2021-10-05 LAB — PROTIME-INR
INR: 1.4 — ABNORMAL HIGH (ref 0.8–1.2)
Prothrombin Time: 17.1 seconds — ABNORMAL HIGH (ref 11.4–15.2)

## 2021-10-05 LAB — LACTIC ACID, PLASMA
Lactic Acid, Venous: 3.9 mmol/L (ref 0.5–1.9)
Lactic Acid, Venous: 1.9 mmol/L (ref 0.5–1.9)

## 2021-10-05 LAB — PROCALCITONIN: Procalcitonin: 3.03 ng/mL

## 2021-10-05 LAB — BRAIN NATRIURETIC PEPTIDE: B Natriuretic Peptide: 130.7 pg/mL — ABNORMAL HIGH (ref 0.0–100.0)

## 2021-10-05 LAB — MRSA NEXT GEN BY PCR, NASAL: MRSA by PCR Next Gen: NOT DETECTED

## 2021-10-05 LAB — GLUCOSE, CAPILLARY: Glucose-Capillary: 170 mg/dL — ABNORMAL HIGH (ref 70–99)

## 2021-10-05 LAB — CBG MONITORING, ED: Glucose-Capillary: 140 mg/dL — ABNORMAL HIGH (ref 70–99)

## 2021-10-05 MED ORDER — IPRATROPIUM-ALBUTEROL 0.5-2.5 (3) MG/3ML IN SOLN
3.0000 mL | Freq: Once | RESPIRATORY_TRACT | Status: AC
  Administered 2021-10-05: 07:00:00 3 mL via RESPIRATORY_TRACT
  Filled 2021-10-05: qty 3

## 2021-10-05 MED ORDER — FUROSEMIDE 40 MG PO TABS
40.0000 mg | ORAL_TABLET | Freq: Every day | ORAL | Status: DC
  Administered 2021-10-05 – 2021-10-06 (×2): 40 mg via ORAL
  Filled 2021-10-05: qty 2
  Filled 2021-10-05: qty 1

## 2021-10-05 MED ORDER — FUROSEMIDE 20 MG PO TABS: 40.0000 mg | ORAL_TABLET | Freq: Every day | ORAL | Status: DC

## 2021-10-05 MED ORDER — GABAPENTIN 300 MG PO CAPS
300.0000 mg | ORAL_CAPSULE | Freq: Three times a day (TID) | ORAL | Status: DC
  Administered 2021-10-05 – 2021-10-06 (×2): 300 mg via ORAL
  Filled 2021-10-05 (×2): qty 1

## 2021-10-05 MED ORDER — PERFLUTREN LIPID MICROSPHERE
1.0000 mL | INTRAVENOUS | Status: AC | PRN
Start: 2021-10-05 — End: 2021-10-05
  Administered 2021-10-05: 11:00:00 2 mL via INTRAVENOUS
  Filled 2021-10-05: qty 10

## 2021-10-05 MED ORDER — LACTATED RINGERS IV BOLUS: 500.0000 mL | Freq: Once | INTRAVENOUS | Status: DC

## 2021-10-05 MED ORDER — INSULIN ASPART 100 UNIT/ML IJ SOLN
0.0000 [IU] | Freq: Three times a day (TID) | INTRAMUSCULAR | Status: DC
  Administered 2021-10-05 – 2021-10-06 (×2): 2 [IU] via SUBCUTANEOUS

## 2021-10-05 MED ORDER — SODIUM CHLORIDE 0.9 % IV SOLN
500.0000 mg | INTRAVENOUS | Status: DC
  Administered 2021-10-05: 10:00:00 500 mg via INTRAVENOUS
  Filled 2021-10-05 (×2): qty 5

## 2021-10-05 MED ORDER — LACTATED RINGERS IV SOLN: INTRAVENOUS | Status: AC

## 2021-10-05 NOTE — ED Notes (Signed)
Pt assisted back to bed after using urinal. Leads replaced and O2 via [place] applied correctly to face by this RN. No apparent distress at this time. Will continue to monitor.

## 2021-10-05 NOTE — ED Notes (Addendum)
Patient Saturations on Room Air at Rest = 89%  Patient Saturations on [employer] while Ambulating = 82%  Patient Saturations on 4 Liters of oxygen while Ambulating = 92%  Please briefly explain why patient needs home oxygen: COPD and desats while on room air

## 2021-10-05 NOTE — Care Management (Signed)
Asked RN to perform oxygen qualifications for home oxygen

## 2021-10-05 NOTE — Progress Notes (Addendum)
° °  Subjective: No major overnight events. Down to 4L supplemental oxygen this morning. He is feeling better as well.   Objective:  Vital signs in last 24 hours: Vitals:   10/05/21 0945 10/05/21 1000 10/05/21 1015 10/05/21 1030  BP: (!) 126/94 138/84 (!) 142/91 (!) 147/100  Pulse: 90 88 87 82  Resp: (!) 21 (!) 23 (!) 29 (!) 23  Temp:      TempSrc:      SpO2: 94% 99% 96% 94%  Weight:      Height:       Physical Exam Vitals reviewed.  Cardiovascular:     Rate and Rhythm: Normal rate and regular rhythm.     Pulses: Normal pulses.     Heart sounds: No murmur heard. Pulmonary:     Comments: Found on 4L O2->moved to 3L with no desat Mildly increased work of breathing Decreased breath sound bilaterally with some scattered wheezes, R>L Abdominal:     General: Bowel sounds are normal.     Palpations: Abdomen is soft.     Tenderness: There is no abdominal tenderness.  Musculoskeletal:     Comments: Legs with chronic venous stasis changes, 2+ pitting edema up to the proximal tibia bilaterally  Neurological:     Mental Status: He is alert.     Assessment/Plan:  Principal Problem:   COPD with acute exacerbation (HCC)  #Acute hypoxic respiratory failure secondary to COPD exacerbation Patient with several past COPD exacerbations, all managed as an outpatient, presented with dyspnea, requiring non-rebreather mask, as well as productive cough. On Spiriva and symbicor at home, though compliance is questionable. He has been weaned to 3L [place]. He requires 3L  PRN at night as an outpatient. He has been switched from cefepime to azithromycin.  -continue prednisone 40mg  daily to complete 5 day course (last dose 12/23) -low suspicion for bacterial infection. Lactic acidosis resolved. D/c cefepime. Start azithromycin 500mg  x3d.  -wean supplemental oxygen as able -continue inhalers, prn duonebs -maintain O2 saturation 88-92% -DME order for home O2 (patient currently uses his partner's  O2).   #Fatty liver disease RUQ [place] on 4/22 noted fatty liver. Patient is child pugh A, do not suspect cirhosis, though his platelets are low-normal, INR is slightly elevated, and Tbili is slightly up. No signs of cirrhosis on exam and no ascites on ABD [place].  -no further workup this admission -Counsel on alcohol cessation -f/u with PCP for ongoing monitoring  #CHF, not in acute exacerbation Recent note from Dr. Terri Skains doubts HF history. Does not appear volume overloaded on exam. BNP of 130.  -Will f/u echocardiogram results -Restart home Lasix 40 mg daily  #Alcohol use disorder CIWA of 0 so far. -continue CIWA monitoring -TOC to assist with resources/counseling  #Tobacco use disorder -Encourage cessation  FEN: heart healthy diet VTE: Lovenox 70 mg Code status: FULL Dispo: TBD, likely home, PT eval in  Corky Sox, MD PGY-1 Pager: [phone] After 5pm on weekdays and 1pm on weekends: On Call pager: [phone]

## 2021-10-05 NOTE — Evaluation (Signed)
Physical Therapy Evaluation Patient Details Name: Elijah Barton MRN: [id number] DOB: 25-Feb-1956 Today's Date: 10/05/2021  History of Present Illness  Patient is a 65 y/o male admitted with history of COPD on 3 L O2 at night, tobacco use disorder, alcohol use disorder, fatty liver disease, hypertension hyperlipidemia who presented to the ED with worsening shortness of breath over the last 2 to 3 days, found to have COPD/CHF exacerbation.  Clinical Impression  Patient presents with mobility not far from baseline.  Patient hopeful for admission with some IV Lasix for diuresis.  Given mobility and potential for cardiorenal issues with fluid shifts feel he will continue to benefit from skilled PT in the acute setting to ensure safety with mobility and to limit risk for complications from immobility.  Likely not to need follow up PT at d/c.      Recommendations for follow up therapy are one component of a multi-disciplinary discharge planning process, led by the attending physician.  Recommendations may be updated based on patient status, additional functional criteria and insurance authorization.  Follow Up Recommendations No PT follow up    Assistance Recommended at Discharge PRN  Functional Status Assessment Patient has had a recent decline in their functional status and demonstrates the ability to make significant improvements in function in a reasonable and predictable amount of time.  Equipment Recommendations  None recommended by PT    Recommendations for Other Services       Precautions / Restrictions Precautions Precautions: Fall Precaution Comments: falls when intoxicated and recent fall tripping on entry step      Mobility  Bed Mobility Overal bed mobility: Modified Independent             General bed mobility comments: pulls up on rails on stretcher    Transfers Overall transfer level: Needs assistance   Transfers: Sit to/from Stand Sit to Stand: Supervision            General transfer comment: for safety after assist to don shoes in sitting    Ambulation/Gait Ambulation/Gait assistance: Supervision;Min guard Gait Distance (Feet): 200 Feet Assistive device: None Gait Pattern/deviations: Step-through pattern;Wide base of support       General Gait Details: mild anterior bias and with increased speed; HR 112, SpO2 94% after ambulating on 3L O2  Stairs            Wheelchair Mobility    Modified Rankin (Stroke Patients Only)       Balance Overall balance assessment: No apparent balance deficits (not formally assessed)                                           Pertinent Vitals/Pain Pain Assessment: 0-10 Pain Score: 5  Pain Location: both legs from swelling Pain Descriptors / Indicators: Tightness Pain Intervention(s): Monitored during session;Repositioned    Home Living Family/patient expects to be discharged to:: Private residence Living Arrangements: Spouse/significant other (girlfriend, laurie Engineer, maintenance (IT)) Available Help at Discharge: Friend(s) Type of Home: House Home Access: Stairs to enter Entrance Stairs-Rails: Right;Left;Can reach both Entrance Stairs-Number of Steps: 5   Home Layout: One level Home Equipment: Grab bars - toilet;Grab bars - tub/shower;Shower Land (2 wheels);Cane - single point      Prior Function Prior Level of Function : Independent/Modified Independent             Mobility Comments: occasionally uses cane or walker, has  had couple of falls usually due to ETOH, but once coming into the house caught foot on the lip and fell on R knee       Hand Dominance   Dominant Hand: Right    Extremity/Trunk Assessment   Upper Extremity Assessment Upper Extremity Assessment: Overall WFL for tasks assessed    Lower Extremity Assessment Lower Extremity Assessment: Overall WFL for tasks assessed    Cervical / Trunk Assessment Cervical / Trunk Assessment: Other  exceptions Cervical / Trunk Exceptions: stiffness in trunk, admits to unable to reach feet for socks/shoes so slips feet in shoes; 50# weight gain in few months tight legs and abdomen  Communication   Communication: No difficulties  Cognition Arousal/Alertness: Awake/alert Behavior During Therapy: WFL for tasks assessed/performed Overall Cognitive Status: Within Functional Limits for tasks assessed                                          General Comments General comments (skin integrity, edema, etc.): edema in LE's and feet with some errthema    Exercises     Assessment/Plan    PT Assessment Patient needs continued PT services  PT Problem List Decreased safety awareness;Decreased mobility;Decreased activity tolerance;Decreased balance       PT Treatment Interventions Therapeutic activities;Gait training;Therapeutic exercise;Patient/family education;Stair training;Functional mobility training;Balance training    PT Goals (Current goals can be found in the Care Plan section)  Acute Rehab PT Goals Patient Stated Goal: get some of this weight off PT Goal Formulation: With patient Time For Goal Achievement: 10/19/21 Potential to Achieve Goals: Good    Frequency Min 3X/week   Barriers to discharge        Co-evaluation               AM-PAC PT "6 Clicks" Mobility  Outcome Measure Help needed turning from your back to your side while in a flat bed without using bedrails?: None Help needed moving from lying on your back to sitting on the side of a flat bed without using bedrails?: None Help needed moving to and from a bed to a chair (including a wheelchair)?: A Little Help needed standing up from a chair using your arms (e.g., wheelchair or bedside chair)?: None Help needed to walk in hospital room?: A Little Help needed climbing 3-5 steps with a railing? : A Little 6 Click Score: 21    End of Session Equipment Utilized During Treatment:  Oxygen Activity Tolerance: Patient tolerated treatment well Patient left: in bed;with call bell/phone within reach (seated EOB)   PT Visit Diagnosis: Other abnormalities of gait and mobility (R26.89);History of falling (Z91.81)    Time: 4431-5400 PT Time Calculation (min) (ACUTE ONLY): 25 min   Charges:   PT Evaluation $PT Eval Low Complexity: 1 Low PT Treatments $Gait Training: 8-22 mins        Magda Kiel, PT Acute Rehabilitation Services QQPYP:[phone] Office:[phone] 10/05/2021   Reginia Naas 10/05/2021, 4:40 PM

## 2021-10-06 ENCOUNTER — Ambulatory Visit: Payer: Medicare Other | Admitting: Cardiology

## 2021-10-06 ENCOUNTER — Other Ambulatory Visit (HOSPITAL_COMMUNITY): Payer: Self-pay

## 2021-10-06 LAB — CBC
HCT: 43.1 % (ref 39.0–52.0)
Hemoglobin: 14.2 g/dL (ref 13.0–17.0)
MCH: 35.2 pg — ABNORMAL HIGH (ref 26.0–34.0)
MCHC: 32.9 g/dL (ref 30.0–36.0)
MCV: 106.9 fL — ABNORMAL HIGH (ref 80.0–100.0)
Platelets: 168 10*3/uL (ref 150–400)
RBC: 4.03 MIL/uL — ABNORMAL LOW (ref 4.22–5.81)
RDW: 13.4 % (ref 11.5–15.5)
WBC: 15.4 10*3/uL — ABNORMAL HIGH (ref 4.0–10.5)
nRBC: 0 % (ref 0.0–0.2)

## 2021-10-06 LAB — HEMOGLOBIN A1C
Hgb A1c MFr Bld: 5.4 % (ref 4.8–5.6)
Mean Plasma Glucose: 108.28 mg/dL

## 2021-10-06 LAB — BASIC METABOLIC PANEL
Anion gap: 8 (ref 5–15)
BUN: 16 mg/dL (ref 8–23)
CO2: 32 mmol/L (ref 22–32)
Calcium: 9.3 mg/dL (ref 8.9–10.3)
Chloride: 100 mmol/L (ref 98–111)
Creatinine, Ser: 0.95 mg/dL (ref 0.61–1.24)
GFR, Estimated: >60 mL/min (ref >60)
Glucose, Bld: 107 mg/dL — ABNORMAL HIGH (ref 70–99)
Potassium: 4.7 mmol/L (ref 3.5–5.1)
Sodium: 140 mmol/L (ref 135–145)

## 2021-10-06 LAB — GLUCOSE, CAPILLARY
Glucose-Capillary: 127 mg/dL — ABNORMAL HIGH (ref 70–99)
Glucose-Capillary: 104 mg/dL — ABNORMAL HIGH (ref 70–99)

## 2021-10-06 MED ORDER — PREDNISONE 20 MG PO TABS
40.0000 mg | ORAL_TABLET | Freq: Every day | ORAL | 0 refills | Status: AC
Start: 2021-10-07 — End: 2021-10-08
  Filled 2021-10-06: qty 2, 1d supply, fill #0

## 2021-10-06 MED ORDER — AZITHROMYCIN 500 MG PO TABS
500.0000 mg | ORAL_TABLET | Freq: Every day | ORAL | Status: DC
  Administered 2021-10-06: 12:00:00 500 mg via ORAL
  Filled 2021-10-06: qty 1

## 2021-10-06 MED ORDER — AZITHROMYCIN 500 MG PO TABS
500.0000 mg | ORAL_TABLET | Freq: Every day | ORAL | 0 refills | Status: AC
  Filled 2021-10-06: qty 1, 1d supply, fill #0

## 2021-10-06 MED ORDER — AZITHROMYCIN 500 MG PO TABS: 500.0000 mg | ORAL_TABLET | Freq: Every day | ORAL | 0 refills | Status: DC

## 2021-10-06 MED ORDER — PREDNISONE 20 MG PO TABS: 40.0000 mg | ORAL_TABLET | Freq: Every day | ORAL | 0 refills | Status: DC

## 2021-10-06 NOTE — Discharge Summary (Signed)
Name: Elijah Barton MRN: [id number] DOB: Jan 11, 1956 65 y.o. PCP: Maximiano Coss, NP  Date of Admission: 10/04/2021  5:05 PM Date of Discharge:  10/06/2021 Attending Physician: Aldine Contes, MD  Discharge Diagnosis: 1. COPD 2. Fatty liver disease 3. Alcohol use disorder 4. Tobacco use disorder 5. HTN 6. HLD  Discharge Medications: Allergies as of 10/06/2021       Reactions   Penicillins Other (See Comments)   Childhood Allergy Has patient had a PCN reaction causing immediate rash, facial/tongue/throat swelling, SOB or lightheadedness with hypotension: Unknown Has patient had a PCN reaction causing severe rash involving mucus membranes or skin necrosis: Unknown Has patient had a PCN reaction that required hospitalization: Unknown Has patient had a PCN reaction occurring within the last 10 years: Unknown If all of the above answers are "NO", then may proceed with Cephalosporin use.        Medication List     STOP taking these medications    cyclobenzaprine 10 MG tablet Commonly known as: FLEXERIL   doxycycline 100 MG tablet Commonly known as: VIBRA-TABS   hydrochlorothiazide 25 MG tablet Commonly known as: HYDRODIURIL   naproxen 500 MG tablet Commonly known as: NAPROSYN       TAKE these medications    albuterol 108 (90 Base) MCG/ACT inhaler Commonly known as: VENTOLIN HFA Inhale 2 puffs into the lungs every 6 (six) hours as needed for wheezing or shortness of breath.   aspirin EC 81 MG tablet Take 1 tablet (81 mg total) by mouth daily. Swallow whole.   azithromycin 500 MG tablet Commonly known as: ZITHROMAX Take 1 tablet (500 mg total) by mouth daily for 1 day.   b complex vitamins tablet Take 1 tablet by mouth 3 (three) times a week.   budesonide-formoterol 160-4.5 MCG/ACT inhaler Commonly known as: Symbicort Inhale 2 puffs into the lungs in the morning and at bedtime.   buPROPion 150 MG 12 hr tablet Commonly known as: Wellbutrin SR Take  1 tablet (150 mg total) by mouth 2 (two) times daily.   cholecalciferol 25 MCG (1000 UNIT) tablet Commonly known as: VITAMIN D3 Take 1,000 Units by mouth daily.   CO Q 10 PO Take 1 tablet by mouth daily.   fluticasone 50 MCG/ACT nasal spray Commonly known as: FLONASE Place 2 sprays into both nostrils daily.   furosemide 20 MG tablet Commonly known as: LASIX Take 2 tablets (40 mg total) by mouth daily.   gabapentin 300 MG capsule Commonly known as: NEURONTIN Take 1 capsule (300 mg total) by mouth every morning AND 1 capsule (300 mg total) daily at 12 noon AND 2 capsules (600 mg total) at bedtime.   losartan 50 MG tablet Commonly known as: COZAAR Take 1 tablet (50 mg total) by mouth daily.   predniSONE 20 MG tablet Commonly known as: DELTASONE Take 2 tablets (40 mg total) by mouth daily with breakfast for 1 day. Start taking on: October 07, 2021   rosuvastatin 10 MG tablet Commonly known as: CRESTOR Take 1 tablet (10 mg total) by mouth at bedtime.   Spiriva Respimat 2.5 MCG/ACT Aers Generic drug: Tiotropium Bromide Monohydrate Inhale 2 puffs into the lungs daily.   TURMERIC PO Take 1 tablet by mouth 2 (two) times daily.               Durable Medical Equipment  (From admission, onward)           Start     Ordered   10/06/21 0636  For  home use only DME oxygen  Once       Question Answer Comment  Length of Need Lifetime   Mode or (Route) Nasal cannula   Liters per Minute 2   Frequency Continuous (stationary and portable oxygen unit needed)   Oxygen delivery system Gas      10/06/21 0635   10/05/21 1303  For home use only DME oxygen  Once       Question Answer Comment  Length of Need 12 Months   Mode or (Route) Nasal cannula   Liters per Minute 4   Frequency Continuous (stationary and portable oxygen unit needed)   Oxygen conserving device Yes   Oxygen delivery system Gas      10/05/21 1303            Disposition and follow-up:    [name] was discharged from [facility] in Stable condition.  At the hospital follow up visit please address:  1.  Continue to monitor for cirrhosis. Counsel on abstinence from alcohol and tobacco.   2.  Labs / imaging needed at time of follow-up: NA  3.  Pending labs/ test needing follow-up: NA  Follow-up Appointments:  Follow-up [place] Follow up.   Why: home oxygen                Hospital Course by problem list: 1. COPD Patient with several past COPD exacerbations, all managed as an outpatient, presented with dyspnea, requiring non-rebreather mask, as well as productive cough. On Spiriva and symbicort at home, though compliance is questionable. He was started on prednisone 40 mg for a 5 day course and a 3 day course of azithromycin. On the day of discharge he denied shortness of breath and had been weaned to 2.5L nasal cannula. He had ambulatory O2 sats done, showing desaturation with ambulation on room air to 82%.   He was given home O2.   2. Fatty liver disease RUQ [place] on 4/22 noted fatty liver. Patient is child pugh A, and we do not suspect cirhosis, though his platelets are low-normal, INR is slightly elevated, and Tbili is slightly up. No signs of cirrhosis on exam and no ascites on ABD US performed on 12/19.   3. Concern for heart failure Echo on 12/20 with EF of 60-65%, indeterminate diastolic function. Patient does not appear to have heart failure from a clinical perspective. He is on Lasix at home, as well as HCTZ. He was instructed to discontinue the HCTZ.  Discharge Exam:   BP 125/68    Pulse 77    Temp 98 F (36.7 C) (Oral)    Resp 18    Ht 6\' 2"  (1.88 m)    Wt (!) 147.4 kg    SpO2 94%    BMI 41.73 kg/m  Discharge exam:  Physical Exam Vitals reviewed.  Cardiovascular:     Rate and Rhythm: Normal rate and regular rhythm.     Pulses: Normal pulses.     Heart sounds: No murmur heard. Pulmonary:     Effort:  Pulmonary effort is normal.     Comments: Slightly decreased breath sounds, but without wheezes. Markedly improved from previous days Abdominal:     General: Bowel sounds are normal.     Palpations: Abdomen is soft.     Tenderness: There is no abdominal tenderness.  Musculoskeletal:     Comments: Chronic venous stasis changes of the lower extremities, pitting edema to the proximal tibia.  Skin:    General: Skin is warm and dry.  Neurological:     Mental Status: He is alert and oriented to person, place, and time.     Pertinent Labs, Studies, and Procedures:  CXR was unremarkable, BNP of 130  Discharge Instructions: You were admitted for a COPD flare (exacerbation). This was treated with azithromycin and prednisone. You should continue these medications until the dates listed. You were also given home oxygen. Please follow up with your primary doctor within 7 days.   Signed: Corky Sox, MD PGY-1

## 2021-10-06 NOTE — Plan of Care (Signed)
°  Problem: Education: Goal: Knowledge of General Education information will improve Description: Including pain rating scale, medication(s)/side effects and non-pharmacologic comfort measures Outcome: Progressing   Problem: Health Behavior/Discharge Planning: Goal: Ability to manage health-related needs will improve Outcome: Progressing   Problem: Clinical Measurements: Goal: Ability to maintain clinical measurements within normal limits will improve Outcome: Progressing Goal: Will remain free from infection Outcome: Progressing Goal: Diagnostic test results will improve Outcome: Progressing Goal: Respiratory complications will improve Outcome: Progressing Goal: Cardiovascular complication will be avoided Outcome: Progressing   Problem: Activity: Goal: Risk for activity intolerance will decrease Outcome: Progressing   Problem: Coping: Goal: Level of anxiety will decrease Outcome: Progressing   Problem: Safety: Goal: Ability to remain free from injury will improve Outcome: Progressing

## 2021-10-06 NOTE — TOC Transition Note (Addendum)
Transition of Care [facility]) - CM/SW Discharge Note   Patient Details  Name: Elijah Barton MRN: [id number] Date of Birth: 1956/09/10  [facility] CM/SW Contact:  Zenon Mayo, RN Phone Number: 10/06/2021, 1:45 PM   Clinical Narrative:    NCM spoke with patient on the phone, he states his girlfriend has home oxygen and he will be using her oxygen.  NCM informed him that he can not do that , he needs his own oxygen.  NCM asked if he had a preference of the DME company for oxygen.  He states he does not want [employer] because his girlfriend has oxygen with them and they had to come out 3 times to change the concentrator.  He states [employer] will be ok and to have them call him.  NCM made referral to [facility] with [employer] for the home oxygen, it will be delivered to his room prior to dc. NCM gave patient [facility] resources as well.    Final next level of care: Home/Self Care Barriers to Discharge: No Barriers Identified   Patient Goals and CMS Choice Patient states their goals for this hospitalization and ongoing recovery are:: return home      Discharge Placement                       Discharge Plan and Services                DME Arranged: Oxygen DME Agency: [employer] Date DME Agency Contacted: 10/06/21 Time DME Agency Contacted: [id number] Representative spoke with at DME Agency: Brenton Grills HH Arranged: NA          Social Determinants of Health ([place]) Interventions     Readmission Risk Interventions No flowsheet data found.

## 2021-10-06 NOTE — Plan of Care (Signed)
°  Problem: Education: Goal: Knowledge of General Education information will improve Description: Including pain rating scale, medication(s)/side effects and non-pharmacologic comfort measures 10/06/2021 1204 by Leonie Man, RN Outcome: Adequate for Discharge 10/06/2021 1202 by Leonie Man, RN Outcome: Progressing   Problem: Health Behavior/Discharge Planning: Goal: Ability to manage health-related needs will improve 10/06/2021 1204 by Leonie Man, RN Outcome: Adequate for Discharge 10/06/2021 1202 by Leonie Man, RN Outcome: Progressing   Problem: Clinical Measurements: Goal: Ability to maintain clinical measurements within normal limits will improve 10/06/2021 1204 by Leonie Man, RN Outcome: Adequate for Discharge 10/06/2021 1202 by Leonie Man, RN Outcome: Progressing Goal: Will remain free from infection 10/06/2021 1204 by Leonie Man, RN Outcome: Adequate for Discharge 10/06/2021 1202 by Leonie Man, RN Outcome: Progressing Goal: Diagnostic test results will improve 10/06/2021 1204 by Leonie Man, RN Outcome: Adequate for Discharge 10/06/2021 1202 by Leonie Man, RN Outcome: Progressing Goal: Respiratory complications will improve 10/06/2021 1204 by Leonie Man, RN Outcome: Adequate for Discharge 10/06/2021 1202 by Leonie Man, RN Outcome: Progressing Goal: Cardiovascular complication will be avoided 10/06/2021 1204 by Leonie Man, RN Outcome: Adequate for Discharge 10/06/2021 1202 by Leonie Man, RN Outcome: Progressing   Problem: Activity: Goal: Risk for activity intolerance will decrease 10/06/2021 1204 by Leonie Man, RN Outcome: Adequate for Discharge 10/06/2021 1202 by Leonie Man, RN Outcome: Progressing   Problem: Coping: Goal: Level of anxiety will decrease 10/06/2021 1204 by Leonie Man, RN Outcome: Adequate for Discharge 10/06/2021 1202 by Leonie Man, RN Outcome: Progressing   Problem: Safety: Goal: Ability  to remain free from injury will improve 10/06/2021 1204 by Leonie Man, RN Outcome: Adequate for Discharge 10/06/2021 1202 by Leonie Man, RN Outcome: Progressing

## 2021-10-06 NOTE — Discharge Instructions (Signed)
You were admitted for a COPD flare (exacerbation). This was treated with azithromycin and prednisone. You should continue these medications until the dates listed. You were also given home oxygen. Please follow up with your primary doctor within 7 days.  Corky Sox, MD

## 2021-10-07 ENCOUNTER — Telehealth: Payer: Self-pay

## 2021-10-07 NOTE — Telephone Encounter (Signed)
Transition Care Management Unsuccessful Follow-up Telephone Call  Date of discharge and from where:  Elijah Barton 10/06/2021  Attempts:  1st Attempt  Reason for unsuccessful TCM follow-up call:  No answer/busy

## 2021-10-08 ENCOUNTER — Ambulatory Visit (INDEPENDENT_AMBULATORY_CARE_PROVIDER_SITE_OTHER): Payer: Medicare Other | Admitting: Family Medicine

## 2021-10-08 ENCOUNTER — Encounter: Payer: Self-pay | Admitting: Family Medicine

## 2021-10-08 ENCOUNTER — Other Ambulatory Visit: Payer: Self-pay

## 2021-10-08 VITALS — BP 132/64 | HR 96 | Temp 98.0°F | Resp 17 | Wt 339.8 lb

## 2021-10-08 DIAGNOSIS — J441 Chronic obstructive pulmonary disease with (acute) exacerbation: Secondary | ICD-10-CM | POA: Diagnosis not present

## 2021-10-08 DIAGNOSIS — F1010 Alcohol abuse, uncomplicated: Secondary | ICD-10-CM

## 2021-10-08 DIAGNOSIS — I878 Other specified disorders of veins: Secondary | ICD-10-CM | POA: Diagnosis not present

## 2021-10-08 DIAGNOSIS — L03115 Cellulitis of right lower limb: Secondary | ICD-10-CM

## 2021-10-08 DIAGNOSIS — R600 Localized edema: Secondary | ICD-10-CM | POA: Diagnosis not present

## 2021-10-08 DIAGNOSIS — R635 Abnormal weight gain: Secondary | ICD-10-CM

## 2021-10-08 DIAGNOSIS — L03116 Cellulitis of left lower limb: Secondary | ICD-10-CM

## 2021-10-08 LAB — CBC
HCT: 41.4 % (ref 39.0–52.0)
Hemoglobin: 13.9 g/dL (ref 13.0–17.0)
MCHC: 33.5 g/dL (ref 30.0–36.0)
MCV: 105.2 fl — ABNORMAL HIGH (ref 78.0–100.0)
Platelets: 194 10*3/uL (ref 150.0–400.0)
RBC: 3.94 Mil/uL — ABNORMAL LOW (ref 4.22–5.81)
RDW: 13.8 % (ref 11.5–15.5)
WBC: 7.9 10*3/uL (ref 4.0–10.5)

## 2021-10-08 LAB — COMPREHENSIVE METABOLIC PANEL
ALT: 22 U/L (ref 0–53)
AST: 23 U/L (ref 0–37)
Albumin: 3.8 g/dL (ref 3.5–5.2)
Alkaline Phosphatase: 67 U/L (ref 39–117)
BUN: 16 mg/dL (ref 6–23)
CO2: 34 mEq/L — ABNORMAL HIGH (ref 19–32)
Calcium: 9 mg/dL (ref 8.4–10.5)
Chloride: 99 mEq/L (ref 96–112)
Creatinine, Ser: 0.9 mg/dL (ref 0.40–1.50)
GFR: 89.07 mL/min (ref >60.00)
Glucose, Bld: 92 mg/dL (ref 70–99)
Potassium: 3.7 mEq/L (ref 3.5–5.1)
Sodium: 141 mEq/L (ref 135–145)
Total Bilirubin: 0.6 mg/dL (ref 0.2–1.2)
Total Protein: 6.7 g/dL (ref 6.0–8.3)

## 2021-10-08 MED ORDER — FOLIC ACID 1 MG PO TABS
1.0000 mg | ORAL_TABLET | Freq: Every day | ORAL | 2 refills | Status: DC
Start: 2021-10-08 — End: 2021-10-12

## 2021-10-08 MED ORDER — DOXYCYCLINE HYCLATE 100 MG PO TABS: 100.0000 mg | ORAL_TABLET | Freq: Two times a day (BID) | ORAL | 0 refills | Status: DC

## 2021-10-08 MED ORDER — THIAMINE HCL 100 MG PO TABS: 100.0000 mg | ORAL_TABLET | Freq: Every day | ORAL | 2 refills | Status: DC

## 2021-10-08 NOTE — Progress Notes (Signed)
Subjective:  Patient ID: Elijah Barton, male    DOB: Mar 27, 1956  Age: 65 y.o. MRN: [id number]  CC:  Chief Complaint  Patient presents with   Hospitalization Follow-up    HPI Elijah Barton presents for  Hospital follow-up.  Admitted 10/04/2021 through 10/06/2021.  COPD exacerbation Initially presented dyspnea requiring nonrebreather mask.  Initial cough,nasal congestion cough, green phlegm. Flu, covid testing negative. Lactate elevated initially - up to 3.9, normalized at 1.9.  At baseline Spiriva, Symbicort at home.  There is a question of medication compliance.  Treated with 40 mg prednisone for 5-day course, 3-day course of azithromycin.  Required oxygen supplementation, wean to 2.5 L nasal cannula at discharge for home use.  Ambulatory O2 sats with desat on ambulation with room air to 82%.  Has been on 4 liters O2 currently. Nocturnal O2 rx by pulmonary prior - Dr. Erin Fulling.  No fevers.  Eating, drinking well.  Continued on budesonide formoterol 2 puffs twice daily.  Completed azithromycin and prednisone - 1 dose at discharge. No difficulty with meds.  Still coughing some things up. Better overall.  Blood culture x2 at this time (4 days).  Still smoking - decreasing use. On nicotine patch.   Alcohol abuse: 336m per night. On rehab in past  - sober during that time. Drinking restarted in 2011. Not taking thiamine, folic acid at this time - plans to purchase - folic acid recommended prior.   Hepatic steatosis Per discharge summary platelets low normal, INR slightly elevated, T bili slightly elevated.  Previous ultrasound indicated fatty liver in April.  No signs of cirrhosis on exam, no ascites, and abdominal ultrasound repeated 12/19 for assessment of ascites and was negative for ascites.  Platelets normal at 168 on December 21, bilirubin 1.1 on December 20. Lab Results  Component Value Date   WBC 15.4 (H) 10/06/2021   HGB 14.2 10/06/2021   HCT 43.1 10/06/2021   MCV 106.9 (H)  10/06/2021   PLT 168 10/06/2021   Lab Results  Component Value Date   ALT 24 10/05/2021   AST 33 10/05/2021   ALKPHOS 53 10/05/2021   BILITOT 1.1 10/05/2021   Chronic venous stasis with pedal edema 2D echo during hospitalization with normal EF 60 to 65%., BNP mildly elevated at 130,  thought to have bilateral chronic venous insufficiency given skin changes.  Compression stockings recommended.  Continued on home Lasix, discontinued HCTZ during hospitalization.  Discharge blood pressure 125/68.  He does take losartan 50 mg daily as well for hypertension.  Crestor for hyperlipidemia.  Gabapentin for chronic low back pain - back on gabapentin.   Leg swelling persisted since hospitalization. Persistent redness. Usually redness resolved and swelling impve in the morning - not today.Usually less red in morning, worse in afternoon. No drainage/wounds. Not elevating legs.  Has not started compression stockings [place] Weight has increased 4 pounds on home scale since hospital. Working on low sodium diet - making some changes. Misunderstood d/c summary - only took 1 furosemide yesterday.   Wt Readings from Last 3 Encounters:  10/08/21 (!) 339 lb 12.8 oz (154.1 kg)  10/04/21 (!) 325 lb (147.4 kg)  09/21/21 (!) 320 lb (145.2 kg)    History Patient Active Problem List   Diagnosis Date Noted   COPD with acute exacerbation ([facility] 10/04/2021   Chronic bronchitis ([facility] 07/22/2020   Back spasm 11/27/2018   Hypoxia 09/19/2017   Notalgia 11/16/2015   Alcohol abuse, daily use 10/12/2014   COPD exacerbation (  [place]) 09/02/2013   BMI 37.0-37.9, adult 09/02/2013   Nicotine addiction 09/02/2013   Peripheral neuropathy 03/12/2013   Past Medical History:  Diagnosis Date   Alcohol abuse    Allergies    Arthritis    Back pain    Cataracts, bilateral    COPD (chronic obstructive pulmonary disease) (HCC)    Fatty liver    Glaucoma    History of depression    Hypertension    Joint pain     all over except for hip   Peripheral neuropathy    Substance abuse ([place])    Past Surgical History:  Procedure Laterality Date   LYMPH NODE BIOPSY  1970   neck   TONSILLECTOMY AND ADENOIDECTOMY  1968   Allergies  Allergen Reactions   Penicillins Other (See Comments)    Childhood Allergy Has patient had a PCN reaction causing immediate rash, facial/tongue/throat swelling, SOB or lightheadedness with hypotension: Unknown Has patient had a PCN reaction causing severe rash involving mucus membranes or skin necrosis: Unknown Has patient had a PCN reaction that required hospitalization: Unknown Has patient had a PCN reaction occurring within the last 10 years: Unknown If all of the above answers are "NO", then may proceed with Cephalosporin use.    Prior to Admission medications   Medication Sig Start Date End Date Taking? Authorizing Provider  albuterol (VENTOLIN HFA) 108 (90 Base) MCG/ACT inhaler Inhale 2 puffs into the lungs every 6 (six) hours as needed for wheezing or shortness of breath. 12/29/20  Yes Midge Minium, MD  aspirin EC 81 MG tablet Take 1 tablet (81 mg total) by mouth daily. Swallow whole. 08/20/21  Yes Tolia, Sunit, DO  b complex vitamins tablet Take 1 tablet by mouth 3 (three) times a week.   Yes [provider]  budesonide-formoterol (SYMBICORT) 160-4.5 MCG/ACT inhaler Inhale 2 puffs into the lungs in the morning and at bedtime. 07/22/20  Yes [name], PA-C  buPROPion (WELLBUTRIN SR) 150 MG 12 hr tablet Take 1 tablet (150 mg total) by mouth 2 (two) times daily. 01/26/21  Yes Maximiano Coss, NP  cholecalciferol (VITAMIN D3) 25 MCG (1000 UNIT) tablet Take 1,000 Units by mouth daily.   Yes [provider]  Coenzyme Q10 (CO Q 10 PO) Take 1 tablet by mouth daily.   Yes [provider]  fluticasone (FLONASE) 50 MCG/ACT nasal spray Place 2 sprays into both nostrils daily. 02/23/21  Yes Maximiano Coss, NP  furosemide (LASIX) 20 MG tablet  Take 2 tablets (40 mg total) by mouth daily. 02/23/21  Yes Maximiano Coss, NP  gabapentin (NEURONTIN) 300 MG capsule Take 1 capsule (300 mg total) by mouth every morning AND 1 capsule (300 mg total) daily at 12 noon AND 2 capsules (600 mg total) at bedtime. 07/14/21  Yes Maximiano Coss, NP  losartan (COZAAR) 50 MG tablet Take 1 tablet (50 mg total) by mouth daily. 06/19/20  Yes [name], PA-C  predniSONE (DELTASONE) 20 MG tablet Take 2 tablets (40 mg total) by mouth daily with breakfast for 1 day. 10/07/21 10/08/21 Yes Corky Sox, MD  rosuvastatin (CRESTOR) 10 MG tablet Take 1 tablet (10 mg total) by mouth at bedtime. 08/20/21 11/18/21 Yes Tolia, Sunit, DO  Tiotropium Bromide Monohydrate (SPIRIVA RESPIMAT) 2.5 MCG/ACT AERS Inhale 2 puffs into the lungs daily. 07/14/21  Yes Maximiano Coss, NP  TURMERIC PO Take 1 tablet by mouth 2 (two) times daily.   Yes [provider]   Social History  Socioeconomic History   Marital status: Divorced    Spouse name: n/a   Number of children: 0   Years of education: Not on file   Highest education level: Not on file  Occupational History   Occupation: Armed forces technical officer    Employer: [employer]  Tobacco Use   Smoking status: Every Day    Packs/day: 3.00    Years: 40.00    Pack years: 120.00    Types: Cigarettes   Smokeless tobacco: Never  Vaping Use   Vaping Use: Former  Substance and Sexual Activity   Alcohol use: Yes    Alcohol/week: 15.0 standard drinks    Types: 15 Standard drinks or equivalent per week    Comment: 0-20 drinks/week. Pint of Liquor day    Drug use: Yes    Types: Marijuana   Sexual activity: Yes    Birth control/protection: Condom  Other Topics Concern   Not on file  Social History Narrative   Lives with his girlfriend with his 2 cats. Mother died in 03-28-13 in [place], [place] in her home with Hospice.  Daughter lives in [place], [place].   Social Determinants of Health   Financial Resource  Strain: Not on file  Food Insecurity: Not on file  Transportation Needs: Not on file  Physical Activity: Not on file  Stress: Not on file  Social Connections: Not on file  Intimate Partner Violence: Not on file    Review of Systems Per HPI.   Objective:   Vitals:   10/08/21 0759  BP: 132/64  Pulse: 96  Resp: 17  Temp: 98 F (36.7 C)  SpO2: 94%  Weight: (!) 339 lb 12.8 oz (154.1 kg)     Physical Exam Vitals reviewed.  Constitutional:      General: He is not in acute distress.    Appearance: He is well-developed. He is obese. He is not diaphoretic.  HENT:     Head: Normocephalic and atraumatic.  Neck:     Vascular: No carotid bruit or JVD.  Cardiovascular:     Rate and Rhythm: Normal rate and regular rhythm.     Heart sounds: Normal heart sounds. No murmur heard. Pulmonary:     Effort: Pulmonary effort is normal. No respiratory distress.     Breath sounds: Normal breath sounds. No stridor. No rhonchi or rales.     Comments: Distant but clear, no rales, no wheezing.  Normal effort Musculoskeletal:     Right lower leg: Edema (With erythema, see photos.  Bilateral.) present.     Left lower leg: Edema present.  Skin:    General: Skin is warm and dry.  Neurological:     Mental Status: He is alert and oriented to person, place, and time.  Psychiatric:        Mood and Affect: Mood normal.        Assessment & Plan:  Elijah Barton is a 65 y.o. male . COPD exacerbation ([place]) - Plan: Pro b natriuretic peptide, CBC  -Stable since hospitalization, lungs clear.  O2 sat stable on current dose of oxygen.  Completed antibiotics and prednisone.  Continue home inhaler regimen, oxygen same level for now with close follow-up in 4 to 5 days.  Also recommended follow-up with pulmonary.  ER/RTC precautions given.  Alcohol abuse - Plan: folic acid (FOLVITE) 1 MG tablet, thiamine 100 MG tablet, Comp Met (CMET)  -Folic acid, thiamine ordered given alcohol abuse.  Check CMP.  Close  follow-up with PCP to  discuss alcohol and potential resources.   Pedal edema - Plan: Ambulatory referral to Vascular Surgery Chronic venous stasis - Plan: Ambulatory referral to Vascular Surgery Weight gain - Plan: Pro b natriuretic peptide Bilateral lower leg cellulitis - Plan: doxycycline (VIBRA-TABS) 100 MG tablet -Suspect a component of chronic venous stasis, increased swelling, redness since hospitalization reported.  May have secondary cellulitis versus stasis dermatitis.  - Recommended leg elevation and starting compression stockings.  Paper prescription given for local fill -  Start doxycycline for possible cellulitis with close monitoring of erythema and watching for worsening symptoms.  RTC/ER precautions.   -Refer to vascular.  Recheck 4 to 5 days.   -Weight gain may be related to decreased dosing of furosemide and pedal edema.  BMP ordered that was normal.  Lungs clear, no signs of systemic fluid overload at this time.  Close monitoring.   Meds ordered this encounter  Medications   folic acid (FOLVITE) 1 MG tablet    Sig: Take 1 tablet (1 mg total) by mouth daily.    Dispense:  30 tablet    Refill:  2   thiamine 100 MG tablet    Sig: Take 1 tablet (100 mg total) by mouth daily.    Dispense:  30 tablet    Refill:  2   doxycycline (VIBRA-TABS) 100 MG tablet    Sig: Take 1 tablet (100 mg total) by mouth 2 (two) times daily.    Dispense:  14 tablet    Refill:  0   Patient Instructions  Return to 2 pills furosemide per day - that should help with swelling. Elevate legs when seated.  Compression stockings are recommended.  Prescription was given to fill locally, [facility] may be a good option to find ones that fit appropriately. Start doxycycline for possible secondary skin infection/cellulitis, but some of the discoloration and redness of legs could be from something called stasis dermatitis or stasis changes.  I will also refer you to a vascular specialist.  If any new  wounds or worsening redness of the leg to be seen right away.  That should be rechecked by your primary care provider early next week. Lungs sound good today.  I will check some follow-up blood work including the heart failure test but that was borderline elevated last time in the hospital and echocardiogram looked okay.  Lungs are clear today.  Call Dr. Erin Fulling (lung doctor) to schedule follow-up.  [phone]  Start thiamine, folic acid with history of alcohol use.  I would like you to discuss it further with your primary care provider at follow-up to see if we can help provide some assistance in cutting back.  Keep up the good work on cutting back on tobacco.  Keep up the good work on trying to decrease sodium in the diet.  Thanks for coming in today.  Return to the clinic or go to the nearest emergency room if any of your symptoms worsen or new symptoms occur.     Signed,   Merri Ray, MD [place], [facility] 10/08/21 8:08 AM

## 2021-10-08 NOTE — Patient Instructions (Addendum)
Return to 2 pills furosemide per day - that should help with swelling. Elevate legs when seated.  Compression stockings are recommended.  Prescription was given to fill locally, [facility] may be a good option to find ones that fit appropriately. Start doxycycline for possible secondary skin infection/cellulitis, but some of the discoloration and redness of legs could be from something called stasis dermatitis or stasis changes.  I will also refer you to a vascular specialist.  If any new wounds or worsening redness of the leg to be seen right away.  That should be rechecked by your primary care provider early next week. Lungs sound good today.  I will check some follow-up blood work including the heart failure test but that was borderline elevated last time in the hospital and echocardiogram looked okay.  Lungs are clear today.  Call Dr. Erin Fulling (lung doctor) to schedule follow-up.  [phone]  Start thiamine, folic acid with history of alcohol use.  I would like you to discuss it further with your primary care provider at follow-up to see if we can help provide some assistance in cutting back.  Keep up the good work on cutting back on tobacco.  Keep up the good work on trying to decrease sodium in the diet.  Thanks for coming in today.  Return to the clinic or go to the nearest emergency room if any of your symptoms worsen or new symptoms occur.

## 2021-10-08 NOTE — Telephone Encounter (Signed)
Transition Care Management Unsuccessful Follow-up Telephone Call  Date of discharge and from where:  [facility] 10/06/2021  Attempts:  2nd Attempt  Reason for unsuccessful TCM follow-up call:  No answer/busy

## 2021-10-09 LAB — CULTURE, BLOOD (ROUTINE X 2)
Culture: NO GROWTH
Culture: NO GROWTH
Special Requests: ADEQUATE
Special Requests: ADEQUATE

## 2021-10-09 LAB — PRO B NATRIURETIC PEPTIDE: NT-Pro BNP: 107 pg/mL (ref 0–376)

## 2021-10-12 ENCOUNTER — Telehealth: Payer: Self-pay

## 2021-10-12 ENCOUNTER — Other Ambulatory Visit: Payer: Self-pay

## 2021-10-12 DIAGNOSIS — L03115 Cellulitis of right lower limb: Secondary | ICD-10-CM

## 2021-10-12 DIAGNOSIS — F1010 Alcohol abuse, uncomplicated: Secondary | ICD-10-CM

## 2021-10-12 MED ORDER — THIAMINE HCL 100 MG PO TABS: 100.0000 mg | ORAL_TABLET | Freq: Every day | ORAL | 2 refills | Status: AC

## 2021-10-12 MED ORDER — DOXYCYCLINE HYCLATE 100 MG PO TABS: 100.0000 mg | ORAL_TABLET | Freq: Two times a day (BID) | ORAL | 0 refills | Status: DC

## 2021-10-12 MED ORDER — FOLIC ACID 1 MG PO TABS: 1.0000 mg | ORAL_TABLET | Freq: Every day | ORAL | 2 refills | Status: DC

## 2021-10-12 NOTE — Telephone Encounter (Signed)
Caller name:Pt needs   On DPR? :Yes  Call back [phone]  Provider they see: Delfino Lovett  Reason for call:Pt is calling Dr Nyoka Cowden Seen patient on Friday and the medication that was being sent to the pharmacy did not got through due to a power outage at the pharmacy pt is needing these medications to be resent doxycycline (VIBRA-TABS) 100 MG tablet , thiamine 484 MG tablet , folic acid (FOLVITE) 1 MG tablet    [facility] - [place], [place] - [address]  [address], [place] [address]

## 2021-10-12 NOTE — Telephone Encounter (Signed)
Caller name:Elijah Barton callback [phone]  Encourage patient to contact the pharmacy for refills or they can request refills through [facility]  (Please schedule appointment if patient has not been seen in over a year)  MEDICATION NAME & DOSE:Pt is receiving Oxygen he wants to switch to [facility] pt is not happy with [facility] and there delivery people   Notes/Comments from patient:  [place]: Send oxygen to [facility] the fax number [phone]  Please notify patient: It takes 48-72 hours to process rx refill requests Ask patient to call pharmacy to ensure rx is ready before heading there.   (CLINICAL TO FILL OR ROUTE PER PROTOCOLS)

## 2021-10-12 NOTE — Telephone Encounter (Signed)
Medications have been resent, patient aware.

## 2021-10-13 ENCOUNTER — Other Ambulatory Visit: Payer: Self-pay

## 2021-10-13 ENCOUNTER — Ambulatory Visit (INDEPENDENT_AMBULATORY_CARE_PROVIDER_SITE_OTHER): Payer: Medicare Other | Admitting: Registered Nurse

## 2021-10-13 ENCOUNTER — Inpatient Hospital Stay: Payer: Medicare Other | Admitting: Registered Nurse

## 2021-10-13 VITALS — BP 138/76 | HR 79 | Temp 98.2°F | Ht 73.0 in | Wt 339.6 lb

## 2021-10-13 DIAGNOSIS — R600 Localized edema: Secondary | ICD-10-CM | POA: Diagnosis not present

## 2021-10-13 DIAGNOSIS — F1010 Alcohol abuse, uncomplicated: Secondary | ICD-10-CM

## 2021-10-13 DIAGNOSIS — H269 Unspecified cataract: Secondary | ICD-10-CM

## 2021-10-13 DIAGNOSIS — L03116 Cellulitis of left lower limb: Secondary | ICD-10-CM | POA: Diagnosis not present

## 2021-10-13 DIAGNOSIS — L03115 Cellulitis of right lower limb: Secondary | ICD-10-CM

## 2021-10-13 NOTE — Progress Notes (Signed)
Established Patient Office Visit  Subjective:  Patient ID: Elijah Barton, male    DOB: Oct 21, 1955  Age: 65 y.o. MRN: [id number]  CC:  Chief Complaint  Patient presents with   Follow-up    Patient states he is here to discuss follow up on weight gain,dermatitis     HPI Jed Kutch presents for follow up  Seen last week by Dr. Merri Ray for weight gain and dermatitis  Had been referred to vascular surgery. Referral still in process, no appt yet.   Had ben given doxycycline for suspected cellulitis / dermatitis. Has continued course without complication.  Suspected that weight loss related to decrease in dose of furosemide. Pro-bnp wnl at last visit. He does note improvement in dermatitis/cellulitis and plans to complete course of doxycycline.  Since last visit, pt feels better. Notes that he has been monitoring pulse and spO2 and they have been stable.  Does note that he did bump his leg into the edge of his bed, small wound, does not feel that there is a risk of infection.  He has continued on home oxygen. 3L/min. Doing well. Occasional activities around the house without oxygen, temporary desat to 91% - quickly bounces back.  Does note cataracts. Both eyes. Dx some time ago. Feeling ready to have these revisited and operated on if eligible. Would like referral to ophthalmology.  Past Medical History:  Diagnosis Date   Alcohol abuse    Allergies    Arthritis    Back pain    Cataracts, bilateral    COPD (chronic obstructive pulmonary disease) (HCC)    Fatty liver    Glaucoma    History of depression    Hypertension    Joint pain    all over except for hip   Peripheral neuropathy    Substance abuse ([place])     Past Surgical History:  Procedure Laterality Date   LYMPH NODE BIOPSY  1970   neck   TONSILLECTOMY AND ADENOIDECTOMY  1968    Family History  Problem Relation Age of Onset   Colon cancer Mother    Heart disease Mother    Stroke Mother    Cancer  Mother        lung   Alcohol abuse Mother    Arthritis Mother    Asthma Mother    COPD Mother    Depression Mother    Cirrhosis Father    Alcohol abuse Father    Alcohol abuse Brother     Social History   Socioeconomic History   Marital status: Divorced    Spouse name: n/a   Number of children: 0   Years of education: Not on file   Highest education level: Not on file  Occupational History   Occupation: Armed forces technical officer    Employer: [employer]  Tobacco Use   Smoking status: Every Day    Packs/day: 3.00    Years: 40.00    Pack years: 120.00    Types: Cigarettes   Smokeless tobacco: Never  Vaping Use   Vaping Use: Former  Substance and Sexual Activity   Alcohol use: Yes    Alcohol/week: 15.0 standard drinks    Types: 15 Standard drinks or equivalent per week    Comment: 0-20 drinks/week. Pint of Liquor day    Drug use: Yes    Types: Marijuana   Sexual activity: Yes    Birth control/protection: Condom  Other Topics Concern   Not on file  Social History  Narrative   Lives with his girlfriend with his 2 cats. Mother died in 2013-03-20 in [place], [place] in her home with Hospice.  Daughter lives in [place], [place].   Social Determinants of Health   Financial Resource Strain: Not on file  Food Insecurity: Not on file  Transportation Needs: Not on file  Physical Activity: Not on file  Stress: Not on file  Social Connections: Not on file  Intimate Partner Violence: Not on file    Outpatient Medications Prior to Visit  Medication Sig Dispense Refill   albuterol (VENTOLIN HFA) 108 (90 Base) MCG/ACT inhaler Inhale 2 puffs into the lungs every 6 (six) hours as needed for wheezing or shortness of breath. 8 g 2   aspirin EC 81 MG tablet Take 1 tablet (81 mg total) by mouth daily. Swallow whole. 30 tablet 11   b complex vitamins tablet Take 1 tablet by mouth 3 (three) times a week.     buPROPion (WELLBUTRIN SR) 150 MG 12 hr tablet Take 1 tablet (150 mg total) by  mouth 2 (two) times daily. 180 tablet 0   cholecalciferol (VITAMIN D3) 25 MCG (1000 UNIT) tablet Take 1,000 Units by mouth daily.     Coenzyme Q10 (CO Q 10 PO) Take 1 tablet by mouth daily.     doxycycline (VIBRA-TABS) 100 MG tablet Take 1 tablet (100 mg total) by mouth 2 (two) times daily. 14 tablet 0   fluticasone (FLONASE) 50 MCG/ACT nasal spray Place 2 sprays into both nostrils daily. 16 g 6   folic acid (FOLVITE) 1 MG tablet Take 1 tablet (1 mg total) by mouth daily. 30 tablet 2   furosemide (LASIX) 20 MG tablet Take 2 tablets (40 mg total) by mouth daily. 30 tablet 3   gabapentin (NEURONTIN) 300 MG capsule Take 1 capsule (300 mg total) by mouth every morning AND 1 capsule (300 mg total) daily at 12 noon AND 2 capsules (600 mg total) at bedtime. 270 capsule 1   losartan (COZAAR) 50 MG tablet Take 1 tablet (50 mg total) by mouth daily. 90 tablet 3   rosuvastatin (CRESTOR) 10 MG tablet Take 1 tablet (10 mg total) by mouth at bedtime. 90 tablet 0   thiamine 100 MG tablet Take 1 tablet (100 mg total) by mouth daily. 30 tablet 2   TURMERIC PO Take 1 tablet by mouth 2 (two) times daily.     budesonide-formoterol (SYMBICORT) 160-4.5 MCG/ACT inhaler Inhale 2 puffs into the lungs in the morning and at bedtime. 1 each 12   Tiotropium Bromide Monohydrate (SPIRIVA RESPIMAT) 2.5 MCG/ACT AERS Inhale 2 puffs into the lungs daily. 8 g 3   No facility-administered medications prior to visit.    Allergies  Allergen Reactions   Penicillins Other (See Comments)    Childhood Allergy Has patient had a PCN reaction causing immediate rash, facial/tongue/throat swelling, SOB or lightheadedness with hypotension: Unknown Has patient had a PCN reaction causing severe rash involving mucus membranes or skin necrosis: Unknown Has patient had a PCN reaction that required hospitalization: Unknown Has patient had a PCN reaction occurring within the last 10 years: Unknown If all of the above answers are "NO", then may  proceed with Cephalosporin use.     ROS Review of Systems Per hpi     Objective:    Physical Exam Constitutional:      General: He is not in acute distress.    Appearance: Normal appearance. He is normal weight. He is not ill-appearing, toxic-appearing or diaphoretic.  Cardiovascular:     Rate and Rhythm: Normal rate and regular rhythm.     Heart sounds: Normal heart sounds. No murmur heard.   No friction rub. No gallop.  Pulmonary:     Effort: Pulmonary effort is normal. No respiratory distress.     Breath sounds: Normal breath sounds. No stridor. No wheezing, rhonchi or rales.  Chest:     Chest wall: No tenderness.  Musculoskeletal:     Right lower leg: Edema present.     Left lower leg: Edema present.  Skin:    General: Skin is warm and dry.     Capillary Refill: Capillary refill takes 2 to 3 seconds.     Coloration: Skin is not jaundiced or pale.     Findings: Erythema and rash present. No bruising or lesion.  Neurological:     General: No focal deficit present.     Mental Status: He is alert and oriented to person, place, and time. Mental status is at baseline.  Psychiatric:        Mood and Affect: Mood normal.        Behavior: Behavior normal.        Thought Content: Thought content normal.        Judgment: Judgment normal.    BP 138/76    Pulse 79    Temp 98.2 F (36.8 C) (Temporal)    Ht 6\' 1"  (1.854 m)    Wt (!) 339 lb 9.6 oz (154 kg)    SpO2 98%    BMI 44.80 kg/m  Wt Readings from Last 3 Encounters:  10/13/21 (!) 339 lb 9.6 oz (154 kg)  10/08/21 (!) 339 lb 12.8 oz (154.1 kg)  10/04/21 (!) 325 lb (147.4 kg)     Health Maintenance Due  Topic Date Due   Pneumonia Vaccine 63+ Years old (1 - PCV) Never done   Zoster Vaccines- Shingrix (1 of 2) Never done   COVID-19 Vaccine (3 - Booster for Pfizer series) 09/16/2020    There are no preventive care reminders to display for this patient.  Lab Results  Component Value Date   TSH 3.90 01/26/2021   Lab  Results  Component Value Date   WBC 7.9 10/08/2021   HGB 13.9 10/08/2021   HCT 41.4 10/08/2021   MCV 105.2 (H) 10/08/2021   PLT 194.0 10/08/2021   Lab Results  Component Value Date   NA 141 10/08/2021   K 3.7 10/08/2021   CO2 34 (H) 10/08/2021   GLUCOSE 92 10/08/2021   BUN 16 10/08/2021   CREATININE 0.90 10/08/2021   BILITOT 0.6 10/08/2021   ALKPHOS 67 10/08/2021   AST 23 10/08/2021   ALT 22 10/08/2021   PROT 6.7 10/08/2021   ALBUMIN 3.8 10/08/2021   CALCIUM 9.0 10/08/2021   ANIONGAP 8 10/06/2021   GFR 89.07 10/08/2021   Lab Results  Component Value Date   CHOL 165 01/26/2021   Lab Results  Component Value Date   HDL 58.10 01/26/2021   Lab Results  Component Value Date   LDLCALC 89 01/26/2021   Lab Results  Component Value Date   TRIG 85.0 01/26/2021   Lab Results  Component Value Date   CHOLHDL 3 01/26/2021   Lab Results  Component Value Date   HGBA1C 5.4 10/06/2021      Assessment & Plan:   Problem List Items Addressed This Visit   None Visit Diagnoses     Cataract of both eyes, unspecified cataract type    -  Primary   Relevant Orders   Ambulatory referral to Ophthalmology   Bilateral lower leg cellulitis       Alcohol abuse       Pedal edema           No orders of the defined types were placed in this encounter.   Follow-up: No follow-ups on file.   PLAN Refer to ophthalmology. COPD exacerbation and cellulitis improving as anticipated. Counseled at length regarding cessation of tobacco and alcohol. While his heart is apparently healthy, his vascular status is of concern and his lifestyle is not sustainable.  Return as scheduled. Patient encouraged to call clinic with any questions, comments, or concerns.  Maximiano Coss, NP

## 2021-10-13 NOTE — Patient Instructions (Signed)
° ° ° °  If you have lab work done today you will be contacted with your lab results within the next 2 weeks.  If you have not heard from us then please contact us. The fastest way to get your results is to register for My Chart. ° ° °IF you received an x-ray today, you will receive an invoice from [facility]. Please contact [facility] at [phone] with questions or concerns regarding your invoice.  ° °IF you received labwork today, you will receive an invoice from [facility]. Please contact [facility] at [phone] with questions or concerns regarding your invoice.  ° °Our billing staff will not be able to assist you with questions regarding bills from these companies. ° °You will be contacted with the lab results as soon as they are available. The fastest way to get your results is to activate your My Chart account. Instructions are located on the last page of this paperwork. If you have not heard from us regarding the results in 2 weeks, please contact this office. °  ° ° ° °

## 2021-10-15 ENCOUNTER — Encounter: Payer: Self-pay | Admitting: Registered Nurse

## 2021-10-15 ENCOUNTER — Telehealth: Payer: Self-pay | Admitting: Registered Nurse

## 2021-10-15 ENCOUNTER — Other Ambulatory Visit: Payer: Self-pay

## 2021-10-15 DIAGNOSIS — J22 Unspecified acute lower respiratory infection: Secondary | ICD-10-CM

## 2021-10-15 MED ORDER — BUDESONIDE-FORMOTEROL FUMARATE 160-4.5 MCG/ACT IN AERO: 2.0000 | INHALATION_SPRAY | Freq: Two times a day (BID) | RESPIRATORY_TRACT | 12 refills | Status: DC

## 2021-10-15 MED ORDER — SPIRIVA RESPIMAT 2.5 MCG/ACT IN AERS: 2.0000 | INHALATION_SPRAY | Freq: Every day | RESPIRATORY_TRACT | 3 refills | Status: DC

## 2021-10-15 NOTE — Telephone Encounter (Signed)
Called pt back and informed

## 2021-10-15 NOTE — Telephone Encounter (Signed)
Pt is requesting that you review ER note and give him medications based off of the note. Recent visit 10/13/21  ER visit he was advised to stop Cyclobenzaprine, Doxycycline, Hydrochlorothiazide, and Naproxen   I do not see recommendation for other meds but were several concerns noted on his discharge.

## 2021-10-15 NOTE — Telephone Encounter (Signed)
Ok to hold doxycycline as he had been put on z pack, should hold HCTZ as he is on lasix, ok to continue sparing use of naproxen and cyclobenzaprine as instructed as needed.  Thanks,  Denice Paradise

## 2021-10-15 NOTE — Telephone Encounter (Signed)
Pt on air post ER and is asking why he is not taking symbicourt and spiriva as he was before ER visit would like to know if he should resume these? If so he will need refills

## 2021-10-15 NOTE — Telephone Encounter (Signed)
..  Caller name:Jedidiah Eulas Post  On DPR? :yes/no: Yes  Call back [phone]  Provider they see: Orland Mustard  Reason for call: Patient would like a symbicort inhaler called in to [facility] - patient would like you to review the hospital notes from his recent discharge to determine if there are any other medications that he should be on.

## 2021-10-15 NOTE — Telephone Encounter (Signed)
Pt is asking for inhalers as he has been spiriva and symbicort to be sent in as he does not have anymore post ER

## 2021-10-15 NOTE — Telephone Encounter (Signed)
Have sent  Thanks,  Rich

## 2021-10-19 ENCOUNTER — Telehealth: Payer: Self-pay

## 2021-10-19 NOTE — Telephone Encounter (Signed)
Caller name:Lionel Eulas Post   On DPR? :Yes  Call back number:[phone]  Provider they see: Delfino Lovett  Reason for call:Pt was prescribe 15 to 20 mmhg on compression sock he is having hard time find that size to fit him and wants to know if he can go up to the 20 to 30 mmhg

## 2021-10-19 NOTE — Telephone Encounter (Signed)
Ok to change.  Thank you  Rich

## 2021-10-19 NOTE — Telephone Encounter (Signed)
Pt having trouble finding appropriate compression level in his size is it okay to change compression slightly to accommodate size?

## 2021-10-20 NOTE — Telephone Encounter (Signed)
Called pt back and informed him of approval to change. Voiced understanding

## 2021-10-22 ENCOUNTER — Encounter: Payer: Self-pay | Admitting: Registered Nurse

## 2021-10-22 ENCOUNTER — Other Ambulatory Visit: Payer: Self-pay | Admitting: Registered Nurse

## 2021-10-22 ENCOUNTER — Encounter (INDEPENDENT_AMBULATORY_CARE_PROVIDER_SITE_OTHER): Payer: Self-pay

## 2021-10-25 ENCOUNTER — Other Ambulatory Visit: Payer: Self-pay

## 2021-10-25 ENCOUNTER — Encounter: Payer: Self-pay | Admitting: Family Medicine

## 2021-10-25 ENCOUNTER — Other Ambulatory Visit: Payer: Medicare HMO

## 2021-10-25 ENCOUNTER — Telehealth (INDEPENDENT_AMBULATORY_CARE_PROVIDER_SITE_OTHER): Payer: Medicare Other | Admitting: Family Medicine

## 2021-10-25 VITALS — BP 130/71 | HR 80 | Ht 74.0 in | Wt 340.0 lb

## 2021-10-25 DIAGNOSIS — J449 Chronic obstructive pulmonary disease, unspecified: Secondary | ICD-10-CM

## 2021-10-25 DIAGNOSIS — J069 Acute upper respiratory infection, unspecified: Secondary | ICD-10-CM

## 2021-10-25 DIAGNOSIS — R0982 Postnasal drip: Secondary | ICD-10-CM

## 2021-10-25 DIAGNOSIS — Z720 Tobacco use: Secondary | ICD-10-CM

## 2021-10-25 DIAGNOSIS — R490 Dysphonia: Secondary | ICD-10-CM

## 2021-10-25 MED ORDER — FEXOFENADINE HCL 180 MG PO TABS: 180.0000 mg | ORAL_TABLET | Freq: Every day | ORAL | 0 refills | Status: AC

## 2021-10-25 NOTE — Telephone Encounter (Signed)
Pt said he could not remember at this point what was discussed at the appt and wants that oxygen switched to [facility] information is provided below

## 2021-10-25 NOTE — Progress Notes (Signed)
Virtual Visit via Video Note  I connected with Elijah Barton on 10/25/21 at  1:00 PM EST by a video enabled telemedicine application 2/2 [id number] pandemic and verified that I am speaking with the correct person using two identifiers.  Location patient: home Location provider:work or home office Persons participating in the virtual visit: patient, provider  I discussed the limitations of evaluation and management by telemedicine and the availability of in person appointments. The patient expressed understanding and agreed to proceed.   HPI: Pt endorses rhinorrhea and mild productive cough x a couple of days.  Mucinex helped.  Postnasal drainage and intermittent hoarseness noted. Denies SOB, increased WOB, HAs, fever, n/v, diarrhea, bloating, abdominal pain, jaundice.  Had some wheezing yesterday.  Using regular inhalers including Symbicort and Spiriva.  Appetite is good.   Denies recent sick contacts.  Waiting for home COVID test to be delivered.  Also expecting a decongestant to be delivered from pharmacy. Pt is a daily smoker, working to cut down.  Currently smoking 1 cigarette per hr. On Wellbutrin. On 2 L O2 continuous via [place].  Inquires about humidifier.  Pt was hospitalized 10/04/21 for COPD exacerbation.  Completed a course of doxycycline and prednisone.  Notes resolution of symptoms until now.  Pt had 2 COVID vaccines.  ROS: See pertinent positives and negatives per HPI.  Past Medical History:  Diagnosis Date   Alcohol abuse    Allergies    Arthritis    Back pain    Cataracts, bilateral    COPD (chronic obstructive pulmonary disease) (HCC)    Fatty liver    Glaucoma    History of depression    Hypertension    Joint pain    all over except for hip   Peripheral neuropathy    Substance abuse ([place])     Past Surgical History:  Procedure Laterality Date   LYMPH NODE BIOPSY  1970   neck   TONSILLECTOMY AND ADENOIDECTOMY  1968    Family History  Problem Relation Age of  Onset   Colon cancer Mother    Heart disease Mother    Stroke Mother    Cancer Mother        lung   Alcohol abuse Mother    Arthritis Mother    Asthma Mother    COPD Mother    Depression Mother    Cirrhosis Father    Alcohol abuse Father    Alcohol abuse Brother      Current Outpatient Medications:    albuterol (VENTOLIN HFA) 108 (90 Base) MCG/ACT inhaler, Inhale 2 puffs into the lungs every 6 (six) hours as needed for wheezing or shortness of breath., Disp: 8 g, Rfl: 2   aspirin EC 81 MG tablet, Take 1 tablet (81 mg total) by mouth daily. Swallow whole., Disp: 30 tablet, Rfl: 11   b complex vitamins tablet, Take 1 tablet by mouth 3 (three) times a week., Disp: , Rfl:    budesonide-formoterol (SYMBICORT) 160-4.5 MCG/ACT inhaler, Inhale 2 puffs into the lungs in the morning and at bedtime., Disp: 1 each, Rfl: 12   buPROPion (WELLBUTRIN SR) 150 MG 12 hr tablet, Take 1 tablet (150 mg total) by mouth 2 (two) times daily., Disp: 180 tablet, Rfl: 0   cholecalciferol (VITAMIN D3) 25 MCG (1000 UNIT) tablet, Take 1,000 Units by mouth daily., Disp: , Rfl:    Coenzyme Q10 (CO Q 10 PO), Take 1 tablet by mouth daily., Disp: , Rfl:    doxycycline (VIBRA-TABS) 100 MG  tablet, Take 1 tablet (100 mg total) by mouth 2 (two) times daily., Disp: 14 tablet, Rfl: 0   fluticasone (FLONASE) 50 MCG/ACT nasal spray, Place 2 sprays into both nostrils daily., Disp: 16 g, Rfl: 6   folic acid (FOLVITE) 1 MG tablet, Take 1 tablet (1 mg total) by mouth daily., Disp: 30 tablet, Rfl: 2   furosemide (LASIX) 20 MG tablet, Take 2 tablets (40 mg total) by mouth daily., Disp: 30 tablet, Rfl: 3   gabapentin (NEURONTIN) 300 MG capsule, Take 1 capsule (300 mg total) by mouth every morning AND 1 capsule (300 mg total) daily at 12 noon AND 2 capsules (600 mg total) at bedtime., Disp: 270 capsule, Rfl: 1   losartan (COZAAR) 50 MG tablet, TAKE ONE TABLET BY MOUTH daily, Disp: 90 tablet, Rfl: 3   rosuvastatin (CRESTOR) 10 MG tablet,  Take 1 tablet (10 mg total) by mouth at bedtime., Disp: 90 tablet, Rfl: 0   thiamine 100 MG tablet, Take 1 tablet (100 mg total) by mouth daily., Disp: 30 tablet, Rfl: 2   Tiotropium Bromide Monohydrate (SPIRIVA RESPIMAT) 2.5 MCG/ACT AERS, Inhale 2 puffs into the lungs daily., Disp: 8 g, Rfl: 3   TURMERIC PO, Take 1 tablet by mouth 2 (two) times daily., Disp: , Rfl:   EXAM:  VITALS per patient if applicable:  2L O2 via [place], 98.48F  GENERAL: alert, oriented, appears well and in no acute distress  HEENT: atraumatic, conjunctiva clear, no obvious abnormalities on inspection of external nose and ears  NECK: normal movements of the head and neck  LUNGS: on inspection no signs of respiratory distress, breathing rate appears normal, no obvious gross SOB, gasping or wheezing  CV: no obvious cyanosis  MS: moves all visible extremities without noticeable abnormality  PSYCH/NEURO: pleasant and cooperative, no obvious depression or anxiety, speech and thought processing grossly intact  ASSESSMENT AND PLAN:  Discussed the following assessment and plan:  Post-nasal drainage  - Plan: fexofenadine (ALLEGRA) 180 MG tablet  Viral URI with cough  Hoarseness of voice -Likely 2/2 postnasal drainage. -Supportive care including vocal rest, warm fluids, honey  Chronic obstructive pulmonary disease, unspecified COPD type (HCC) -Stable -Discussed contacting O2 supply company regarding humidifier  Nicotine use -Currently smoking 1 cigarette/h -Continue working on decreasing cigarette use -Extreme caution advised with cigarette use and O2  Patient currently with mild URI symptoms including rhinorrhea and postnasal drainage causing cough.  Discussed using OTC antihistamine to decrease nasal drainage.  Patient cautioned on use of decongestant as may cause elevation in BP.  Okay to use OTC Mucinex.  Look for OTC Mucinex HBP for patients with high blood pressure.  Consider saline nasal rinse or Flonase  nasal spray.  Continue current daily inhalers such as Symbicort and Spiriva.  As patient recently completed a course of doxycycline last week and prednisone hesitant to restart antibiotics and steroid.  Given patient's history concern symptoms may progress into COPD exacerbation.  Patient given strict precautions and advised to contact clinic for worsening symptoms including but not limited to increased O2 requirement, desats, fever, chills, increased sputum production, etc.  Patient expressed understanding.  Also advised to take home COVID test and wear mask when in public.  Follow-up with PCP as needed  I discussed the assessment and treatment plan with the patient. The patient was provided an opportunity to ask questions and all were answered. The patient agreed with the plan and demonstrated an understanding of the instructions.   The patient was advised  to call back or seek an in-person evaluation if the symptoms worsen or if the condition fails to improve as anticipated.    Billie Ruddy, MD

## 2021-10-25 NOTE — Telephone Encounter (Signed)
FYI about oxygen orders

## 2021-10-26 ENCOUNTER — Other Ambulatory Visit: Payer: Self-pay | Admitting: Registered Nurse

## 2021-10-26 DIAGNOSIS — J42 Unspecified chronic bronchitis: Secondary | ICD-10-CM | POA: Diagnosis not present

## 2021-10-26 DIAGNOSIS — F17219 Nicotine dependence, cigarettes, with unspecified nicotine-induced disorders: Secondary | ICD-10-CM

## 2021-10-26 DIAGNOSIS — J449 Chronic obstructive pulmonary disease, unspecified: Secondary | ICD-10-CM

## 2021-10-26 DIAGNOSIS — J441 Chronic obstructive pulmonary disease with (acute) exacerbation: Secondary | ICD-10-CM | POA: Diagnosis not present

## 2021-10-26 DIAGNOSIS — R0609 Other forms of dyspnea: Secondary | ICD-10-CM

## 2021-10-26 NOTE — Telephone Encounter (Signed)
Printed and faxed to [employer] per pt request

## 2021-10-26 NOTE — Telephone Encounter (Signed)
Printed order, will have it faxed  Thanks,  Denice Paradise

## 2021-11-03 ENCOUNTER — Ambulatory Visit: Payer: Medicare HMO | Admitting: Cardiology

## 2021-11-07 DIAGNOSIS — R0902 Hypoxemia: Secondary | ICD-10-CM | POA: Diagnosis not present

## 2021-11-07 DIAGNOSIS — J441 Chronic obstructive pulmonary disease with (acute) exacerbation: Secondary | ICD-10-CM | POA: Diagnosis not present

## 2021-11-10 ENCOUNTER — Encounter: Payer: Self-pay | Admitting: Registered Nurse

## 2021-11-11 ENCOUNTER — Encounter: Payer: Self-pay | Admitting: Registered Nurse

## 2021-11-11 ENCOUNTER — Other Ambulatory Visit: Payer: Self-pay | Admitting: Registered Nurse

## 2021-11-11 ENCOUNTER — Other Ambulatory Visit: Payer: Self-pay

## 2021-11-11 ENCOUNTER — Telehealth (INDEPENDENT_AMBULATORY_CARE_PROVIDER_SITE_OTHER): Payer: Medicare HMO | Admitting: Registered Nurse

## 2021-11-11 VITALS — HR 87 | Wt 325.0 lb

## 2021-11-11 DIAGNOSIS — L03115 Cellulitis of right lower limb: Secondary | ICD-10-CM | POA: Diagnosis not present

## 2021-11-11 DIAGNOSIS — F331 Major depressive disorder, recurrent, moderate: Secondary | ICD-10-CM | POA: Diagnosis not present

## 2021-11-11 DIAGNOSIS — R0981 Nasal congestion: Secondary | ICD-10-CM | POA: Diagnosis not present

## 2021-11-11 DIAGNOSIS — J22 Unspecified acute lower respiratory infection: Secondary | ICD-10-CM | POA: Diagnosis not present

## 2021-11-11 DIAGNOSIS — R609 Edema, unspecified: Secondary | ICD-10-CM

## 2021-11-11 DIAGNOSIS — R69 Illness, unspecified: Secondary | ICD-10-CM | POA: Diagnosis not present

## 2021-11-11 DIAGNOSIS — M545 Low back pain, unspecified: Secondary | ICD-10-CM

## 2021-11-11 DIAGNOSIS — L03116 Cellulitis of left lower limb: Secondary | ICD-10-CM

## 2021-11-11 DIAGNOSIS — G8929 Other chronic pain: Secondary | ICD-10-CM

## 2021-11-11 MED ORDER — ALBUTEROL SULFATE HFA 108 (90 BASE) MCG/ACT IN AERS: 2.0000 | INHALATION_SPRAY | Freq: Four times a day (QID) | RESPIRATORY_TRACT | 2 refills | Status: AC | PRN

## 2021-11-11 MED ORDER — SPIRIVA RESPIMAT 2.5 MCG/ACT IN AERS: 2.0000 | INHALATION_SPRAY | Freq: Every day | RESPIRATORY_TRACT | 3 refills | Status: DC

## 2021-11-11 MED ORDER — FLUTICASONE PROPIONATE 50 MCG/ACT NA SUSP: 2.0000 | Freq: Every day | NASAL | 6 refills | Status: AC

## 2021-11-11 MED ORDER — CYCLOBENZAPRINE HCL 10 MG PO TABS: 10.0000 mg | ORAL_TABLET | Freq: Three times a day (TID) | ORAL | 1 refills | Status: AC | PRN

## 2021-11-11 MED ORDER — SERTRALINE HCL 50 MG PO TABS: 50.0000 mg | ORAL_TABLET | Freq: Every day | ORAL | 0 refills | Status: DC

## 2021-11-11 MED ORDER — FUROSEMIDE 20 MG PO TABS: 40.0000 mg | ORAL_TABLET | Freq: Every day | ORAL | 3 refills | Status: AC

## 2021-11-11 MED ORDER — DOXYCYCLINE HYCLATE 100 MG PO TABS: 100.0000 mg | ORAL_TABLET | Freq: Two times a day (BID) | ORAL | 0 refills | Status: AC

## 2021-11-11 NOTE — Progress Notes (Signed)
Telemedicine Encounter- SOAP NOTE Established Patient  This telephone encounter was conducted with the patient's (or proxy's) verbal consent via audio telecommunications: yes/no: Yes Patient was instructed to have this encounter in a suitably private space; and to only have persons present to whom they give permission to participate. In addition, patient identity was confirmed by use of name plus two identifiers (DOB and address).  I discussed the limitations, risks, security and privacy concerns of performing an evaluation and management service by telephone and the availability of in person appointments. I also discussed with the patient that there may be a patient responsible charge related to this service. The patient expressed understanding and agreed to proceed.  I spent a total of  27 minutes talking with the patient or their proxy.  Patient at home Provider in office  Participants: Kathrin Ruddy, NP and Haig Prophet  Chief Complaint  Patient presents with   Anxiety    Patient states he is just severely depressed and anxiety. Patient states that his partner fell and broke some bones and he is just feeling down. PHQ9=9 Gad7=8    Subjective   Elijah Barton is a 66 y.o. established patient. Telephone visit today for stress, anxiety  HPI Stressors at home - girlfriend fell when leaving dialysis, broke femur Accident on 10/30/21, surgery on 11/07/21. Now she is in snf in [place]. He has concerns about the quality of the snf He sent her a laptop which got lost at [facility]. He does have trouble with his chronic respiratory conditions In addition to this he is a heavy smoker and heavy drinker, which gives him stress about his health Denies hi/si  Upper Respiratory Congestion Ongoing 1-2 days, caught in the rain and caught a chill He has been close to warranting hospitalization and has been hospitalized previously for respiratory infections, which are compounded by his smoking  and drinking history.  He would like to take steps to prevent progression of an infection and avoid hospitalization. He has anxieties surrounding the potential for hospitalization   Patient Active Problem List   Diagnosis Date Noted   COPD with acute exacerbation ([place]) 10/04/2021   Chronic bronchitis ([place]) 07/22/2020   Back spasm 11/27/2018   Hypoxia 09/19/2017   Notalgia 11/16/2015   Alcohol abuse, daily use 10/12/2014   COPD exacerbation ([place]) 09/02/2013   BMI 37.0-37.9, adult 09/02/2013   Nicotine addiction 09/02/2013   Peripheral neuropathy 03/12/2013    Past Medical History:  Diagnosis Date   Alcohol abuse    Allergies    Arthritis    Back pain    Cataracts, bilateral    COPD (chronic obstructive pulmonary disease) (HCC)    Fatty liver    Glaucoma    History of depression    Hypertension    Joint pain    all over except for hip   Peripheral neuropathy    Substance abuse ([place])     Current Outpatient Medications  Medication Sig Dispense Refill   aspirin EC 81 MG tablet Take 1 tablet (81 mg total) by mouth daily. Swallow whole. 30 tablet 11   b complex vitamins tablet Take 1 tablet by mouth 3 (three) times a week.     budesonide-formoterol (SYMBICORT) 160-4.5 MCG/ACT inhaler Inhale 2 puffs into the lungs in the morning and at bedtime. 1 each 12   buPROPion (WELLBUTRIN SR) 150 MG 12 hr tablet Take 1 tablet (150 mg total) by mouth 2 (two) times daily. 180 tablet 0   cholecalciferol (  VITAMIN D3) 25 MCG (1000 UNIT) tablet Take 1,000 Units by mouth daily.     Coenzyme Q10 (CO Q 10 PO) Take 1 tablet by mouth daily.     doxycycline (VIBRA-TABS) 100 MG tablet Take 1 tablet (100 mg total) by mouth 2 (two) times daily. 20 tablet 0   fexofenadine (ALLEGRA) 180 MG tablet Take 1 tablet (180 mg total) by mouth daily. 30 tablet 0   folic acid (FOLVITE) 1 MG tablet Take 1 tablet (1 mg total) by mouth daily. 30 tablet 2   furosemide (LASIX) 20 MG tablet Take 2 tablets (40 mg total) by  mouth daily. 30 tablet 3   gabapentin (NEURONTIN) 300 MG capsule Take 1 capsule (300 mg total) by mouth every morning AND 1 capsule (300 mg total) daily at 12 noon AND 2 capsules (600 mg total) at bedtime. 270 capsule 1   losartan (COZAAR) 50 MG tablet TAKE ONE TABLET BY MOUTH daily 90 tablet 3   rosuvastatin (CRESTOR) 10 MG tablet Take 1 tablet (10 mg total) by mouth at bedtime. 90 tablet 0   sertraline (ZOLOFT) 50 MG tablet Take 1 tablet (50 mg total) by mouth daily. 90 tablet 0   thiamine 100 MG tablet Take 1 tablet (100 mg total) by mouth daily. 30 tablet 2   TURMERIC PO Take 1 tablet by mouth 2 (two) times daily.     albuterol (VENTOLIN HFA) 108 (90 Base) MCG/ACT inhaler Inhale 2 puffs into the lungs every 6 (six) hours as needed for wheezing or shortness of breath. 8 g 2   fluticasone (FLONASE) 50 MCG/ACT nasal spray Place 2 sprays into both nostrils daily. 16 g 6   Tiotropium Bromide Monohydrate (SPIRIVA RESPIMAT) 2.5 MCG/ACT AERS Inhale 2 puffs into the lungs daily. 8 g 3   No current facility-administered medications for this visit.    Allergies  Allergen Reactions   Penicillins Other (See Comments)    Childhood Allergy Has patient had a PCN reaction causing immediate rash, facial/tongue/throat swelling, SOB or lightheadedness with hypotension: Unknown Has patient had a PCN reaction causing severe rash involving mucus membranes or skin necrosis: Unknown Has patient had a PCN reaction that required hospitalization: Unknown Has patient had a PCN reaction occurring within the last 10 years: Unknown If all of the above answers are "NO", then may proceed with Cephalosporin use.     Social History   Socioeconomic History   Marital status: Divorced    Spouse name: n/a   Number of children: 0   Years of education: Not on file   Highest education level: Not on file  Occupational History   Occupation: Armed forces technical officer    Employer: [employer]  Tobacco Use   Smoking  status: Every Day    Packs/day: 3.00    Years: 40.00    Pack years: 120.00    Types: Cigarettes   Smokeless tobacco: Never  Vaping Use   Vaping Use: Former  Substance and Sexual Activity   Alcohol use: Yes    Alcohol/week: 15.0 standard drinks    Types: 15 Standard drinks or equivalent per week    Comment: 0-20 drinks/week. Pint of Liquor day    Drug use: Yes    Types: Marijuana   Sexual activity: Yes    Birth control/protection: Condom  Other Topics Concern   Not on file  Social History Narrative   Lives with his girlfriend with his 2 cats. Mother died in 03/26/13 in [place], [place] in her  home with Hospice.  Daughter lives in [place], [place].   Social Determinants of Health   Financial Resource Strain: Not on file  Food Insecurity: Not on file  Transportation Needs: Not on file  Physical Activity: Not on file  Stress: Not on file  Social Connections: Not on file  Intimate Partner Violence: Not on file    ROS Per hpi   Objective   Vitals as reported by the patient: Today's Vitals   11/11/21 0851  Pulse: 87  SpO2: 93%  Weight: (!) 325 lb (147.4 kg)    Teng was seen today for anxiety.  Diagnoses and all orders for this visit:  Moderate episode of recurrent major depressive disorder (HCC) -     sertraline (ZOLOFT) 50 MG tablet; Take 1 tablet (50 mg total) by mouth daily.  Bilateral lower leg cellulitis -     doxycycline (VIBRA-TABS) 100 MG tablet; Take 1 tablet (100 mg total) by mouth 2 (two) times daily.  Nasal congestion -     fluticasone (FLONASE) 50 MCG/ACT nasal spray; Place 2 sprays into both nostrils daily.  Lower respiratory infection -     doxycycline (VIBRA-TABS) 100 MG tablet; Take 1 tablet (100 mg total) by mouth 2 (two) times daily. -     albuterol (VENTOLIN HFA) 108 (90 Base) MCG/ACT inhaler; Inhale 2 puffs into the lungs every 6 (six) hours as needed for wheezing or shortness of breath. -     Tiotropium Bromide Monohydrate (SPIRIVA RESPIMAT)  2.5 MCG/ACT AERS; Inhale 2 puffs into the lungs daily.    PLAN Doxycycline, albuterol and spiriva refills for respiratory infection. Discussed deep breathing exercises and staying active to help prevent PNA / lower respiratory infection.  Start sertraline 50mg  po qd. Med check in 5-6 weeks. Discussed risks, benefits, alternatives to this medication. Pt willing to start. Will give information on acute mental health resources. Patient encouraged to call clinic with any questions, comments, or concerns.  I discussed the assessment and treatment plan with the patient. The patient was provided an opportunity to ask questions and all were answered. The patient agreed with the plan and demonstrated an understanding of the instructions.   The patient was advised to call back or seek an in-person evaluation if the symptoms worsen or if the condition fails to improve as anticipated.  I provided 27 minutes of non-face-to-face time during this encounter.  Maximiano Coss, NP

## 2021-11-11 NOTE — Telephone Encounter (Signed)
Patient has an appointment scheduled today for his concerns.

## 2021-11-11 NOTE — Telephone Encounter (Signed)
Patient is requesting a refill of the following medications: Requested Prescriptions   Pending Prescriptions Disp Refills   furosemide (LASIX) 20 MG tablet 30 tablet 3    Sig: Take 2 tablets (40 mg total) by mouth daily.   cyclobenzaprine (FLEXERIL) 10 MG tablet 90 tablet 1    Sig: Take 1 tablet (10 mg total) by mouth 3 (three) times daily as needed for muscle spasms.    Date of patient request: 11/11/2021 Last office visit: 11/11/2021 Date of last refill: 06/19/2020-10/06/2021 Last refill amount: 90 tablets  Follow up time period per chart: none

## 2021-11-11 NOTE — Patient Instructions (Signed)
° ° ° °  If you have lab work done today you will be contacted with your lab results within the next 2 weeks.  If you have not heard from us then please contact us. The fastest way to get your results is to register for My Chart. ° ° °IF you received an x-ray today, you will receive an invoice from [facility]. Please contact [facility] at [phone] with questions or concerns regarding your invoice.  ° °IF you received labwork today, you will receive an invoice from [facility]. Please contact [facility] at [phone] with questions or concerns regarding your invoice.  ° °Our billing staff will not be able to assist you with questions regarding bills from these companies. ° °You will be contacted with the lab results as soon as they are available. The fastest way to get your results is to activate your My Chart account. Instructions are located on the last page of this paperwork. If you have not heard from us regarding the results in 2 weeks, please contact this office. °  ° ° ° °

## 2021-11-11 NOTE — Telephone Encounter (Signed)
Pt also needs the furosemide  Sent in as well

## 2021-11-11 NOTE — Telephone Encounter (Signed)
Pt called in asking for a new script of [facility]. States that he talked to [facility] today. He states he got this medication while in the hospital

## 2021-11-11 NOTE — Telephone Encounter (Signed)
Spoke with patient  Thanks,  Denice Paradise

## 2021-11-15 ENCOUNTER — Telehealth: Payer: Self-pay

## 2021-11-15 NOTE — Telephone Encounter (Signed)
Caller name:Mahamud Eulas Post   On DPR? :Yes  Call back number:[phone]  Provider they see: Richard  Reason for call:DMV Form needs to be completed placed in [facility]

## 2021-11-15 NOTE — Telephone Encounter (Signed)
Placed in your folder to be signed

## 2021-11-16 NOTE — Telephone Encounter (Signed)
Completed and left with Brooke's desk  Thanks,  Denice Paradise

## 2021-11-17 NOTE — Telephone Encounter (Signed)
Patient form was mailed out to his house on yesterday. Patient was contacted and made aware.

## 2021-11-22 ENCOUNTER — Other Ambulatory Visit: Payer: Self-pay

## 2021-11-22 DIAGNOSIS — I872 Venous insufficiency (chronic) (peripheral): Secondary | ICD-10-CM

## 2021-11-24 ENCOUNTER — Inpatient Hospital Stay (HOSPITAL_COMMUNITY): Admission: RE | Admit: 2021-11-24 | Payer: Medicare Other | Source: Ambulatory Visit

## 2021-11-26 DIAGNOSIS — J441 Chronic obstructive pulmonary disease with (acute) exacerbation: Secondary | ICD-10-CM | POA: Diagnosis not present

## 2021-11-26 DIAGNOSIS — J42 Unspecified chronic bronchitis: Secondary | ICD-10-CM | POA: Diagnosis not present

## 2021-11-29 ENCOUNTER — Telehealth: Payer: Self-pay

## 2021-11-29 NOTE — Telephone Encounter (Signed)
This needs to be in the patients chart, not the callers. Can review once we have appropriate patient information
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
## 2021-11-30 NOTE — Telephone Encounter (Signed)
Pt states that he is dealing with a lot of mental health issues due to the decline in Lori's health. I have scheduled him for an appt tomorrow with Delfino Lovett for a phone visit.

## 2021-12-01 ENCOUNTER — Telehealth: Payer: Medicare HMO | Admitting: Registered Nurse

## 2021-12-02 ENCOUNTER — Encounter: Payer: Self-pay | Admitting: Registered Nurse

## 2021-12-02 ENCOUNTER — Telehealth (INDEPENDENT_AMBULATORY_CARE_PROVIDER_SITE_OTHER): Payer: Medicare HMO | Admitting: Registered Nurse

## 2021-12-02 ENCOUNTER — Other Ambulatory Visit: Payer: Self-pay

## 2021-12-02 DIAGNOSIS — F331 Major depressive disorder, recurrent, moderate: Secondary | ICD-10-CM

## 2021-12-02 DIAGNOSIS — R69 Illness, unspecified: Secondary | ICD-10-CM | POA: Diagnosis not present

## 2021-12-02 MED ORDER — SERTRALINE HCL 100 MG PO TABS: 100.0000 mg | ORAL_TABLET | Freq: Every day | ORAL | 1 refills | Status: DC
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
## 2021-12-02 NOTE — Patient Instructions (Signed)
° ° ° °  If you have lab work done today you will be contacted with your lab results within the next 2 weeks.  If you have not heard from us then please contact us. The fastest way to get your results is to register for My Chart. ° ° °IF you received an x-ray today, you will receive an invoice from [facility]. Please contact [facility] at [phone] with questions or concerns regarding your invoice.  ° °IF you received labwork today, you will receive an invoice from [facility]. Please contact [facility] at [phone] with questions or concerns regarding your invoice.  ° °Our billing staff will not be able to assist you with questions regarding bills from these companies. ° °You will be contacted with the lab results as soon as they are available. The fastest way to get your results is to activate your My Chart account. Instructions are located on the last page of this paperwork. If you have not heard from us regarding the results in 2 weeks, please contact this office. °  ° ° ° °

## 2021-12-08 DIAGNOSIS — J441 Chronic obstructive pulmonary disease with (acute) exacerbation: Secondary | ICD-10-CM | POA: Diagnosis not present

## 2021-12-08 DIAGNOSIS — R0902 Hypoxemia: Secondary | ICD-10-CM | POA: Diagnosis not present

## 2021-12-09 ENCOUNTER — Emergency Department (HOSPITAL_COMMUNITY): Payer: Medicare HMO

## 2021-12-09 ENCOUNTER — Emergency Department (HOSPITAL_COMMUNITY)
Admission: EM | Admit: 2021-12-09 | Discharge: 2021-12-11 | Disposition: A | Discharge status: Home / Self Care | Payer: Medicare HMO | Attending: Emergency Medicine | Admitting: Emergency Medicine

## 2021-12-09 ENCOUNTER — Other Ambulatory Visit: Payer: Self-pay

## 2021-12-09 ENCOUNTER — Encounter (HOSPITAL_COMMUNITY): Payer: Self-pay | Admitting: *Deleted

## 2021-12-09 DIAGNOSIS — R1011 Right upper quadrant pain: Secondary | ICD-10-CM | POA: Diagnosis not present

## 2021-12-09 DIAGNOSIS — F1029 Alcohol dependence with unspecified alcohol-induced disorder: Secondary | ICD-10-CM | POA: Diagnosis not present

## 2021-12-09 DIAGNOSIS — Z79899 Other long term (current) drug therapy: Secondary | ICD-10-CM | POA: Diagnosis not present

## 2021-12-09 DIAGNOSIS — Z20822 Contact with and (suspected) exposure to covid-19: Secondary | ICD-10-CM | POA: Insufficient documentation

## 2021-12-09 DIAGNOSIS — F10288 Alcohol dependence with other alcohol-induced disorder: Secondary | ICD-10-CM | POA: Diagnosis not present

## 2021-12-09 DIAGNOSIS — J449 Chronic obstructive pulmonary disease, unspecified: Secondary | ICD-10-CM | POA: Diagnosis not present

## 2021-12-09 DIAGNOSIS — R531 Weakness: Secondary | ICD-10-CM | POA: Diagnosis not present

## 2021-12-09 DIAGNOSIS — J9811 Atelectasis: Secondary | ICD-10-CM | POA: Diagnosis not present

## 2021-12-09 DIAGNOSIS — R69 Illness, unspecified: Secondary | ICD-10-CM | POA: Diagnosis not present

## 2021-12-09 DIAGNOSIS — R0602 Shortness of breath: Secondary | ICD-10-CM | POA: Diagnosis not present

## 2021-12-09 DIAGNOSIS — I10 Essential (primary) hypertension: Secondary | ICD-10-CM | POA: Insufficient documentation

## 2021-12-09 DIAGNOSIS — Z7982 Long term (current) use of aspirin: Secondary | ICD-10-CM | POA: Insufficient documentation

## 2021-12-09 DIAGNOSIS — R1013 Epigastric pain: Secondary | ICD-10-CM | POA: Diagnosis not present

## 2021-12-09 DIAGNOSIS — R231 Pallor: Secondary | ICD-10-CM | POA: Diagnosis not present

## 2021-12-09 DIAGNOSIS — R059 Cough, unspecified: Secondary | ICD-10-CM | POA: Diagnosis not present

## 2021-12-09 LAB — CBC WITH DIFFERENTIAL/PLATELET
Abs Immature Granulocytes: 0.01 10*3/uL (ref 0.00–0.07)
Basophils Absolute: 0 10*3/uL (ref 0.0–0.1)
Basophils Relative: 1 %
Eosinophils Absolute: 0 10*3/uL (ref 0.0–0.5)
Eosinophils Relative: 1 %
HCT: 43.8 % (ref 39.0–52.0)
Hemoglobin: 15.3 g/dL (ref 13.0–17.0)
Immature Granulocytes: 0 %
Lymphocytes Relative: 20 %
Lymphs Abs: 0.9 10*3/uL (ref 0.7–4.0)
MCH: 36.2 pg — ABNORMAL HIGH (ref 26.0–34.0)
MCHC: 34.9 g/dL (ref 30.0–36.0)
MCV: 103.5 fL — ABNORMAL HIGH (ref 80.0–100.0)
Monocytes Absolute: 0.4 10*3/uL (ref 0.1–1.0)
Monocytes Relative: 9 %
Neutro Abs: 3 10*3/uL (ref 1.7–7.7)
Neutrophils Relative %: 69 %
Platelets: 112 10*3/uL — ABNORMAL LOW (ref 150–400)
RBC: 4.23 MIL/uL (ref 4.22–5.81)
RDW: 12.2 % (ref 11.5–15.5)
WBC: 4.4 10*3/uL (ref 4.0–10.5)
nRBC: 0 % (ref 0.0–0.2)

## 2021-12-09 LAB — COMPREHENSIVE METABOLIC PANEL
ALT: 69 U/L — ABNORMAL HIGH (ref 0–44)
AST: 79 U/L — ABNORMAL HIGH (ref 15–41)
Albumin: 3.8 g/dL (ref 3.5–5.0)
Alkaline Phosphatase: 61 U/L (ref 38–126)
Anion gap: 12 (ref 5–15)
BUN: 5 mg/dL — ABNORMAL LOW (ref 8–23)
CO2: 32 mmol/L (ref 22–32)
Calcium: 9 mg/dL (ref 8.9–10.3)
Chloride: 93 mmol/L — ABNORMAL LOW (ref 98–111)
Creatinine, Ser: 0.91 mg/dL (ref 0.61–1.24)
GFR, Estimated: >60 mL/min (ref >60)
Glucose, Bld: 136 mg/dL — ABNORMAL HIGH (ref 70–99)
Potassium: 3.7 mmol/L (ref 3.5–5.1)
Sodium: 137 mmol/L (ref 135–145)
Total Bilirubin: 1.4 mg/dL — ABNORMAL HIGH (ref 0.3–1.2)
Total Protein: 6.8 g/dL (ref 6.5–8.1)

## 2021-12-09 LAB — ETHANOL: Alcohol, Ethyl (B): <10 mg/dL (ref <10)

## 2021-12-09 LAB — MAGNESIUM: Magnesium: 1.5 mg/dL — ABNORMAL LOW (ref 1.7–2.4)

## 2021-12-09 LAB — RESP PANEL BY RT-PCR (FLU A&B, COVID) ARPGX2
Influenza A by PCR: NEGATIVE
Influenza B by PCR: NEGATIVE
SARS Coronavirus 2 by RT PCR: NEGATIVE

## 2021-12-09 LAB — LIPASE, BLOOD: Lipase: 31 U/L (ref 11–51)

## 2021-12-09 MED ORDER — LORAZEPAM 2 MG/ML IJ SOLN
0.0000 mg | Freq: Four times a day (QID) | INTRAMUSCULAR | Status: DC
  Administered 2021-12-09: 2 mg via INTRAVENOUS
  Filled 2021-12-09: qty 1

## 2021-12-09 MED ORDER — LOSARTAN POTASSIUM 50 MG PO TABS
50.0000 mg | ORAL_TABLET | Freq: Every day | ORAL | Status: DC
Start: 2021-12-09 — End: 2021-12-09

## 2021-12-09 MED ORDER — LORAZEPAM 2 MG/ML IJ SOLN
1.0000 mg | Freq: Once | INTRAMUSCULAR | Status: AC
  Administered 2021-12-09: 1 mg via INTRAVENOUS
  Filled 2021-12-09: qty 1

## 2021-12-09 MED ORDER — ALBUTEROL SULFATE (2.5 MG/3ML) 0.083% IN NEBU
2.5000 mg | INHALATION_SOLUTION | Freq: Four times a day (QID) | RESPIRATORY_TRACT | Status: DC | PRN
  Filled 2021-12-09: qty 3

## 2021-12-09 MED ORDER — LORATADINE 10 MG PO TABS
10.0000 mg | ORAL_TABLET | Freq: Every day | ORAL | Status: DC
  Administered 2021-12-09 – 2021-12-10 (×2): 10 mg via ORAL
  Filled 2021-12-09 (×2): qty 1

## 2021-12-09 MED ORDER — FOLIC ACID 1 MG PO TABS: 1.0000 mg | ORAL_TABLET | Freq: Every day | ORAL | Status: DC

## 2021-12-09 MED ORDER — ALBUTEROL SULFATE HFA 108 (90 BASE) MCG/ACT IN AERS: 2.0000 | INHALATION_SPRAY | Freq: Four times a day (QID) | RESPIRATORY_TRACT | Status: DC | PRN

## 2021-12-09 MED ORDER — ASPIRIN EC 81 MG PO TBEC
81.0000 mg | DELAYED_RELEASE_TABLET | Freq: Every day | ORAL | Status: DC
  Administered 2021-12-09 – 2021-12-10 (×2): 81 mg via ORAL
  Filled 2021-12-09 (×2): qty 1

## 2021-12-09 MED ORDER — CHLORDIAZEPOXIDE HCL 25 MG PO CAPS: ORAL_CAPSULE | ORAL | 0 refills | Status: AC

## 2021-12-09 MED ORDER — SERTRALINE HCL 100 MG PO TABS
100.0000 mg | ORAL_TABLET | Freq: Every day | ORAL | Status: DC
  Administered 2021-12-09 – 2021-12-10 (×2): 100 mg via ORAL
  Filled 2021-12-09 (×2): qty 1

## 2021-12-09 MED ORDER — FUROSEMIDE 20 MG PO TABS
40.0000 mg | ORAL_TABLET | Freq: Every day | ORAL | Status: DC
  Administered 2021-12-09 – 2021-12-10 (×2): 40 mg via ORAL
  Filled 2021-12-09 (×2): qty 2

## 2021-12-09 MED ORDER — LOSARTAN POTASSIUM 50 MG PO TABS
50.0000 mg | ORAL_TABLET | Freq: Every day | ORAL | Status: DC
  Administered 2021-12-09: 50 mg via ORAL
  Filled 2021-12-09 (×2): qty 1

## 2021-12-09 MED ORDER — FOLIC ACID 1 MG PO TABS
1.0000 mg | ORAL_TABLET | Freq: Every day | ORAL | Status: DC
  Administered 2021-12-09: 1 mg via ORAL
  Filled 2021-12-09: qty 1

## 2021-12-09 MED ORDER — THIAMINE HCL 100 MG PO TABS
100.0000 mg | ORAL_TABLET | Freq: Once | ORAL | Status: AC
  Administered 2021-12-09: 100 mg via ORAL
  Filled 2021-12-09: qty 1

## 2021-12-09 MED ORDER — LORAZEPAM 1 MG PO TABS: 0.0000 mg | ORAL_TABLET | Freq: Two times a day (BID) | ORAL | Status: DC

## 2021-12-09 MED ORDER — NICOTINE 21 MG/24HR TD PT24: 21.0000 mg | MEDICATED_PATCH | Freq: Every day | TRANSDERMAL | Status: DC

## 2021-12-09 MED ORDER — ASPIRIN EC 81 MG PO TBEC
81.0000 mg | DELAYED_RELEASE_TABLET | Freq: Every day | ORAL | Status: DC
Start: 2021-12-09 — End: 2021-12-09

## 2021-12-09 MED ORDER — LORAZEPAM 1 MG PO TABS
0.0000 mg | ORAL_TABLET | Freq: Four times a day (QID) | ORAL | Status: DC
  Administered 2021-12-10: 1 mg via ORAL
  Filled 2021-12-09: qty 1

## 2021-12-09 MED ORDER — LORAZEPAM 2 MG/ML IJ SOLN: 0.0000 mg | Freq: Two times a day (BID) | INTRAMUSCULAR | Status: DC

## 2021-12-09 MED ORDER — CYCLOBENZAPRINE HCL 10 MG PO TABS: 10.0000 mg | ORAL_TABLET | Freq: Three times a day (TID) | ORAL | Status: DC | PRN

## 2021-12-09 MED ORDER — FUROSEMIDE 20 MG PO TABS: 40.0000 mg | ORAL_TABLET | Freq: Every day | ORAL | Status: DC

## 2021-12-09 MED ORDER — THIAMINE HCL 100 MG PO TABS
100.0000 mg | ORAL_TABLET | Freq: Every day | ORAL | Status: DC
Start: 2021-12-09 — End: 2021-12-11
  Administered 2021-12-09 – 2021-12-10 (×2): 100 mg via ORAL
  Filled 2021-12-09 (×2): qty 1

## 2021-12-09 MED ORDER — FLUTICASONE PROPIONATE 50 MCG/ACT NA SUSP
2.0000 | Freq: Every day | NASAL | Status: DC
  Administered 2021-12-10: 2 via NASAL
  Filled 2021-12-09: qty 16

## 2021-12-09 MED ORDER — CHLORDIAZEPOXIDE HCL 25 MG PO CAPS
50.0000 mg | ORAL_CAPSULE | Freq: Once | ORAL | Status: AC
  Administered 2021-12-09: 50 mg via ORAL
  Filled 2021-12-09: qty 2

## 2021-12-09 MED ORDER — MAGNESIUM OXIDE -MG SUPPLEMENT 400 (240 MG) MG PO TABS
200.0000 mg | ORAL_TABLET | Freq: Two times a day (BID) | ORAL | Status: DC
  Administered 2021-12-10: 200 mg via ORAL
  Filled 2021-12-09: qty 1

## 2021-12-09 MED ORDER — FOLIC ACID 1 MG PO TABS
1.0000 mg | ORAL_TABLET | Freq: Once | ORAL | Status: AC
  Administered 2021-12-09: 1 mg via ORAL
  Filled 2021-12-09: qty 1

## 2021-12-09 MED ORDER — ROSUVASTATIN CALCIUM 5 MG PO TABS
10.0000 mg | ORAL_TABLET | Freq: Every day | ORAL | Status: DC
  Administered 2021-12-09 – 2021-12-10 (×2): 10 mg via ORAL
  Filled 2021-12-09 (×2): qty 2

## 2021-12-09 MED ORDER — GABAPENTIN 300 MG PO CAPS
300.0000 mg | ORAL_CAPSULE | Freq: Three times a day (TID) | ORAL | Status: DC
Start: 2021-12-09 — End: 2021-12-11
  Administered 2021-12-09 – 2021-12-10 (×2): 300 mg via ORAL
  Filled 2021-12-09 (×2): qty 1

## 2021-12-09 MED ORDER — MOMETASONE FURO-FORMOTEROL FUM 200-5 MCG/ACT IN AERO
2.0000 | INHALATION_SPRAY | Freq: Two times a day (BID) | RESPIRATORY_TRACT | Status: DC
  Administered 2021-12-10: 2 via RESPIRATORY_TRACT
  Filled 2021-12-09: qty 8.8

## 2021-12-09 MED ORDER — GABAPENTIN 300 MG PO CAPS: 300.0000 mg | ORAL_CAPSULE | Freq: Three times a day (TID) | ORAL | Status: DC

## 2021-12-09 MED ORDER — LORATADINE 10 MG PO TABS
10.0000 mg | ORAL_TABLET | Freq: Every day | ORAL | Status: DC
Start: 2021-12-09 — End: 2021-12-09

## 2021-12-09 MED ORDER — THIAMINE HCL 100 MG PO TABS: 100.0000 mg | ORAL_TABLET | Freq: Every day | ORAL | Status: DC

## 2021-12-09 NOTE — Discharge Instructions (Addendum)
As discussed, today's medical evaluation has been generally reassuring.  There is some suspicion for your chronic respiratory dysfunction and alcohol abuse contributing to your illness.  Please use the provided resources to obtain assistance with alcohol addiction.  Follow up with your physician regardless.  Return here for concerning changes in your condition.  [facility] [place]  [phone]  [facility] [place]  [phone]  [facility] [place]  [phone]  [place], [place], [place] [address] [phone]  [address] [place] [address], [place]  [phone]  [facility]  [address], [place], [place] [phone]  [facility] [address] [place]  [phone]  [facility] [place], [address], [place], [place] [address] [phone] [facility] [place], [place], [place] [address] [phone] [place] [address], [place], [place] [address]  [phone] [place] [address] [place],  [address]  [phone] [facility] Account Representative   [facility]: [phone]  e: [email or web address]

## 2021-12-09 NOTE — Progress Notes (Signed)
Transition of Care [facility]) - Emergency Department Mini Assessment   Patient Details  Name: Elijah Barton MRN: [id number] Date of Birth: 1956-07-31  Transition of Care [facility]) CM/SW Contact:    Fuller Mandril, RN Phone Number: 12/09/2021, 3:24 PM   Clinical Narrative:  Jason Coop. Clydene Laming, RN, BSN, [place] [phone] RNCM spoke with pt at bedside regarding discharge planning for [place]. Offered pt medicare.gov list of home health agencies to choose from.  Pt chose [facility] to render services. Stacie of [facility] notified. Patient made aware that [facility] will be in contact in 24-48 hours.  No DME needs identified at this time.   ED Mini Assessment: What brought you to the Emergency Department? : shortness of breath  Barriers to Discharge: ED Unsafe disposition  Barrier interventions: Home with home health services through [facility].  Rusty Aus, liaison accepted referral  Means of departure: Car  Interventions which prevented an admission or readmission: [place] or Services    Patient Contact and Communications        ,          Patient states their goals for this hospitalization and ongoing recovery are:: (P) Go home and get stronger      Admission diagnosis:  sob Patient Active Problem List   Diagnosis Date Noted   COPD with acute exacerbation ([place]) 10/04/2021   Chronic bronchitis ([place]) 07/22/2020   Back spasm 11/27/2018   Hypoxia 09/19/2017   Notalgia 11/16/2015   Alcohol abuse, daily use 10/12/2014   COPD exacerbation ([place]) 09/02/2013   BMI 37.0-37.9, adult 09/02/2013   Nicotine addiction 09/02/2013   Peripheral neuropathy 03/12/2013   PCP:  Maximiano Coss, NP Pharmacy:   [facility] - [place], [place] - [address] [place] [address] Phone: [phone] Fax: [phone]  [facility] [address] [place] [address] Phone: [phone]  Fax: [phone]

## 2021-12-09 NOTE — ED Provider Notes (Signed)
Case manager has talked to patient and he does not feel like he can go home.  He is insistent on going to a SNF.  TOC is recommending PT eval and he will have to board.   Sherwood Gambler, MD 12/09/21 [phone]

## 2021-12-09 NOTE — Care Management (Signed)
ED RNCM met with patient at bedside to further discuss transitions of care.  RNCM reviewed options for transitional care plan options HHvs. SNF. Patient original stated that SNF was not an option.  But after RN went back in patient now is requesting SNF placement.  TOC will review record and will do a work up for SNF placement. PT eval requested.  TOC will continue to follow for safe discharge from  ED.

## 2021-12-09 NOTE — ED Notes (Signed)
Got patient undressed into a gown on the monitor did ekg shown to Dr Vanita Panda pateint is resting with call bell in reach

## 2021-12-09 NOTE — NC FL2 (Signed)
[place] LEVEL OF CARE SCREENING TOOL     IDENTIFICATION  Patient Name: Elijah Barton Birthdate: [birth year] Sex: male Admission Date (Current Location): 12/09/2021  [facility] and [place] Number:  Herbalist and Address:         Provider Number: [id number]  Attending Physician Name and Address:  Default, Provider, MD  Relative Name and Phone Number:  Pauline Good Significant other [phone]    Current Level of Care: Hospital Recommended Level of Care: [place] Prior Approval Number:    Date Approved/Denied:   PASRR Number: [id number]  Discharge Plan: SNF    Current Diagnoses: Patient Active Problem List   Diagnosis Date Noted   COPD with acute exacerbation ([place]) 10/04/2021   Chronic bronchitis ([place]) 07/22/2020   Back spasm 11/27/2018   Hypoxia 09/19/2017   Notalgia 11/16/2015   Alcohol abuse, daily use 10/12/2014   COPD exacerbation ([place]) 09/02/2013   BMI 37.0-37.9, adult 09/02/2013   Nicotine addiction 09/02/2013   Peripheral neuropathy 03/12/2013    Orientation RESPIRATION BLADDER Height & Weight     Self, Time, Situation, Place  O2 (2l constant) Continent Weight: (!) 325 lb (147.4 kg) Height:  6\' 1"  (185.4 cm)  BEHAVIORAL SYMPTOMS/MOOD NEUROLOGICAL BOWEL NUTRITION STATUS      Continent Diet (Heart Healthy)  AMBULATORY STATUS COMMUNICATION OF NEEDS Skin   Limited Assist Verbally Normal                       Personal Care Assistance Level of Assistance  Bathing, Feeding, Dressing Bathing Assistance: Limited assistance Feeding assistance: Independent Dressing Assistance: Limited assistance     Functional Limitations Info  Sight, Hearing, Speech Sight Info: Impaired (wears glasses) Hearing Info: Adequate Speech Info: Adequate    SPECIAL CARE FACTORS FREQUENCY  PT (By licensed PT), OT (By licensed OT)     PT Frequency: 5 x weekly OT Frequency: 5 x weekly            Contractures Contractures  Info: Not present    Additional Factors Info  Code Status, Allergies Code Status Info: Full Allergies Info: Penicillins           Current Medications (12/09/2021):  This is the current hospital active medication list Current Facility-Administered Medications  Medication Dose Route Frequency Provider Last Rate Last Admin   albuterol (PROVENTIL) (2.5 MG/3ML) 0.083% nebulizer solution 2.5 mg  2.5 mg Nebulization Q6H PRN Carmin Muskrat, MD       aspirin EC tablet 81 mg  81 mg Oral Daily Carmin Muskrat, MD   81 mg at 12/09/21 1845   cyclobenzaprine (FLEXERIL) tablet 10 mg  10 mg Oral TID PRN Carmin Muskrat, MD       fluticasone Asencion Islam) 50 MCG/ACT nasal spray 2 spray  2 spray Each Nare Daily Carmin Muskrat, MD       folic acid (FOLVITE) tablet 1 mg  1 mg Oral Daily Carmin Muskrat, MD       furosemide (LASIX) tablet 40 mg  40 mg Oral Daily Carmin Muskrat, MD       gabapentin (NEURONTIN) capsule 300 mg  300 mg Oral TID Carmin Muskrat, MD       loratadine (CLARITIN) tablet 10 mg  10 mg Oral Daily Carmin Muskrat, MD       LORazepam (ATIVAN) injection 0-4 mg  0-4 mg Intravenous Q6H Sherwood Gambler, MD       Or   LORazepam (ATIVAN) tablet 0-4 mg  0-4 mg  Oral Q6H Sherwood Gambler, MD       Derrill Memo ON 12/12/2021] LORazepam (ATIVAN) injection 0-4 mg  0-4 mg Intravenous Q12H Sherwood Gambler, MD       Or   Derrill Memo ON 12/12/2021] LORazepam (ATIVAN) tablet 0-4 mg  0-4 mg Oral Q12H Sherwood Gambler, MD       losartan (COZAAR) tablet 50 mg  50 mg Oral Daily Carmin Muskrat, MD       mometasone-formoterol [facility]) 200-5 MCG/ACT inhaler 2 puff  2 puff Inhalation BID Carmin Muskrat, MD       rosuvastatin (CRESTOR) tablet 10 mg  10 mg Oral QHS Carmin Muskrat, MD       sertraline (ZOLOFT) tablet 100 mg  100 mg Oral Daily Carmin Muskrat, MD       thiamine tablet 100 mg  100 mg Oral Daily Carmin Muskrat, MD       Current Outpatient Medications  Medication Sig Dispense Refill   albuterol  (VENTOLIN HFA) 108 (90 Base) MCG/ACT inhaler Inhale 2 puffs into the lungs every 6 (six) hours as needed for wheezing or shortness of breath. 8 g 2   aspirin EC 81 MG tablet Take 1 tablet (81 mg total) by mouth daily. Swallow whole. 30 tablet 11   b complex vitamins tablet Take 1 tablet by mouth 3 (three) times a week.     budesonide-formoterol (SYMBICORT) 160-4.5 MCG/ACT inhaler Inhale 2 puffs into the lungs in the morning and at bedtime. 1 each 12   chlordiazePOXIDE (LIBRIUM) 25 MG capsule 50mg  PO TID x 1D, then 25-50mg  PO BID X 1D, then 25-50mg  PO QD X 1D 10 capsule 0   cholecalciferol (VITAMIN D3) 25 MCG (1000 UNIT) tablet Take 1,000 Units by mouth daily.     cyclobenzaprine (FLEXERIL) 10 MG tablet Take 1 tablet (10 mg total) by mouth 3 (three) times daily as needed for muscle spasms. 90 tablet 1   fexofenadine (ALLEGRA) 180 MG tablet Take 1 tablet (180 mg total) by mouth daily. 30 tablet 0   fluticasone (FLONASE) 50 MCG/ACT nasal spray Place 2 sprays into both nostrils daily. 16 g 6   folic acid (FOLVITE) 1 MG tablet Take 1 tablet (1 mg total) by mouth daily. 30 tablet 2   furosemide (LASIX) 20 MG tablet Take 2 tablets (40 mg total) by mouth daily. 30 tablet 3   gabapentin (NEURONTIN) 300 MG capsule Take 1 capsule (300 mg total) by mouth every morning AND 1 capsule (300 mg total) daily at 12 noon AND 2 capsules (600 mg total) at bedtime. 270 capsule 1   losartan (COZAAR) 50 MG tablet TAKE ONE TABLET BY MOUTH daily 90 tablet 3   naproxen (NAPROSYN) 500 MG tablet Take 500 mg by mouth 2 (two) times daily.     rosuvastatin (CRESTOR) 10 MG tablet Take 1 tablet (10 mg total) by mouth at bedtime. 90 tablet 0   sertraline (ZOLOFT) 100 MG tablet Take 1 tablet (100 mg total) by mouth daily. 90 tablet 1   thiamine 100 MG tablet Take 1 tablet (100 mg total) by mouth daily. 30 tablet 2   Tiotropium Bromide Monohydrate (SPIRIVA RESPIMAT) 2.5 MCG/ACT AERS Inhale 2 puffs into the lungs daily. 8 g 3   Coenzyme  Q10 (CO Q 10 PO) Take 1 tablet by mouth daily. (Patient not taking: Reported on 12/09/2021)     doxycycline (VIBRA-TABS) 100 MG tablet Take 1 tablet (100 mg total) by mouth 2 (two) times daily. (Patient not taking: Reported on 12/09/2021)  20 tablet 0   TURMERIC PO Take 1 tablet by mouth 2 (two) times daily. (Patient not taking: Reported on 12/09/2021)       Discharge Medications: Please see discharge summary for a list of discharge medications.  Relevant Imaging Results:  Relevant Lab Results:   Additional Information SSN# [id number]/ Pt has had 2 Covid shots/ Ht and wt 6'1" 325 lbs  Cliffie Gingras, LCSW

## 2021-12-09 NOTE — ED Notes (Signed)
Patient's significant other, Cecille Rubin, has spoke to this RN several times today. Pt and SO insist on further medical treatment. The Pt has changed his mind several times throughout the day about where he would like to go. Insisting on treatments that has not been ordered.This RN informs them that the Pt has been medically clear by the morning MD Vanita Panda. This RN asks Dr. Regenia Skeeter to speak with family- he speaks to [place] on the phone.

## 2021-12-09 NOTE — ED Triage Notes (Signed)
Patient presents to ED via [facility] states  he had been c/o sob for several weeks states he has had a productive cough, states he really hasn't been feeling well for about a year. States he was supposed to have started taking antibiotics last week however never started taking the medication. States he has been very depressed because his girlfriend  fell and broke her leg and is currently in rehab.

## 2021-12-09 NOTE — ED Notes (Signed)
The patient stated that he feels unsafe going home. Patient insists he cannot take care of himself, cannot walk, and 'I am an alcoholic'. SW Mariann Laster comes to speak with patient- he agrees to home health at this time. This RN returns to patient to gather information for transport home. The patient is now insisting on going to SNF. SW updated on this and SNF placement will be considered.

## 2021-12-09 NOTE — Progress Notes (Signed)
CSW met with Pt at bedside to discuss SNF process. CSW explained possible barriers to SNF placement including insurance issues.  Pt still waiting on PT consult. Pt aware that PT may recommend HH over SNF.   Referrals sent to SNFs who accept [employer].

## 2021-12-09 NOTE — ED Notes (Signed)
Lab to add on magnesium  

## 2021-12-09 NOTE — ED Provider Notes (Addendum)
[facility] EMERGENCY DEPARTMENT Provider Note   CSN: [id number] Arrival date & time: 12/09/21  0827     History  Chief Complaint  Patient presents with   Shortness of Breath    Elijah Barton is a 66 y.o. male.  HPI Elijah Barton is a 66 y/o M with a hx of alcohol dependence, COPD, HTN, tobacco use, and fatty liver who presents to the ED for [facility]. Pt reports he typically drinks ~1 pint of vodka a day "to get through the day" and decided to quit drinking last night. Last drink at 2100 yesterday evening. Pt states he wishes to quit and would like to seek help regarding his alcohol use. Pt states he typically has tremors in his R hand, but has had increased b/l arm tremors since he stopped drinking. He also reports nausea without vomiting and diffuse abd pain. No diarrhea or confusion. Pt recalls recent life stressors since his girlfriend has been placed in a nursing facility. Since then he has had decreased PO intake secondary to not having an appetite. Due to recent stressors pt states he has stopped taking his medications except Gabapentin. Pt also notes having an productive cough with SOB and generalized malaise for the last 3-4 days. Denies any sore throat or ear pain. Pt states his b/l legs have been more swollen than normal, but denies any unilateral edema or pain. Of note, he has chronic venous stasis changes and denies any recent wounds or open areas over the last couple of days. Pt denies any CP, new back or neck pain, new LE edema, or any other medical complaints.    Home Medications Prior to Admission medications   Medication Sig Start Date End Date Taking? Authorizing Provider  albuterol (VENTOLIN HFA) 108 (90 Base) MCG/ACT inhaler Inhale 2 puffs into the lungs every 6 (six) hours as needed for wheezing or shortness of breath. 11/11/21   Maximiano Coss, NP  aspirin EC 81 MG tablet Take 1 tablet (81 mg total) by mouth daily. Swallow whole. 08/20/21   Tolia, Sunit,  DO  b complex vitamins tablet Take 1 tablet by mouth 3 (three) times a week.    [provider]  budesonide-formoterol (SYMBICORT) 160-4.5 MCG/ACT inhaler Inhale 2 puffs into the lungs in the morning and at bedtime. 10/15/21   Maximiano Coss, NP  cholecalciferol (VITAMIN D3) 25 MCG (1000 UNIT) tablet Take 1,000 Units by mouth daily.    [provider]  Coenzyme Q10 (CO Q 10 PO) Take 1 tablet by mouth daily.    [provider]  cyclobenzaprine (FLEXERIL) 10 MG tablet Take 1 tablet (10 mg total) by mouth 3 (three) times daily as needed for muscle spasms. 11/11/21   Maximiano Coss, NP  doxycycline (VIBRA-TABS) 100 MG tablet Take 1 tablet (100 mg total) by mouth 2 (two) times daily. 11/11/21   Maximiano Coss, NP  fexofenadine (ALLEGRA) 180 MG tablet Take 1 tablet (180 mg total) by mouth daily. 10/25/21   Billie Ruddy, MD  fluticasone (FLONASE) 50 MCG/ACT nasal spray Place 2 sprays into both nostrils daily. 11/11/21   Maximiano Coss, NP  folic acid (FOLVITE) 1 MG tablet Take 1 tablet (1 mg total) by mouth daily. 10/12/21   Maximiano Coss, NP  furosemide (LASIX) 20 MG tablet Take 2 tablets (40 mg total) by mouth daily. 11/11/21   Maximiano Coss, NP  gabapentin (NEURONTIN) 300 MG capsule Take 1 capsule (300 mg total) by mouth every morning AND 1 capsule (300 mg  total) daily at 12 noon AND 2 capsules (600 mg total) at bedtime. 07/14/21   Maximiano Coss, NP  losartan (COZAAR) 50 MG tablet TAKE ONE TABLET BY MOUTH daily 10/22/21   Maximiano Coss, NP  rosuvastatin (CRESTOR) 10 MG tablet Take 1 tablet (10 mg total) by mouth at bedtime. 08/20/21 11/18/21  Tolia, Sunit, DO  sertraline (ZOLOFT) 100 MG tablet Take 1 tablet (100 mg total) by mouth daily. 12/02/21   Maximiano Coss, NP  thiamine 100 MG tablet Take 1 tablet (100 mg total) by mouth daily. 10/12/21   Maximiano Coss, NP  Tiotropium Bromide Monohydrate (SPIRIVA RESPIMAT) 2.5 MCG/ACT AERS Inhale 2 puffs into the lungs daily.  11/11/21   Maximiano Coss, NP  TURMERIC PO Take 1 tablet by mouth 2 (two) times daily.    [provider]      Allergies    Penicillins    Review of Systems   Review of Systems  Constitutional:        Per HPI, otherwise negative  HENT:         Per HPI, otherwise negative  Respiratory:         Per HPI, otherwise negative  Cardiovascular:        Per HPI, otherwise negative  Gastrointestinal:  Negative for vomiting.  Endocrine:       Negative aside from HPI  Genitourinary:        Neg aside from HPI   Musculoskeletal:        Per HPI, otherwise negative  Skin: Negative.   Neurological:  Negative for syncope.   Physical Exam Updated Vital Signs BP (!) 179/93    Pulse 81    Temp 98.3 F (36.8 C) (Oral)    Resp 19    Ht 6\' 1"  (1.854 m)    Wt (!) 147.4 kg    SpO2 100%    BMI 42.88 kg/m  Physical Exam Vitals and nursing note reviewed.  Constitutional:      General: He is not in acute distress.    Appearance: He is well-developed.  HENT:     Head: Normocephalic and atraumatic.  Eyes:     Conjunctiva/sclera: Conjunctivae normal.  Cardiovascular:     Rate and Rhythm: Normal rate and regular rhythm.  Pulmonary:     Effort: Pulmonary effort is normal. No respiratory distress.     Breath sounds: No stridor.  Abdominal:     General: There is no distension.     Tenderness: There is abdominal tenderness in the right upper quadrant and epigastric area.  Skin:    General: Skin is warm and dry.  Neurological:     Mental Status: He is alert and oriented to person, place, and time.    ED Results / Procedures / Treatments   Labs (all labs ordered are listed, but only abnormal results are displayed) Labs Reviewed  COMPREHENSIVE METABOLIC PANEL - Abnormal; Notable for the following components:      Result Value   Chloride 93 (*)    Glucose, Bld 136 (*)    BUN 5 (*)    AST 79 (*)    ALT 69 (*)    Total Bilirubin 1.4 (*)    All other components within normal limits   CBC WITH DIFFERENTIAL/PLATELET - Abnormal; Notable for the following components:   MCV 103.5 (*)    MCH 36.2 (*)    Platelets 112 (*)    All other components within normal limits  MAGNESIUM - Abnormal; Notable for  the following components:   Magnesium 1.5 (*)    All other components within normal limits  RESP PANEL BY RT-PCR (FLU A&B, COVID) [id number]  ETHANOL  LIPASE, BLOOD    EKG EKG Interpretation  Date/Time:  Thursday December 09 2021 08:35:18 EST Ventricular Rate:  90 PR Interval:  187 QRS Duration: 100 QT Interval:  360 QTC Calculation: 441 R Axis:   -80 Text Interpretation: Sinus rhythm Incomplete RBBB and LAFB RSR' in V1 or V2, right VCD or RVH Artifact Abnormal ECG Confirmed by Carmin Muskrat [phone]) on 12/09/2021 1:04:00 PM  Radiology DG Chest 2 View  Result Date: 12/09/2021 CLINICAL DATA:  Shortness of breath and cough for several days, history smoking, COPD, hypertension, alcohol and substance abuse EXAM: CHEST - 2 VIEW COMPARISON:  10/04/2021 FINDINGS: Upper normal size of cardiac silhouette. Mediastinal contours and pulmonary vascularity normal. Atherosclerotic calcification aorta. Bibasilar atelectasis. No acute infiltrate, pleural effusion, or pneumothorax. Osseous structures unremarkable. IMPRESSION: Bibasilar atelectasis. Aortic Atherosclerosis (ICD10-I70.0). Electronically Signed   By: Lavonia Dana M.D.   On: 12/09/2021 09:36   US Abdomen Limited  Result Date: 12/09/2021 CLINICAL DATA:  Right upper quadrant pain. EXAM: ULTRASOUND ABDOMEN LIMITED RIGHT UPPER QUADRANT COMPARISON:  Ultrasound 10/05/2021 FINDINGS: Gallbladder: No gallstones or wall thickening visualized. No sonographic Murphy sign noted by sonographer. Common bile duct: Not visualized. Liver: Diffuse increased echogenicity and attenuation of the liver which limits evaluation for small lesions. No focal liver abnormality. Portal vein is patent on color Doppler imaging with normal direction of blood flow  towards the liver. Other: None. IMPRESSION: 1. Hepatic steatosis. 2. No sonographic evidence of cholecystitis. 3. Common bile duct is not visualized, however discrete intrahepatic bile duct dilatation noted. Electronically Signed   By: Ileana Roup M.D.   On: 12/09/2021 11:43    Procedures Procedures    Medications Ordered in ED Medications  chlordiazePOXIDE (LIBRIUM) capsule 50 mg (has no administration in time range)  thiamine tablet 100 mg (100 mg Oral Given 0/98/11 9147)  folic acid (FOLVITE) tablet 1 mg (1 mg Oral Given 12/09/21 1135)  LORazepam (ATIVAN) injection 1 mg (1 mg Intravenous Given 12/09/21 1135)    ED Course/ Medical Decision Making/ A&P  This patient presents to the ED for concern of DVT, shortness of breath, this involves an extensive number of treatment options, and is a complaint that carries with it a high risk of complications and morbidity.  The differential diagnosis includes pneumonia, electrolyte abnormalities, bronchitis, COPD.  Co morbidities that complicate the patient evaluation  Alcohol abuse, cigarette abuse   Social Determinants of Health:  Cigarette addiction, alcohol abuse   Additional history obtained:  Additional history and/or information obtained from chart review External records from outside source obtained and reviewed including fatigue, shortness of breath primary care visit last week about depression   After the initial evaluation, orders, including: Labs, x-ray were initiated.  Patient placed on Cardiac and Pulse-Oximetry Monitors. The patient was maintained on a cardiac monitor.  The cardiac monitored showed an rhythm of sinus 70 unremarkable The patient was also maintained on pulse oximetry. The readings were typically 99% room air normal  On repeat evaluation of the patient improved  Lab Tests:  I personally interpreted labs.  The pertinent results include: Reassuring labs in general, though with mild elevation in hepatic  enzymes, and noted history of fatty liver disease, ultrasound indicated  Imaging Studies ordered:  I independently visualized and interpreted imaging which showed persistent hepatosteatosis I agree with the radiologist interpretation  Dispostion / Final MDM:  After consideration of the diagnostic results and the patient's response to treatment, he is appropriate for discharge.  We discussed his presentation at length, and he is amenable to assistance with alcohol addiction, he received Ativan and Librium to start in the ED, we discharged with same in an ongoing manner.  He has a outpatient team, nurse practitioner Maura with whom he can follow-up to facilitate this, and was provided additional resources.  Here, though is noted to have multiple medical issues including COPD, alcohol addiction, there is no evidence for acute new phenomena as above, no evidence of bacteremia, sepsis or pneumonia, without hypoxia, increased work of breathing, substantial hepatobiliary dysfunction, COVID, influenza, the patient is appropriate for discharge, with close outpatient follow-up, ongoing controlled substance for facilitation of alcohol cessation..  After discussion with nursing staff about the patient's presentation, with lodgment of his partners not being available to assist him with home health, social work consult has been placed.  I discussed his presentation with our colleagues, and efforts for home health services have been initiated. Final Clinical Impression(s) / ED Diagnoses Final diagnoses:  SOB (shortness of breath)  Weakness  Alcohol dependence with other alcohol-induced disorder [facility])    Rx / DC Orders ED Discharge Orders          Ordered    chlordiazePOXIDE (LIBRIUM) 25 MG capsule        12/09/21 1509              Carmin Muskrat, MD 12/09/21 1510    Carmin Muskrat, MD 12/09/21 [phone]

## 2021-12-10 ENCOUNTER — Telehealth: Payer: Self-pay

## 2021-12-10 NOTE — Progress Notes (Signed)
CSW met with Pt to review the fact that PT assessment states Home Health recommendation and therefore his insurance will not pay for SNF. CSW offered list of in-home care options that he would have to hire.  CSW states that he feels that he is being forced out of the hospital and that he cannot care for himself in his home.  CSW discussed the fact that Pt has daughter on chart and asked Pt if he was connected with a faith community. Pt affirms that he is connected with several faith communities.  CSW discussed the option of assisted living with Pt. CSW explained that Pt would need to pay out of pocket and would receive a monthly allowance of $33. CSW explained that assisted living is a an alternative for people who cannot care for themselves at home.  Pt adamantly declined assisted living placement .  CSW updated charge nurse. CSW updated [facility].

## 2021-12-10 NOTE — Telephone Encounter (Signed)
Caller name:Jovanie Eulas Post   On DPR? :Yes  Call back number:[phone]   Provider they see: Delfino Lovett  Reason for call:Pt came in being unsteady on legs, trouble breathing pt was in the hospital for two days he is being released and said he is still unsteady on his legs, diarrhea  as well. Pt is wanting home health I will schedule HFU

## 2021-12-10 NOTE — ED Notes (Signed)
PTAR called again to put back on list

## 2021-12-10 NOTE — Telephone Encounter (Signed)
Reason for call:Pt came in being unsteady on legs, trouble breathing pt was in the hospital for two days he is being released and said he is still unsteady on his legs, diarrhea  as well. Pt is wanting home health I will schedule HFU

## 2021-12-10 NOTE — ED Notes (Signed)
Pauline Good [phone] requesting to speak to a nurse or doctor about the patient

## 2021-12-10 NOTE — Discharge Planning (Signed)
Pt chooses to go home with home health services. TOC will provide rolling walker for gait stability as recommended by PT.  Lolly Glaus J. Clydene Laming, RN, BSN, NCM  Transitions of Care   Nurse Case Manager  [facility] Emergency Departments   Operative Services  [phone]

## 2021-12-10 NOTE — Telephone Encounter (Signed)
Noted. Will see him on Monday  Thanks  Rich

## 2021-12-10 NOTE — Evaluation (Signed)
Physical Therapy Evaluation Patient Details Name: Elijah Barton MRN: [id number] DOB: [birth year] Today's Date: 12/10/2021  History of Present Illness  Pt is a 66 y/o male presenting to ED 12/09/21 for general malaise and SOB. PMH includes alcohol dependence, COPD, HTN, venous stasis, tobacco use, and 2L O2 at home.  Clinical Impression  Patient presented to the ED on 12/09/21 with general malaise and SOB. Pt's impairments include LE weakness and deficits in power, cardiopulmonary conditioning, and balance. These are limiting his ability to safely and independently perform functional mobility at home. Patient requires min guard to min assist with all functional mobility. Pt able to walk to the bathroom in room on RA, however, did desat to 88%. Immediately placed on 2L O2 and and SpO2 returned to greater than 90%. RN notified. Pt prefers to return home and declines rehab at a SNF. SPT recommending home health services and a RW upon D/C due to lack of support at home and impairments listed above. PT will continue to follow acutely.        Recommendations for follow up therapy are one component of a multi-disciplinary discharge planning process, led by the attending physician.  Recommendations may be updated based on patient status, additional functional criteria and insurance authorization.  Follow Up Recommendations Home health PT (home health OT, home health aide,)    Assistance Recommended at Discharge Frequent or constant Supervision/Assistance  Patient can return home with the following  A little help with walking and/or transfers;A little help with bathing/dressing/bathroom;Assistance with cooking/housework;Direct supervision/assist for medications management;Direct supervision/assist for financial management;Assist for transportation;Help with stairs or ramp for entrance    Equipment Recommendations Rolling walker (2 wheels)  Recommendations for Other Services       Functional Status  Assessment Patient has had a recent decline in their functional status and demonstrates the ability to make significant improvements in function in a reasonable and predictable amount of time.     Precautions / Restrictions Precautions Precautions: Fall Restrictions Weight Bearing Restrictions: No      Mobility  Bed Mobility Overal bed mobility: Needs Assistance Bed Mobility: Supine to Sit, Sit to Supine     Supine to sit: Min guard Sit to supine: Min guard   General bed mobility comments: min guard A for safety during transitional movement. Pt verbally telling SPT not to help him reporting "I can do it"    Transfers Overall transfer level: Needs assistance Equipment used: None Transfers: Sit to/from Stand Sit to Stand: Min guard           General transfer comment: min guard A for safety due to pt sway and unsteadiness in standing.    Ambulation/Gait Ambulation/Gait assistance: Min guard, Min assist Gait Distance (Feet): 15 Feet Assistive device: None Gait Pattern/deviations: Step-through pattern, Narrow base of support, Antalgic Gait velocity: decreased Gait velocity interpretation: <1.8 ft/sec, indicate of risk for recurrent falls Pre-gait activities: marching General Gait Details: sways laterally with ambulation and needs min gaurd A for safety. Pt hit LLE against chair in room when turning into bathroom and had a his most significant LOB; min A for steadying with mild LOB with turns. Mininmal foot clearance with gait. SPT educated pt decreasing speed with turns and using a walker initially for safety at home. Pt had increased RR and increased fatigue with ambulating performed to the bathroom and back  Stairs            Wheelchair Mobility    Modified Rankin (Stroke Patients Only)  Balance Overall balance assessment: Needs assistance Sitting-balance support: No upper extremity supported, Feet supported, Bilateral upper extremity supported Sitting  balance-Leahy Scale: Fair Sitting balance - Comments: initially increased shakiness and unsteadiness in sitting that quickly diminished. Pt reports no dizziness.   Standing balance support: No upper extremity supported Standing balance-Leahy Scale: Fair Standing balance comment: reports weakness and shakiness of his legs with standing. Has some unsteadyness initially.                             Pertinent Vitals/Pain Pain Assessment Pain Assessment: Faces Faces Pain Scale: No hurt    Home Living Family/patient expects to be discharged to:: Private residence Living Arrangements: Spouse/significant other;Alone (SO in hospital currently for medical complications) Available Help at Discharge: Friend(s) Type of Home: House Home Access: Stairs to enter Entrance Stairs-Rails: Right;Left;Can reach both Entrance Stairs-Number of Steps: 5   Home Layout: One level Home Equipment: Grab bars - toilet;Grab bars - tub/shower;Shower Land (2 wheels);Cane - single point Additional Comments: RW is his SO's and may be available at D/C, but not certain. Reports friends unable to help PRN while SO in rehab.    Prior Function Prior Level of Function : Needs assist;History of Falls (last six months)       Physical Assist : Mobility (physical);ADLs (physical) Mobility (physical): Stairs ADLs (physical): Bathing;Dressing;IADLs Mobility Comments: occasionally uses cane/walker; has had couple of falls usually due to alcohol use, but once coming into the house caught foot on the lip and fell on R knee. Normally navigates stairs sideways with 2 hands on railing with SO present to assist as needed. ADLs Comments: pt reports girlfriend helps bathe, dress, and cook at home for him. Uses delivery services for food and groceries.     Hand Dominance   Dominant Hand: Right    Extremity/Trunk Assessment   Upper Extremity Assessment Upper Extremity Assessment: Generalized weakness     Lower Extremity Assessment Lower Extremity Assessment: Generalized weakness (increased shakiness with functional mobility)    Cervical / Trunk Assessment Cervical / Trunk Assessment: Kyphotic  Communication   Communication: No difficulties  Cognition Arousal/Alertness: Awake/alert Behavior During Therapy: WFL for tasks assessed/performed Overall Cognitive Status: No family/caregiver present to determine baseline cognitive functioning                                          General Comments General comments (skin integrity, edema, etc.): desat with 15 feet of ambulation on RA from 94% to 88%. Placed on 2L O2 upon return to bed. Quickly returned to greater than 90% with 2L. RN notified    Exercises Other Exercises Other Exercises: sit to stands (5 reps performed with increased RR)   Assessment/Plan    PT Assessment Patient needs continued PT services  PT Problem List Decreased strength;Decreased activity tolerance;Decreased balance;Decreased mobility;Decreased coordination;Decreased knowledge of use of DME;Decreased safety awareness       PT Treatment Interventions Gait training;Stair training;Functional mobility training;Therapeutic activities;Therapeutic exercise;Balance training;Neuromuscular re-education;Patient/family education;DME instruction    PT Goals (Current goals can be found in the Care Plan section)  Acute Rehab PT Goals Patient Stated Goal: to go home and take care of his pets PT Goal Formulation: With patient Time For Goal Achievement: 12/17/21 Potential to Achieve Goals: Good    Frequency Min 3X/week     Co-evaluation  AM-PAC PT "6 Clicks" Mobility  Outcome Measure Help needed turning from your back to your side while in a flat bed without using bedrails?: None Help needed moving from lying on your back to sitting on the side of a flat bed without using bedrails?: A Little Help needed moving to and from a bed to a  chair (including a wheelchair)?: A Little Help needed standing up from a chair using your arms (e.g., wheelchair or bedside chair)?: A Little Help needed to walk in hospital room?: A Little Help needed climbing 3-5 steps with a railing? : A Lot 6 Click Score: 18    End of Session Equipment Utilized During Treatment: Gait belt;Oxygen Activity Tolerance: Patient tolerated treatment well Patient left: in bed;with call bell/phone within reach (on stretcher in ED) Nurse Communication: Mobility status PT Visit Diagnosis: Unsteadiness on feet (R26.81);Other abnormalities of gait and mobility (R26.89);History of falling (Z91.81);Muscle weakness (generalized) (M62.81);Difficulty in walking, not elsewhere classified (R26.2)    Time: 0916-1000 PT Time Calculation (min) (ACUTE ONLY): 44 min   Charges:   PT Evaluation $PT Eval Moderate Complexity: 1 Mod PT Treatments $Gait Training: 8-22 mins $Therapeutic Activity: 8-22 mins        Jonne Ply, SPT  [place] 12/10/2021, 11:03 AM

## 2021-12-10 NOTE — ED Notes (Signed)
Significant other, Pauline Good [phone] requesting to speak to doctor immediately

## 2021-12-10 NOTE — ED Notes (Addendum)
Pt assisted to bedside commode - pt is two person assist, unsteady on feet, and needs assistance with hygiene - pt had BM

## 2021-12-10 NOTE — ED Notes (Addendum)
Pt assisted with getting cleaned up. Pt given all overdue medications. Pt given sadnwich bag and coffee per request. [facility] called for update related to transport home. Pt called his wife and update given. Pt given warm blankets x2. IV removed per pt request. No acute changes noted. Will continue to monitor. Pt sitting up on end of stretcher, eating and talking with his wife on the phone.

## 2021-12-10 NOTE — ED Notes (Signed)
[facility] called to transport patient  

## 2021-12-11 DIAGNOSIS — Z743 Need for continuous supervision: Secondary | ICD-10-CM | POA: Diagnosis not present

## 2021-12-11 DIAGNOSIS — R531 Weakness: Secondary | ICD-10-CM | POA: Diagnosis not present

## 2021-12-13 ENCOUNTER — Telehealth (INDEPENDENT_AMBULATORY_CARE_PROVIDER_SITE_OTHER): Payer: Medicare HMO | Admitting: Registered Nurse

## 2021-12-13 ENCOUNTER — Encounter: Payer: Self-pay | Admitting: Registered Nurse

## 2021-12-13 ENCOUNTER — Other Ambulatory Visit: Payer: Self-pay

## 2021-12-13 VITALS — BP 130/90 | Wt 325.0 lb

## 2021-12-13 DIAGNOSIS — I2584 Coronary atherosclerosis due to calcified coronary lesion: Secondary | ICD-10-CM

## 2021-12-13 DIAGNOSIS — J22 Unspecified acute lower respiratory infection: Secondary | ICD-10-CM

## 2021-12-13 DIAGNOSIS — I700 Atherosclerosis of aorta: Secondary | ICD-10-CM | POA: Diagnosis not present

## 2021-12-13 DIAGNOSIS — E559 Vitamin D deficiency, unspecified: Secondary | ICD-10-CM

## 2021-12-13 DIAGNOSIS — F1010 Alcohol abuse, uncomplicated: Secondary | ICD-10-CM | POA: Diagnosis not present

## 2021-12-13 DIAGNOSIS — R69 Illness, unspecified: Secondary | ICD-10-CM | POA: Diagnosis not present

## 2021-12-13 DIAGNOSIS — K591 Functional diarrhea: Secondary | ICD-10-CM | POA: Diagnosis not present

## 2021-12-13 DIAGNOSIS — I2510 Atherosclerotic heart disease of native coronary artery without angina pectoris: Secondary | ICD-10-CM | POA: Diagnosis not present

## 2021-12-13 MED ORDER — FOLIC ACID 1 MG PO TABS: 1.0000 mg | ORAL_TABLET | Freq: Every day | ORAL | 3 refills | Status: AC

## 2021-12-13 MED ORDER — SPIRIVA RESPIMAT 2.5 MCG/ACT IN AERS: 2.0000 | INHALATION_SPRAY | Freq: Every day | RESPIRATORY_TRACT | 3 refills | Status: AC

## 2021-12-13 MED ORDER — LOPERAMIDE HCL 2 MG PO CAPS: 2.0000 mg | ORAL_CAPSULE | Freq: Three times a day (TID) | ORAL | 0 refills | Status: AC | PRN

## 2021-12-13 MED ORDER — LOSARTAN POTASSIUM 50 MG PO TABS: 50.0000 mg | ORAL_TABLET | Freq: Every day | ORAL | 3 refills | Status: AC

## 2021-12-13 MED ORDER — ROSUVASTATIN CALCIUM 10 MG PO TABS: 10.0000 mg | ORAL_TABLET | Freq: Every day | ORAL | 3 refills | Status: AC

## 2021-12-13 MED ORDER — VITAMIN D 25 MCG (1000 UNIT) PO TABS: 1000.0000 [IU] | ORAL_TABLET | Freq: Every day | ORAL | 3 refills | Status: AC

## 2021-12-13 MED ORDER — BUDESONIDE-FORMOTEROL FUMARATE 160-4.5 MCG/ACT IN AERO: 2.0000 | INHALATION_SPRAY | Freq: Two times a day (BID) | RESPIRATORY_TRACT | 12 refills | Status: AC

## 2021-12-13 MED ORDER — VITAMIN B COMPLEX PO TABS: 1.0000 | ORAL_TABLET | Freq: Every day | ORAL | 3 refills | Status: AC

## 2021-12-13 NOTE — Progress Notes (Signed)
Telemedicine Encounter- SOAP NOTE Established Patient  This telephone encounter was conducted with the patient's (or proxy's) verbal consent via audio telecommunications: yes/no: Yes Patient was instructed to have this encounter in a suitably private space; and to only have persons present to whom they give permission to participate. In addition, patient identity was confirmed by use of name plus two identifiers (DOB and address).  I discussed the limitations, risks, security and privacy concerns of performing an evaluation and management service by telephone and the availability of in person appointments. I also discussed with the patient that there may be a patient responsible charge related to this service. The patient expressed understanding and agreed to proceed.  I spent a total of 25 minutes talking with the patient or their proxy.  Patient at home Provider in office  Participants: Kathrin Ruddy, NP and Haig Prophet  Chief Complaint  Patient presents with   Hospitalization Follow-up    Patient states he is states he was following up from recent hospital visit. Patient states he was dehydrated , trembling and feeling like he was going to fall and break something.    Subjective   Elijah Barton is a 66 y.o. established patient. Telephone visit today for hfu  HPI Had instability in his legs, diaphoresis, falls Called EMS, concern for MI, taken to ER.  He had had nausea without vomiting, severe diarrhea.  He was given IV fluids and stabilized. Dc to home - had had discussions regarding SNF, but not eligible per pt. He wanted to be home to ensure he could continue to pay bills.  He is extremely dissatisfied with the care he received when in the hospital. He is upset with CC of shortness of breath - states he has been at baseline. He is also upset with discussions of SNF - states he was mislead to believe it would be free of charge before being told it would cost him essentially  his entire income.   He notes he has not been on his medication for 1 week at this time. He is looking to restart.  He denies SI.  Patient Active Problem List   Diagnosis Date Noted   COPD with acute exacerbation ([place]) 10/04/2021   Chronic bronchitis ([place]) 07/22/2020   Back spasm 11/27/2018   Hypoxia 09/19/2017   Notalgia 11/16/2015   Alcohol abuse, daily use 10/12/2014   COPD exacerbation ([place]) 09/02/2013   BMI 37.0-37.9, adult 09/02/2013   Nicotine addiction 09/02/2013   Peripheral neuropathy 03/12/2013    Past Medical History:  Diagnosis Date   Alcohol abuse    Allergies    Arthritis    Back pain    Cataracts, bilateral    COPD (chronic obstructive pulmonary disease) (HCC)    Fatty liver    Glaucoma    History of depression    Hypertension    Joint pain    all over except for hip   Peripheral neuropathy    Substance abuse ([place])     Current Outpatient Medications  Medication Sig Dispense Refill   B Complex Vitamins (VITAMIN B COMPLEX) TABS Take 1 tablet by mouth daily. 90 tablet 3   loperamide (IMODIUM) 2 MG capsule Take 1 capsule (2 mg total) by mouth 3 (three) times daily as needed for diarrhea or loose stools. 30 capsule 0   albuterol (VENTOLIN HFA) 108 (90 Base) MCG/ACT inhaler Inhale 2 puffs into the lungs every 6 (six) hours as needed for wheezing or shortness of breath. 8 g  2   aspirin EC 81 MG tablet Take 1 tablet (81 mg total) by mouth daily. Swallow whole. 30 tablet 11   budesonide-formoterol (SYMBICORT) 160-4.5 MCG/ACT inhaler Inhale 2 puffs into the lungs in the morning and at bedtime. 2 each 12   chlordiazePOXIDE (LIBRIUM) 25 MG capsule 50mg  PO TID x 1D, then 25-50mg  PO BID X 1D, then 25-50mg  PO QD X 1D 10 capsule 0   cholecalciferol (VITAMIN D3) 25 MCG (1000 UNIT) tablet Take 1 tablet (1,000 Units total) by mouth daily. 90 tablet 3   Coenzyme Q10 (CO Q 10 PO) Take 1 tablet by mouth daily. (Patient not taking: Reported on 12/09/2021)     cyclobenzaprine  (FLEXERIL) 10 MG tablet Take 1 tablet (10 mg total) by mouth 3 (three) times daily as needed for muscle spasms. 90 tablet 1   doxycycline (VIBRA-TABS) 100 MG tablet Take 1 tablet (100 mg total) by mouth 2 (two) times daily. (Patient not taking: Reported on 12/09/2021) 20 tablet 0   fexofenadine (ALLEGRA) 180 MG tablet Take 1 tablet (180 mg total) by mouth daily. 30 tablet 0   fluticasone (FLONASE) 50 MCG/ACT nasal spray Place 2 sprays into both nostrils daily. 16 g 6   folic acid (FOLVITE) 1 MG tablet Take 1 tablet (1 mg total) by mouth daily. 90 tablet 3   furosemide (LASIX) 20 MG tablet Take 2 tablets (40 mg total) by mouth daily. 30 tablet 3   gabapentin (NEURONTIN) 300 MG capsule Take 1 capsule (300 mg total) by mouth every morning AND 1 capsule (300 mg total) daily at 12 noon AND 2 capsules (600 mg total) at bedtime. 270 capsule 1   losartan (COZAAR) 50 MG tablet Take 1 tablet (50 mg total) by mouth daily. 90 tablet 3   naproxen (NAPROSYN) 500 MG tablet Take 500 mg by mouth 2 (two) times daily.     rosuvastatin (CRESTOR) 10 MG tablet Take 1 tablet (10 mg total) by mouth at bedtime. 90 tablet 3   sertraline (ZOLOFT) 100 MG tablet Take 1 tablet (100 mg total) by mouth daily. 90 tablet 1   thiamine 100 MG tablet Take 1 tablet (100 mg total) by mouth daily. 30 tablet 2   Tiotropium Bromide Monohydrate (SPIRIVA RESPIMAT) 2.5 MCG/ACT AERS Inhale 2 puffs into the lungs daily. 12 g 3   TURMERIC PO Take 1 tablet by mouth 2 (two) times daily. (Patient not taking: Reported on 12/09/2021)     No current facility-administered medications for this visit.    Allergies  Allergen Reactions   Penicillins Other (See Comments)    Childhood Allergy Has patient had a PCN reaction causing immediate rash, facial/tongue/throat swelling, SOB or lightheadedness with hypotension: Unknown Has patient had a PCN reaction causing severe rash involving mucus membranes or skin necrosis: Unknown Has patient had a PCN  reaction that required hospitalization: Unknown Has patient had a PCN reaction occurring within the last 10 years: Unknown If all of the above answers are "NO", then may proceed with Cephalosporin use.     Social History   Socioeconomic History   Marital status: Divorced    Spouse name: n/a   Number of children: 0   Years of education: Not on file   Highest education level: Not on file  Occupational History   Occupation: Armed forces technical officer    Employer: [employer]  Tobacco Use   Smoking status: Every Day    Packs/day: 3.00    Years: 40.00  Pack years: 120.00    Types: Cigarettes   Smokeless tobacco: Never  Vaping Use   Vaping Use: Former  Substance and Sexual Activity   Alcohol use: Yes    Alcohol/week: 15.0 standard drinks    Types: 15 Standard drinks or equivalent per week    Comment: 0-20 drinks/week. Pint of Liquor day    Drug use: Yes    Types: Marijuana   Sexual activity: Yes    Birth control/protection: Condom  Other Topics Concern   Not on file  Social History Narrative   Lives with his girlfriend with his 2 cats. Mother died in 03/16/13 in [place], [place] in her home with Hospice.  Daughter lives in [place], [place].   Social Determinants of Health   Financial Resource Strain: Not on file  Food Insecurity: Not on file  Transportation Needs: Not on file  Physical Activity: Not on file  Stress: Not on file  Social Connections: Not on file  Intimate Partner Violence: Not on file    ROS Per hpi   Objective   Vitals as reported by the patient: Today's Vitals   12/13/21 1219  BP: 130/90  SpO2: 95%  Weight: (!) 325 lb (147.4 kg)    Whit was seen today for hospitalization follow-up.  Diagnoses and all orders for this visit:  Functional diarrhea -     loperamide (IMODIUM) 2 MG capsule; Take 1 capsule (2 mg total) by mouth 3 (three) times daily as needed for diarrhea or loose stools.  Lower respiratory infection -     Tiotropium Bromide  Monohydrate (SPIRIVA RESPIMAT) 2.5 MCG/ACT AERS; Inhale 2 puffs into the lungs daily. -     budesonide-formoterol (SYMBICORT) 160-4.5 MCG/ACT inhaler; Inhale 2 puffs into the lungs in the morning and at bedtime.  Alcohol abuse -     folic acid (FOLVITE) 1 MG tablet; Take 1 tablet (1 mg total) by mouth daily. -     B Complex Vitamins (VITAMIN B COMPLEX) TABS; Take 1 tablet by mouth daily.  Atherosclerosis of aorta (HCC) -     rosuvastatin (CRESTOR) 10 MG tablet; Take 1 tablet (10 mg total) by mouth at bedtime. -     losartan (COZAAR) 50 MG tablet; Take 1 tablet (50 mg total) by mouth daily.  Coronary atherosclerosis due to calcified coronary lesion -     rosuvastatin (CRESTOR) 10 MG tablet; Take 1 tablet (10 mg total) by mouth at bedtime. -     losartan (COZAAR) 50 MG tablet; Take 1 tablet (50 mg total) by mouth daily.  Vitamin D deficiency -     cholecalciferol (VITAMIN D3) 25 MCG (1000 UNIT) tablet; Take 1 tablet (1,000 Units total) by mouth daily.    PLAN Discussed safe use of medications with patient Encouraged compliance with meds for best effect Return for in office visit at appropriate interval Patient encouraged to call clinic with any questions, comments, or concerns.  I discussed the assessment and treatment plan with the patient. The patient was provided an opportunity to ask questions and all were answered. The patient agreed with the plan and demonstrated an understanding of the instructions.   The patient was advised to call back or seek an in-person evaluation if the symptoms worsen or if the condition fails to improve as anticipated.  I provided 25 minutes of non-face-to-face time during this encounter.  Maximiano Coss, NP

## 2021-12-13 NOTE — Patient Instructions (Signed)
° ° ° °  If you have lab work done today you will be contacted with your lab results within the next 2 weeks.  If you have not heard from us then please contact us. The fastest way to get your results is to register for My Chart. ° ° °IF you received an x-ray today, you will receive an invoice from [facility]. Please contact [facility] at [phone] with questions or concerns regarding your invoice.  ° °IF you received labwork today, you will receive an invoice from [facility]. Please contact [facility] at [phone] with questions or concerns regarding your invoice.  ° °Our billing staff will not be able to assist you with questions regarding bills from these companies. ° °You will be contacted with the lab results as soon as they are available. The fastest way to get your results is to activate your My Chart account. Instructions are located on the last page of this paperwork. If you have not heard from us regarding the results in 2 weeks, please contact this office. °  ° ° ° °

## 2021-12-14 ENCOUNTER — Ambulatory Visit: Payer: Medicare HMO | Admitting: Registered Nurse

## 2021-12-16 ENCOUNTER — Telehealth: Payer: Self-pay | Admitting: Registered Nurse

## 2021-12-16 ENCOUNTER — Encounter: Payer: Self-pay | Admitting: Registered Nurse

## 2021-12-16 NOTE — Telephone Encounter (Signed)
Kern Reap from [employer] called on behalf of Elijah Barton in regards to med stiriva sp, that is the longer coverage, and the alternate is incruse. She states that they either need to update the pharmacy for incruse or call this phone number for exception [phone] ?  ?Please Advice ?  ?Thank You ?

## 2021-12-16 NOTE — Telephone Encounter (Signed)
Kern Reap from [employer] called on behalf of Elijah Barton in regards to med stiriva sp, that is the longer coverage, and the alternate is incruse. She states that they either need to update the pharmacy for incruse or call this phone number for exception [phone] ? ?Please Advice ? ?Thank You ?

## 2021-12-17 ENCOUNTER — Other Ambulatory Visit: Payer: Self-pay | Admitting: Registered Nurse

## 2021-12-17 DIAGNOSIS — J449 Chronic obstructive pulmonary disease, unspecified: Secondary | ICD-10-CM

## 2021-12-17 MED ORDER — INCRUSE ELLIPTA 62.5 MCG/ACT IN AEPB: 1.0000 | INHALATION_SPRAY | Freq: Every day | RESPIRATORY_TRACT | 11 refills | Status: AC

## 2021-12-17 NOTE — Telephone Encounter (Signed)
Sent incruse ? ?Thanks, ? ?Rich

## 2021-12-23 ENCOUNTER — Telehealth: Payer: Self-pay

## 2021-12-23 DIAGNOSIS — R69 Illness, unspecified: Secondary | ICD-10-CM | POA: Diagnosis not present

## 2021-12-23 DIAGNOSIS — J449 Chronic obstructive pulmonary disease, unspecified: Secondary | ICD-10-CM | POA: Diagnosis not present

## 2021-12-23 DIAGNOSIS — K760 Fatty (change of) liver, not elsewhere classified: Secondary | ICD-10-CM | POA: Diagnosis not present

## 2021-12-23 DIAGNOSIS — G629 Polyneuropathy, unspecified: Secondary | ICD-10-CM | POA: Diagnosis not present

## 2021-12-23 DIAGNOSIS — Z9981 Dependence on supplemental oxygen: Secondary | ICD-10-CM | POA: Diagnosis not present

## 2021-12-23 DIAGNOSIS — Z9181 History of falling: Secondary | ICD-10-CM | POA: Diagnosis not present

## 2021-12-23 DIAGNOSIS — J441 Chronic obstructive pulmonary disease with (acute) exacerbation: Secondary | ICD-10-CM | POA: Diagnosis not present

## 2021-12-23 NOTE — Telephone Encounter (Signed)
[facility]  ? ?On DPR? :Yes ? ?Call back number:[phone] ? ?Provider they see: Delfino Lovett  ? ?Reason for call:[facility] needs verbal orders  ?Skilled Nursing  1 week 1 ?2 every other week 4 ?1 Prn visit as well  ? ? ?

## 2021-12-24 DIAGNOSIS — J441 Chronic obstructive pulmonary disease with (acute) exacerbation: Secondary | ICD-10-CM | POA: Diagnosis not present

## 2021-12-24 DIAGNOSIS — J42 Unspecified chronic bronchitis: Secondary | ICD-10-CM | POA: Diagnosis not present

## 2021-12-24 NOTE — Telephone Encounter (Signed)
Called back verbals given  ?

## 2021-12-28 NOTE — Telephone Encounter (Signed)
Thank you,  Rich

## 2021-12-30 DIAGNOSIS — J449 Chronic obstructive pulmonary disease, unspecified: Secondary | ICD-10-CM | POA: Diagnosis not present

## 2021-12-30 DIAGNOSIS — G629 Polyneuropathy, unspecified: Secondary | ICD-10-CM | POA: Diagnosis not present

## 2021-12-30 DIAGNOSIS — K760 Fatty (change of) liver, not elsewhere classified: Secondary | ICD-10-CM | POA: Diagnosis not present

## 2021-12-30 DIAGNOSIS — J441 Chronic obstructive pulmonary disease with (acute) exacerbation: Secondary | ICD-10-CM | POA: Diagnosis not present

## 2021-12-30 DIAGNOSIS — Z9981 Dependence on supplemental oxygen: Secondary | ICD-10-CM | POA: Diagnosis not present

## 2021-12-30 DIAGNOSIS — R69 Illness, unspecified: Secondary | ICD-10-CM | POA: Diagnosis not present

## 2021-12-30 DIAGNOSIS — Z9181 History of falling: Secondary | ICD-10-CM | POA: Diagnosis not present

## 2021-12-31 ENCOUNTER — Telehealth: Payer: Self-pay | Admitting: *Deleted

## 2021-12-31 DIAGNOSIS — R69 Illness, unspecified: Secondary | ICD-10-CM | POA: Diagnosis not present

## 2021-12-31 DIAGNOSIS — Z9181 History of falling: Secondary | ICD-10-CM | POA: Diagnosis not present

## 2021-12-31 DIAGNOSIS — K760 Fatty (change of) liver, not elsewhere classified: Secondary | ICD-10-CM | POA: Diagnosis not present

## 2021-12-31 DIAGNOSIS — G629 Polyneuropathy, unspecified: Secondary | ICD-10-CM | POA: Diagnosis not present

## 2021-12-31 DIAGNOSIS — J441 Chronic obstructive pulmonary disease with (acute) exacerbation: Secondary | ICD-10-CM | POA: Diagnosis not present

## 2021-12-31 DIAGNOSIS — J449 Chronic obstructive pulmonary disease, unspecified: Secondary | ICD-10-CM | POA: Diagnosis not present

## 2021-12-31 DIAGNOSIS — Z9981 Dependence on supplemental oxygen: Secondary | ICD-10-CM | POA: Diagnosis not present

## 2021-12-31 NOTE — Telephone Encounter (Signed)
Caller name:[facility]  ?  ?On DPR? :Yes ?  ?Call back [phone] ?  ?Provider they see: Delfino Lovett  ?  ?Reason for call: ?[facility] needs verbal orders  ?PT 1x for 8 weeks ? ?Can leave voicemail with orders if approved.  ?

## 2021-12-31 NOTE — Telephone Encounter (Signed)
Called and LM with verbal orders  ?

## 2022-01-03 DIAGNOSIS — Z9981 Dependence on supplemental oxygen: Secondary | ICD-10-CM | POA: Diagnosis not present

## 2022-01-03 DIAGNOSIS — G629 Polyneuropathy, unspecified: Secondary | ICD-10-CM | POA: Diagnosis not present

## 2022-01-03 DIAGNOSIS — K760 Fatty (change of) liver, not elsewhere classified: Secondary | ICD-10-CM | POA: Diagnosis not present

## 2022-01-03 DIAGNOSIS — J449 Chronic obstructive pulmonary disease, unspecified: Secondary | ICD-10-CM | POA: Diagnosis not present

## 2022-01-03 DIAGNOSIS — J441 Chronic obstructive pulmonary disease with (acute) exacerbation: Secondary | ICD-10-CM | POA: Diagnosis not present

## 2022-01-03 DIAGNOSIS — Z9181 History of falling: Secondary | ICD-10-CM | POA: Diagnosis not present

## 2022-01-03 DIAGNOSIS — R69 Illness, unspecified: Secondary | ICD-10-CM | POA: Diagnosis not present

## 2022-01-04 ENCOUNTER — Telehealth: Payer: Self-pay

## 2022-01-04 NOTE — Telephone Encounter (Signed)
Errpr

## 2022-01-05 DIAGNOSIS — Z9181 History of falling: Secondary | ICD-10-CM | POA: Diagnosis not present

## 2022-01-05 DIAGNOSIS — K760 Fatty (change of) liver, not elsewhere classified: Secondary | ICD-10-CM | POA: Diagnosis not present

## 2022-01-05 DIAGNOSIS — Z9981 Dependence on supplemental oxygen: Secondary | ICD-10-CM | POA: Diagnosis not present

## 2022-01-05 DIAGNOSIS — R0902 Hypoxemia: Secondary | ICD-10-CM | POA: Diagnosis not present

## 2022-01-05 DIAGNOSIS — R69 Illness, unspecified: Secondary | ICD-10-CM | POA: Diagnosis not present

## 2022-01-05 DIAGNOSIS — G629 Polyneuropathy, unspecified: Secondary | ICD-10-CM | POA: Diagnosis not present

## 2022-01-05 DIAGNOSIS — J441 Chronic obstructive pulmonary disease with (acute) exacerbation: Secondary | ICD-10-CM | POA: Diagnosis not present

## 2022-01-05 DIAGNOSIS — J449 Chronic obstructive pulmonary disease, unspecified: Secondary | ICD-10-CM | POA: Diagnosis not present

## 2022-01-06 DIAGNOSIS — R69 Illness, unspecified: Secondary | ICD-10-CM | POA: Diagnosis not present

## 2022-01-06 DIAGNOSIS — J449 Chronic obstructive pulmonary disease, unspecified: Secondary | ICD-10-CM | POA: Diagnosis not present

## 2022-01-06 DIAGNOSIS — G629 Polyneuropathy, unspecified: Secondary | ICD-10-CM | POA: Diagnosis not present

## 2022-01-06 DIAGNOSIS — K760 Fatty (change of) liver, not elsewhere classified: Secondary | ICD-10-CM | POA: Diagnosis not present

## 2022-01-06 DIAGNOSIS — J441 Chronic obstructive pulmonary disease with (acute) exacerbation: Secondary | ICD-10-CM | POA: Diagnosis not present

## 2022-01-06 DIAGNOSIS — Z9981 Dependence on supplemental oxygen: Secondary | ICD-10-CM | POA: Diagnosis not present

## 2022-01-06 DIAGNOSIS — Z9181 History of falling: Secondary | ICD-10-CM | POA: Diagnosis not present

## 2022-01-10 DIAGNOSIS — J441 Chronic obstructive pulmonary disease with (acute) exacerbation: Secondary | ICD-10-CM | POA: Diagnosis not present

## 2022-01-10 DIAGNOSIS — Z9181 History of falling: Secondary | ICD-10-CM | POA: Diagnosis not present

## 2022-01-10 DIAGNOSIS — J449 Chronic obstructive pulmonary disease, unspecified: Secondary | ICD-10-CM | POA: Diagnosis not present

## 2022-01-10 DIAGNOSIS — G629 Polyneuropathy, unspecified: Secondary | ICD-10-CM | POA: Diagnosis not present

## 2022-01-10 DIAGNOSIS — K760 Fatty (change of) liver, not elsewhere classified: Secondary | ICD-10-CM | POA: Diagnosis not present

## 2022-01-10 DIAGNOSIS — R69 Illness, unspecified: Secondary | ICD-10-CM | POA: Diagnosis not present

## 2022-01-10 DIAGNOSIS — Z9981 Dependence on supplemental oxygen: Secondary | ICD-10-CM | POA: Diagnosis not present

## 2022-01-12 DIAGNOSIS — R69 Illness, unspecified: Secondary | ICD-10-CM | POA: Diagnosis not present

## 2022-01-12 DIAGNOSIS — J449 Chronic obstructive pulmonary disease, unspecified: Secondary | ICD-10-CM | POA: Diagnosis not present

## 2022-01-12 DIAGNOSIS — G629 Polyneuropathy, unspecified: Secondary | ICD-10-CM | POA: Diagnosis not present

## 2022-01-12 DIAGNOSIS — Z9981 Dependence on supplemental oxygen: Secondary | ICD-10-CM | POA: Diagnosis not present

## 2022-01-12 DIAGNOSIS — K760 Fatty (change of) liver, not elsewhere classified: Secondary | ICD-10-CM | POA: Diagnosis not present

## 2022-01-12 DIAGNOSIS — F17200 Nicotine dependence, unspecified, uncomplicated: Secondary | ICD-10-CM | POA: Diagnosis not present

## 2022-01-12 DIAGNOSIS — F10288 Alcohol dependence with other alcohol-induced disorder: Secondary | ICD-10-CM | POA: Diagnosis not present

## 2022-01-12 DIAGNOSIS — J441 Chronic obstructive pulmonary disease with (acute) exacerbation: Secondary | ICD-10-CM | POA: Diagnosis not present

## 2022-01-12 DIAGNOSIS — Z9181 History of falling: Secondary | ICD-10-CM | POA: Diagnosis not present

## 2022-01-17 DIAGNOSIS — G629 Polyneuropathy, unspecified: Secondary | ICD-10-CM | POA: Diagnosis not present

## 2022-01-17 DIAGNOSIS — Z9181 History of falling: Secondary | ICD-10-CM | POA: Diagnosis not present

## 2022-01-17 DIAGNOSIS — J449 Chronic obstructive pulmonary disease, unspecified: Secondary | ICD-10-CM | POA: Diagnosis not present

## 2022-01-17 DIAGNOSIS — K760 Fatty (change of) liver, not elsewhere classified: Secondary | ICD-10-CM | POA: Diagnosis not present

## 2022-01-17 DIAGNOSIS — J441 Chronic obstructive pulmonary disease with (acute) exacerbation: Secondary | ICD-10-CM | POA: Diagnosis not present

## 2022-01-17 DIAGNOSIS — R69 Illness, unspecified: Secondary | ICD-10-CM | POA: Diagnosis not present

## 2022-01-17 DIAGNOSIS — Z9981 Dependence on supplemental oxygen: Secondary | ICD-10-CM | POA: Diagnosis not present

## 2022-01-18 ENCOUNTER — Telehealth: Payer: Self-pay | Admitting: Registered Nurse

## 2022-01-18 NOTE — Telephone Encounter (Signed)
Pt called in stating that he is having a hard time, his GF just came home and he this he has a cracked rib. He states that Delfino Lovett is his sounding voice and wanted to have a phone visit with him. Please advise if we can work pt in  ?

## 2022-01-18 NOTE — Telephone Encounter (Signed)
Per Maximiano Coss he is not able to double book at this time would have to wait for next appointment  ?

## 2022-01-18 NOTE — Telephone Encounter (Signed)
Patient states he will call back later to make an appointment.  ?

## 2022-01-19 DIAGNOSIS — K760 Fatty (change of) liver, not elsewhere classified: Secondary | ICD-10-CM | POA: Diagnosis not present

## 2022-01-19 DIAGNOSIS — Z9181 History of falling: Secondary | ICD-10-CM | POA: Diagnosis not present

## 2022-01-19 DIAGNOSIS — G629 Polyneuropathy, unspecified: Secondary | ICD-10-CM | POA: Diagnosis not present

## 2022-01-19 DIAGNOSIS — J449 Chronic obstructive pulmonary disease, unspecified: Secondary | ICD-10-CM | POA: Diagnosis not present

## 2022-01-19 DIAGNOSIS — R69 Illness, unspecified: Secondary | ICD-10-CM | POA: Diagnosis not present

## 2022-01-19 DIAGNOSIS — Z9981 Dependence on supplemental oxygen: Secondary | ICD-10-CM | POA: Diagnosis not present

## 2022-01-19 DIAGNOSIS — J441 Chronic obstructive pulmonary disease with (acute) exacerbation: Secondary | ICD-10-CM | POA: Diagnosis not present

## 2022-01-20 DIAGNOSIS — R69 Illness, unspecified: Secondary | ICD-10-CM | POA: Diagnosis not present

## 2022-01-20 DIAGNOSIS — K760 Fatty (change of) liver, not elsewhere classified: Secondary | ICD-10-CM | POA: Diagnosis not present

## 2022-01-20 DIAGNOSIS — J449 Chronic obstructive pulmonary disease, unspecified: Secondary | ICD-10-CM | POA: Diagnosis not present

## 2022-01-20 DIAGNOSIS — G629 Polyneuropathy, unspecified: Secondary | ICD-10-CM | POA: Diagnosis not present

## 2022-01-20 DIAGNOSIS — J441 Chronic obstructive pulmonary disease with (acute) exacerbation: Secondary | ICD-10-CM | POA: Diagnosis not present

## 2022-01-20 DIAGNOSIS — Z9181 History of falling: Secondary | ICD-10-CM | POA: Diagnosis not present

## 2022-01-20 DIAGNOSIS — Z9981 Dependence on supplemental oxygen: Secondary | ICD-10-CM | POA: Diagnosis not present

## 2022-01-22 DIAGNOSIS — J449 Chronic obstructive pulmonary disease, unspecified: Secondary | ICD-10-CM | POA: Diagnosis not present

## 2022-01-22 DIAGNOSIS — Z9181 History of falling: Secondary | ICD-10-CM | POA: Diagnosis not present

## 2022-01-22 DIAGNOSIS — K760 Fatty (change of) liver, not elsewhere classified: Secondary | ICD-10-CM | POA: Diagnosis not present

## 2022-01-22 DIAGNOSIS — G629 Polyneuropathy, unspecified: Secondary | ICD-10-CM | POA: Diagnosis not present

## 2022-01-22 DIAGNOSIS — J441 Chronic obstructive pulmonary disease with (acute) exacerbation: Secondary | ICD-10-CM | POA: Diagnosis not present

## 2022-01-22 DIAGNOSIS — R69 Illness, unspecified: Secondary | ICD-10-CM | POA: Diagnosis not present

## 2022-01-22 DIAGNOSIS — Z9981 Dependence on supplemental oxygen: Secondary | ICD-10-CM | POA: Diagnosis not present

## 2022-01-24 DIAGNOSIS — J42 Unspecified chronic bronchitis: Secondary | ICD-10-CM | POA: Diagnosis not present

## 2022-01-24 DIAGNOSIS — J441 Chronic obstructive pulmonary disease with (acute) exacerbation: Secondary | ICD-10-CM | POA: Diagnosis not present

## 2022-02-03 DIAGNOSIS — J42 Unspecified chronic bronchitis: Secondary | ICD-10-CM | POA: Diagnosis not present

## 2022-02-03 DIAGNOSIS — J441 Chronic obstructive pulmonary disease with (acute) exacerbation: Secondary | ICD-10-CM | POA: Diagnosis not present

## 2022-02-05 DIAGNOSIS — J441 Chronic obstructive pulmonary disease with (acute) exacerbation: Secondary | ICD-10-CM | POA: Diagnosis not present

## 2022-02-05 DIAGNOSIS — R0902 Hypoxemia: Secondary | ICD-10-CM | POA: Diagnosis not present

## 2022-02-08 ENCOUNTER — Encounter: Payer: Self-pay | Admitting: Registered Nurse

## 2022-02-08 ENCOUNTER — Telehealth (INDEPENDENT_AMBULATORY_CARE_PROVIDER_SITE_OTHER): Payer: Medicare HMO | Admitting: Registered Nurse

## 2022-02-08 ENCOUNTER — Other Ambulatory Visit: Payer: Self-pay

## 2022-02-08 DIAGNOSIS — F331 Major depressive disorder, recurrent, moderate: Secondary | ICD-10-CM

## 2022-02-08 DIAGNOSIS — R69 Illness, unspecified: Secondary | ICD-10-CM | POA: Diagnosis not present

## 2022-02-08 MED ORDER — SERTRALINE HCL 100 MG PO TABS: 150.0000 mg | ORAL_TABLET | Freq: Every day | ORAL | 1 refills | Status: AC

## 2022-02-08 NOTE — Progress Notes (Signed)
? ? ?Telemedicine Encounter- SOAP NOTE Established Patient ? ?This telephone encounter was conducted with the patient's (or proxy's) verbal consent via audio telecommunications: yes/no: Yes ?Patient was instructed to have this encounter in a suitably private space; and to only have persons present to whom they give permission to participate. In addition, patient identity was confirmed by use of name plus two identifiers (DOB and address).  I discussed the limitations, risks, security and privacy concerns of performing an evaluation and management service by telephone and the availability of in person appointments. I also discussed with the patient that there may be a patient responsible charge related to this service. The patient expressed understanding and agreed to proceed. ? ?I spent a total of 25 minutes talking with the patient or their proxy. ? ?Patient at home ?Provider in office ? ?Participants: Kathrin Ruddy, NP and Haig Prophet ? ?Chief Complaint  ?Patient presents with  ? Follow-up  ?  Pt would like to discuss his progress he has made   ? ? ?Subjective  ? ?Elijah Barton is a 66 y.o. established patient. Telephone visit today for med check ? ?HPI ?Notes his girlfriend got home from rehab about 3 weeks ago.  ?She has fallen twice - EMS had to come out once. She did suffer a wound from one of these falls. ? ?143/89 bp at home. Asymptomatic. He reports this is better than it has been, as he continues to cut down on salt and alcohol.  ? ?He has been noncompliant with zoloft, missing many days. Interested in getting more consistent. Notes subtherapeutic effect at '50mg'$  dose. Interested in getting to '100mg'$  po qd. ? ?Overall notes much improvement.  ? ?Patient Active Problem List  ? Diagnosis Date Noted  ? COPD with acute exacerbation ([place]) 10/04/2021  ? Chronic bronchitis ([place]) 07/22/2020  ? Back spasm 11/27/2018  ? Hypoxia 09/19/2017  ? Notalgia 11/16/2015  ? Alcohol abuse, daily use 10/12/2014  ? COPD  exacerbation ([place]) 09/02/2013  ? BMI 37.0-37.9, adult 09/02/2013  ? Nicotine addiction 09/02/2013  ? Peripheral neuropathy 03/12/2013  ? ? ?Past Medical History:  ?Diagnosis Date  ? Alcohol abuse   ? Allergies   ? Arthritis   ? Back pain   ? Cataracts, bilateral   ? COPD (chronic obstructive pulmonary disease) ([place])   ? Fatty liver   ? Glaucoma   ? History of depression   ? Hypertension   ? Joint pain   ? all over except for hip  ? Peripheral neuropathy   ? Substance abuse ([place])   ? ? ?Current Outpatient Medications  ?Medication Sig Dispense Refill  ? albuterol (VENTOLIN HFA) 108 (90 Base) MCG/ACT inhaler Inhale 2 puffs into the lungs every 6 (six) hours as needed for wheezing or shortness of breath. 8 g 2  ? aspirin EC 81 MG tablet Take 1 tablet (81 mg total) by mouth daily. Swallow whole. 30 tablet 11  ? B Complex Vitamins (VITAMIN B COMPLEX) TABS Take 1 tablet by mouth daily. 90 tablet 3  ? budesonide-formoterol (SYMBICORT) 160-4.5 MCG/ACT inhaler Inhale 2 puffs into the lungs in the morning and at bedtime. 2 each 12  ? chlordiazePOXIDE (LIBRIUM) 25 MG capsule '50mg'$  PO TID x 1D, then 25-'50mg'$  PO BID X 1D, then 25-'50mg'$  PO QD X 1D 10 capsule 0  ? cholecalciferol (VITAMIN D3) 25 MCG (1000 UNIT) tablet Take 1 tablet (1,000 Units total) by mouth daily. 90 tablet 3  ? Coenzyme Q10 (CO Q 10 PO) Take 1  tablet by mouth daily.    ? cyclobenzaprine (FLEXERIL) 10 MG tablet Take 1 tablet (10 mg total) by mouth 3 (three) times daily as needed for muscle spasms. 90 tablet 1  ? fexofenadine (ALLEGRA) 180 MG tablet Take 1 tablet (180 mg total) by mouth daily. 30 tablet 0  ? fluticasone (FLONASE) 50 MCG/ACT nasal spray Place 2 sprays into both nostrils daily. 16 g 6  ? folic acid (FOLVITE) 1 MG tablet Take 1 tablet (1 mg total) by mouth daily. 90 tablet 3  ? furosemide (LASIX) 20 MG tablet Take 2 tablets (40 mg total) by mouth daily. 30 tablet 3  ? gabapentin (NEURONTIN) 300 MG capsule Take 1 capsule (300 mg total) by mouth every  morning AND 1 capsule (300 mg total) daily at 12 noon AND 2 capsules (600 mg total) at bedtime. 270 capsule 1  ? loperamide (IMODIUM) 2 MG capsule Take 1 capsule (2 mg total) by mouth 3 (three) times daily as needed for diarrhea or loose stools. 30 capsule 0  ? losartan (COZAAR) 50 MG tablet Take 1 tablet (50 mg total) by mouth daily. 90 tablet 3  ? naproxen (NAPROSYN) 500 MG tablet Take 500 mg by mouth 2 (two) times daily.    ? rosuvastatin (CRESTOR) 10 MG tablet Take 1 tablet (10 mg total) by mouth at bedtime. 90 tablet 3  ? thiamine 100 MG tablet Take 1 tablet (100 mg total) by mouth daily. 30 tablet 2  ? Tiotropium Bromide Monohydrate (SPIRIVA RESPIMAT) 2.5 MCG/ACT AERS Inhale 2 puffs into the lungs daily. 12 g 3  ? TURMERIC PO Take 1 tablet by mouth 2 (two) times daily.    ? doxycycline (VIBRA-TABS) 100 MG tablet Take 1 tablet (100 mg total) by mouth 2 (two) times daily. (Patient not taking: Reported on 12/09/2021) 20 tablet 0  ? sertraline (ZOLOFT) 100 MG tablet Take 1.5 tablets (150 mg total) by mouth daily. 135 tablet 1  ? umeclidinium bromide (INCRUSE ELLIPTA) 62.5 MCG/ACT AEPB Inhale 1 puff into the lungs daily. (Patient not taking: Reported on 02/08/2022) 30 each 11  ? ?No current facility-administered medications for this visit.  ? ? ?Allergies  ?Allergen Reactions  ? Penicillins Other (See Comments)  ?  Childhood Allergy ?Has patient had a PCN reaction causing immediate rash, facial/tongue/throat swelling, SOB or lightheadedness with hypotension: Unknown ?Has patient had a PCN reaction causing severe rash involving mucus membranes or skin necrosis: Unknown ?Has patient had a PCN reaction that required hospitalization: Unknown ?Has patient had a PCN reaction occurring within the last 10 years: Unknown ?If all of the above answers are "NO", then may proceed with Cephalosporin use. ?  ? ? ?Social History  ? ?Socioeconomic History  ? Marital status: Divorced  ?  Spouse name: n/a  ? Number of children: 0  ?  Years of education: Not on file  ? Highest education level: Not on file  ?Occupational History  ? Occupation: Armed forces technical officer  ?  Employer: [employer]  ?Tobacco Use  ? Smoking status: Every Day  ?  Packs/day: 3.00  ?  Years: 40.00  ?  Pack years: 120.00  ?  Types: Cigarettes  ? Smokeless tobacco: Never  ?Vaping Use  ? Vaping Use: Former  ?Substance and Sexual Activity  ? Alcohol use: Yes  ?  Alcohol/week: 15.0 standard drinks  ?  Types: 15 Standard drinks or equivalent per week  ?  Comment: 0-20 drinks/week. Pint of Liquor day   ?  Drug use: Yes  ?  Types: Marijuana  ? Sexual activity: Yes  ?  Birth control/protection: Condom  ?Other Topics Concern  ? Not on file  ?Social History Narrative  ? Lives with his girlfriend with his 2 cats. Mother died in 2013-03-13 in [place], [place] in her home with Hospice.  Daughter lives in [place], [place].  ? ?Social Determinants of Health  ? ?Financial Resource Strain: Not on file  ?Food Insecurity: Not on file  ?Transportation Needs: Not on file  ?Physical Activity: Not on file  ?Stress: Not on file  ?Social Connections: Not on file  ?Intimate Partner Violence: Not on file  ? ? ?ROS ?Per hpi  ? ?Objective  ? ?Vitals as reported by the patient: ?Today's Vitals  ? 02/08/22 1128  ?Pulse: 80  ?Temp: 98.6 ?F (37 ?C)  ?TempSrc: Temporal  ?SpO2: 95%  ? ? ?Demetries was seen today for follow-up. ? ?Diagnoses and all orders for this visit: ? ?Moderate episode of recurrent major depressive disorder (HCC) ?-     sertraline (ZOLOFT) 100 MG tablet; Take 1.5 tablets (150 mg total) by mouth daily. ? ? ? ?PLAN ?Sertraline '100mg'$  po qd ?Med check in 6-8 weeks ?Patient encouraged to call clinic with any questions, comments, or concerns. ? ?I discussed the assessment and treatment plan with the patient. The patient was provided an opportunity to ask questions and all were answered. The patient agreed with the plan and demonstrated an understanding of the instructions. ?  ?The patient was advised  to call back or seek an in-person evaluation if the symptoms worsen or if the condition fails to improve as anticipated. ? ?I provided 25 minutes of non-face-to-face time during this encounter. ? ?Royden Purl

## 2022-02-23 DIAGNOSIS — J42 Unspecified chronic bronchitis: Secondary | ICD-10-CM | POA: Diagnosis not present

## 2022-02-23 DIAGNOSIS — J441 Chronic obstructive pulmonary disease with (acute) exacerbation: Secondary | ICD-10-CM | POA: Diagnosis not present

## 2022-02-24 ENCOUNTER — Ambulatory Visit: Payer: Medicare HMO

## 2022-03-03 ENCOUNTER — Ambulatory Visit (INDEPENDENT_AMBULATORY_CARE_PROVIDER_SITE_OTHER): Payer: Medicare HMO

## 2022-03-03 DIAGNOSIS — Z0000 Encounter for general adult medical examination without abnormal findings: Secondary | ICD-10-CM

## 2022-03-03 DIAGNOSIS — Z7189 Other specified counseling: Secondary | ICD-10-CM | POA: Insufficient documentation

## 2022-03-03 NOTE — Patient Instructions (Signed)
Elijah Barton , Thank you for taking time to come for your Medicare Wellness Visit. I appreciate your ongoing commitment to your health goals. Please review the following plan we discussed and let me know if I can assist you in the future.   Screening recommendations/referrals: Colonoscopy: due pt stated he will follow up at later date  Recommended yearly ophthalmology/optometry visit for glaucoma screening and checkup Recommended yearly dental visit for hygiene and checkup  Vaccinations: Influenza vaccine: due  Pneumococcal vaccine: due  Tdap vaccine: done 12/20/18 repeat every 10 years  Shingles vaccine: Shingrix discussed. Please contact your pharmacy for coverage information.    Covid-19: Completed 9/1, 07/22/20  Advanced directives: Advance directive discussed with you today. Even though you declined this today please call our office should you change your mind and we can give you the proper paperwork for you to fill out.  Conditions/risks identified: get better and stronger  to take care of girlfriend   Next appointment: Follow up in one year for your annual wellness visit.   Preventive Care 67 Years and Older, Male Preventive care refers to lifestyle choices and visits with your health care provider that can promote health and wellness. What does preventive care include? A yearly physical exam. This is also called an annual well check. Dental exams once or twice a year. Routine eye exams. Ask your health care provider how often you should have your eyes checked. Personal lifestyle choices, including: Daily care of your teeth and gums. Regular physical activity. Eating a healthy diet. Avoiding tobacco and drug use. Limiting alcohol use. Practicing safe sex. Taking low doses of aspirin every day. Taking vitamin and mineral supplements as recommended by your health care provider. What happens during an annual well check? The services and screenings done by your health care provider  during your annual well check will depend on your age, overall health, lifestyle risk factors, and family history of disease. Counseling  Your health care provider may ask you questions about your: Alcohol use. Tobacco use. Drug use. Emotional well-being. Home and relationship well-being. Sexual activity. Eating habits. History of falls. Memory and ability to understand (cognition). Work and work Statistician. Screening  You may have the following tests or measurements: Height, weight, and BMI. Blood pressure. Lipid and cholesterol levels. These may be checked every 5 years, or more frequently if you are over 63 years old. Skin check. Lung cancer screening. You may have this screening every year starting at age 68 if you have a 30-pack-year history of smoking and currently smoke or have quit within the past 15 years. Fecal occult blood test (FOBT) of the stool. You may have this test every year starting at age 15. Flexible sigmoidoscopy or colonoscopy. You may have a sigmoidoscopy every 5 years or a colonoscopy every 10 years starting at age 55. Prostate cancer screening. Recommendations will vary depending on your family history and other risks. Hepatitis C blood test. Hepatitis B blood test. Sexually transmitted disease (STD) testing. Diabetes screening. This is done by checking your blood sugar (glucose) after you have not eaten for a while (fasting). You may have this done every 1-3 years. Abdominal aortic aneurysm (AAA) screening. You may need this if you are a current or former smoker. Osteoporosis. You may be screened starting at age 48 if you are at high risk. Talk with your health care provider about your test results, treatment options, and if necessary, the need for more tests. Vaccines  Your health care provider may recommend  certain vaccines, such as: Influenza vaccine. This is recommended every year. Tetanus, diphtheria, and acellular pertussis (Tdap, Td) vaccine. You  may need a Td booster every 10 years. Zoster vaccine. You may need this after age 32. Pneumococcal 13-valent conjugate (PCV13) vaccine. One dose is recommended after age 85. Pneumococcal polysaccharide (PPSV23) vaccine. One dose is recommended after age 56. Talk to your health care provider about which screenings and vaccines you need and how often you need them. This information is not intended to replace advice given to you by your health care provider. Make sure you discuss any questions you have with your health care provider. Document Released: 10/30/2015 Document Revised: 06/22/2016 Document Reviewed: 08/04/2015 Elsevier Interactive Patient Education  2017 [place] Prevention in the Home Falls can cause injuries. They can happen to people of all ages. There are many things you can do to make your home safe and to help prevent falls. What can I do on the outside of my home? Regularly fix the edges of walkways and driveways and fix any cracks. Remove anything that might make you trip as you walk through a door, such as a raised step or threshold. Trim any bushes or trees on the path to your home. Use bright outdoor lighting. Clear any walking paths of anything that might make someone trip, such as rocks or tools. Regularly check to see if handrails are loose or broken. Make sure that both sides of any steps have handrails. Any raised decks and porches should have guardrails on the edges. Have any leaves, snow, or ice cleared regularly. Use sand or salt on walking paths during winter. Clean up any spills in your garage right away. This includes oil or grease spills. What can I do in the bathroom? Use night lights. Install grab bars by the toilet and in the tub and shower. Do not use towel bars as grab bars. Use non-skid mats or decals in the tub or shower. If you need to sit down in the shower, use a plastic, non-slip stool. Keep the floor dry. Clean up any water that spills  on the floor as soon as it happens. Remove soap buildup in the tub or shower regularly. Attach bath mats securely with double-sided non-slip rug tape. Do not have throw rugs and other things on the floor that can make you trip. What can I do in the bedroom? Use night lights. Make sure that you have a light by your bed that is easy to reach. Do not use any sheets or blankets that are too big for your bed. They should not hang down onto the floor. Have a firm chair that has side arms. You can use this for support while you get dressed. Do not have throw rugs and other things on the floor that can make you trip. What can I do in the kitchen? Clean up any spills right away. Avoid walking on wet floors. Keep items that you use a lot in easy-to-reach places. If you need to reach something above you, use a strong step stool that has a grab bar. Keep electrical cords out of the way. Do not use floor polish or wax that makes floors slippery. If you must use wax, use non-skid floor wax. Do not have throw rugs and other things on the floor that can make you trip. What can I do with my stairs? Do not leave any items on the stairs. Make sure that there are handrails on both sides of the  stairs and use them. Fix handrails that are broken or loose. Make sure that handrails are as long as the stairways. Check any carpeting to make sure that it is firmly attached to the stairs. Fix any carpet that is loose or worn. Avoid having throw rugs at the top or bottom of the stairs. If you do have throw rugs, attach them to the floor with carpet tape. Make sure that you have a light switch at the top of the stairs and the bottom of the stairs. If you do not have them, ask someone to add them for you. What else can I do to help prevent falls? Wear shoes that: Do not have high heels. Have rubber bottoms. Are comfortable and fit you well. Are closed at the toe. Do not wear sandals. If you use a stepladder: Make  sure that it is fully opened. Do not climb a closed stepladder. Make sure that both sides of the stepladder are locked into place. Ask someone to hold it for you, if possible. Clearly mark and make sure that you can see: Any grab bars or handrails. First and last steps. Where the edge of each step is. Use tools that help you move around (mobility aids) if they are needed. These include: Canes. Walkers. Scooters. Crutches. Turn on the lights when you go into a dark area. Replace any light bulbs as soon as they burn out. Set up your furniture so you have a clear path. Avoid moving your furniture around. If any of your floors are uneven, fix them. If there are any pets around you, be aware of where they are. Review your medicines with your doctor. Some medicines can make you feel dizzy. This can increase your chance of falling. Ask your doctor what other things that you can do to help prevent falls. This information is not intended to replace advice given to you by your health care provider. Make sure you discuss any questions you have with your health care provider. Document Released: 07/30/2009 Document Revised: 03/10/2016 Document Reviewed: 11/07/2014 Elsevier Interactive Patient Education  2017 [employer].

## 2022-03-03 NOTE — Progress Notes (Addendum)
Virtual Visit via Telephone Note  I connected with  Elijah Barton on 03/03/22 at  1:45 PM EDT by telephone and verified that I am speaking with the correct person using two identifiers.  Medicare Annual Wellness visit completed telephonically due to Covid-19 pandemic.   Persons participating in this call: This Health Coach and this patient.   Location: Patient: Home Provider: Office    I discussed the limitations, risks, security and privacy concerns of performing an evaluation and management service by telephone and the availability of in person appointments. The patient expressed understanding and agreed to proceed.  Unable to perform video visit due to video visit attempted and failed and/or patient does not have video capability.   Some vital signs may be absent or patient reported.   Willette Brace, LPN   Subjective:   Elijah Barton is a 66 y.o. male who presents for an Initial Medicare Annual Wellness Visit.  Review of Systems     Cardiac Risk Factors include: advanced age (>26mn, >>104women);obesity (BMI >30kg/m2);sedentary lifestyle;male gender     Objective:    There were no vitals filed for this visit. There is no height or weight on file to calculate BMI.     03/03/2022    2:12 PM 12/09/2021    8:55 AM 10/05/2021    8:37 PM 10/04/2021    5:23 PM 07/15/2018    2:26 PM 05/04/2018   12:56 PM  Advanced Directives  Does Patient Have a Medical Advance Directive? No No No No No No  Would patient like information on creating a medical advance directive? No - Patient declined  No - Patient declined  No - Patient declined Yes (ED - Information included in AVS)    Current Medications (verified) Outpatient Encounter Medications as of 03/03/2022  Medication Sig   albuterol (VENTOLIN HFA) 108 (90 Base) MCG/ACT inhaler Inhale 2 puffs into the lungs every 6 (six) hours as needed for wheezing or shortness of breath.   aspirin EC 81 MG tablet Take 1 tablet (81 mg total) by  mouth daily. Swallow whole.   B Complex Vitamins (VITAMIN B COMPLEX) TABS Take 1 tablet by mouth daily.   budesonide-formoterol (SYMBICORT) 160-4.5 MCG/ACT inhaler Inhale 2 puffs into the lungs in the morning and at bedtime.   chlordiazePOXIDE (LIBRIUM) 25 MG capsule '50mg'$  PO TID x 1D, then 25-'50mg'$  PO BID X 1D, then 25-'50mg'$  PO QD X 1D   cholecalciferol (VITAMIN D3) 25 MCG (1000 UNIT) tablet Take 1 tablet (1,000 Units total) by mouth daily.   Coenzyme Q10 (CO Q 10 PO) Take 1 tablet by mouth daily.   cyclobenzaprine (FLEXERIL) 10 MG tablet Take 1 tablet (10 mg total) by mouth 3 (three) times daily as needed for muscle spasms.   fexofenadine (ALLEGRA) 180 MG tablet Take 1 tablet (180 mg total) by mouth daily.   fluticasone (FLONASE) 50 MCG/ACT nasal spray Place 2 sprays into both nostrils daily.   folic acid (FOLVITE) 1 MG tablet Take 1 tablet (1 mg total) by mouth daily.   furosemide (LASIX) 20 MG tablet Take 2 tablets (40 mg total) by mouth daily.   gabapentin (NEURONTIN) 300 MG capsule Take 1 capsule (300 mg total) by mouth every morning AND 1 capsule (300 mg total) daily at 12 noon AND 2 capsules (600 mg total) at bedtime.   loperamide (IMODIUM) 2 MG capsule Take 1 capsule (2 mg total) by mouth 3 (three) times daily as needed for diarrhea or loose stools.   losartan (  COZAAR) 50 MG tablet Take 1 tablet (50 mg total) by mouth daily.   naproxen (NAPROSYN) 500 MG tablet Take 500 mg by mouth 2 (two) times daily.   rosuvastatin (CRESTOR) 10 MG tablet Take 1 tablet (10 mg total) by mouth at bedtime.   sertraline (ZOLOFT) 100 MG tablet Take 1.5 tablets (150 mg total) by mouth daily.   thiamine 100 MG tablet Take 1 tablet (100 mg total) by mouth daily.   Tiotropium Bromide Monohydrate (SPIRIVA RESPIMAT) 2.5 MCG/ACT AERS Inhale 2 puffs into the lungs daily.   TURMERIC PO Take 1 tablet by mouth 2 (two) times daily.   doxycycline (VIBRA-TABS) 100 MG tablet Take 1 tablet (100 mg total) by mouth 2 (two) times  daily. (Patient not taking: Reported on 12/09/2021)   umeclidinium bromide (INCRUSE ELLIPTA) 62.5 MCG/ACT AEPB Inhale 1 puff into the lungs daily. (Patient not taking: Reported on 03/03/2022)   No facility-administered encounter medications on file as of 03/03/2022.    Allergies (verified) Penicillins   History: Past Medical History:  Diagnosis Date   Alcohol abuse    Allergies    Arthritis    Back pain    Cataracts, bilateral    COPD (chronic obstructive pulmonary disease) (HCC)    Fatty liver    Glaucoma    History of depression    Hypertension    Joint pain    all over except for hip   Peripheral neuropathy    Substance abuse ([place])    Past Surgical History:  Procedure Laterality Date   LYMPH NODE BIOPSY  1970   neck   TONSILLECTOMY AND ADENOIDECTOMY  1968   Family History  Problem Relation Age of Onset   Colon cancer Mother    Heart disease Mother    Stroke Mother    Cancer Mother        lung   Alcohol abuse Mother    Arthritis Mother    Asthma Mother    COPD Mother    Depression Mother    Cirrhosis Father    Alcohol abuse Father    Alcohol abuse Brother    Social History   Socioeconomic History   Marital status: Divorced    Spouse name: n/a   Number of children: 0   Years of education: Not on file   Highest education level: Not on file  Occupational History   Occupation: Armed forces technical officer    Employer: [employer]  Tobacco Use   Smoking status: Every Day    Packs/day: 3.00    Years: 40.00    Pack years: 120.00    Types: Cigarettes   Smokeless tobacco: Never  Vaping Use   Vaping Use: Former  Substance and Sexual Activity   Alcohol use: Yes    Alcohol/week: 15.0 standard drinks    Types: 15 Standard drinks or equivalent per week    Comment: 0-20 drinks/week. Pint of Liquor day    Drug use: Yes    Types: Marijuana   Sexual activity: Yes    Birth control/protection: Condom  Other Topics Concern   Not on file  Social History  Narrative   Lives with his girlfriend with his 2 cats. Mother died in 03/01/2013 in [place], [place] in her home with Hospice.  Daughter lives in [place], [place].   Social Determinants of Health   Financial Resource Strain: Low Risk    Difficulty of Paying Living Expenses: Not hard at all  Food Insecurity: No Food Insecurity   Worried  About Running Out of Food in the Last Year: Never true   Ran Out of Food in the Last Year: Never true  Transportation Needs: No Transportation Needs   Lack of Transportation (Medical): No   Lack of Transportation (Non-Medical): No  Physical Activity: Inactive   Days of Exercise per Week: 0 days   Minutes of Exercise per Session: 0 min  Stress: Stress Concern Present   Feeling of Stress : To some extent  Social Connections: Socially Isolated   Frequency of Communication with Friends and Family: More than three times a week   Frequency of Social Gatherings with Friends and Family: Never   Attends Religious Services: Never   Printmaker: No   Attends Music therapist: Never   Marital Status: Divorced    Tobacco Counseling Ready to quit: Not Answered Counseling given: Not Answered   Clinical Intake:  Pre-visit preparation completed: Yes  Pain : No/denies pain     BMI - recorded: 42.89 Nutritional Status: BMI > 30  Obese Nutritional Risks: None Diabetes: No  How often do you need to have someone help you when you read instructions, pamphlets, or other written materials from your doctor or pharmacy?: 1 - Never  Diabetic?no  Interpreter Needed?: No  Information entered by :: Charlott Rakes, LPN   Activities of Daily Living    03/03/2022    2:15 PM 10/05/2021   10:31 PM  In your present state of health, do you have any difficulty performing the following activities:  Hearing? 0 0  Vision? 0 0  Difficulty concentrating or making decisions? 0 0  Walking or climbing stairs? 1 0  Comment difficulty    Dressing or bathing? 0 0  Doing errands, shopping? 0 0  Preparing Food and eating ? N   Using the Toilet? N   In the past six months, have you accidently leaked urine? Y   Do you have problems with loss of bowel control? Y   Managing your Medications? N   Managing your Finances? N   Housekeeping or managing your Housekeeping? N     Patient Care Team: Maximiano Coss, NP as PCP - General (Adult Health Nurse Practitioner) Madelin Rear, [facility] as Pharmacist (Pharmacist)  Indicate any recent Medical Services you may have received from other than [facility] providers in the past year (date may be approximate).     Assessment:   This is a routine wellness examination for Elijah Barton.  Hearing/Vision screen Hearing Screening - Comments:: Pt denies any hearing issues  Vision Screening - Comments:: Encouraged to follow up with provider   Dietary issues and exercise activities discussed: Current Exercise Habits: The patient does not participate in regular exercise at present   Goals Addressed             This Visit's Progress    Patient Stated       Get stronger to care for girlfriend        Depression Screen    03/03/2022    2:11 PM 02/08/2022   11:33 AM 12/02/2021    1:27 PM 11/11/2021    8:53 AM 10/13/2021    2:53 PM 10/08/2021    8:01 AM 09/21/2021   12:09 PM  PHQ 2/9 Scores  PHQ - 2 Score '1 1 2 6 '$ 0 2 0  PHQ- 9 Score '4 4 12 9 '$ 0 5 0    Fall Risk    03/03/2022    2:13 PM 02/08/2022  11:33 AM 12/13/2021   12:20 PM 12/02/2021    1:26 PM 11/11/2021    8:52 AM  Fall Risk   Falls in the past year? '1 1 1 '$ 0 1  Number falls in past yr: 1 0 0 0 0  Injury with Fall? '1 1 1 '$ 0 1  Comment right knee      Risk for fall due to : Impaired vision;Impaired balance/gait;Impaired mobility History of fall(s) History of fall(s) No Fall Risks History of fall(s)  Follow up Falls prevention discussed Falls evaluation completed Falls evaluation completed Falls evaluation completed Falls evaluation  completed    [place]:  Any stairs in or around the home? No  If so, are there any without handrails? No  Home free of loose throw rugs in walkways, pet beds, electrical cords, etc? Yes  Adequate lighting in your home to reduce risk of falls? Yes   ASSISTIVE DEVICES UTILIZED TO PREVENT FALLS:  Life alert? No  Use of a cane, walker or w/c? No  Grab bars in the bathroom? No  Shower chair or bench in shower? Yes  Elevated toilet seat or a handicapped toilet? No   TIMED UP AND GO:  Was the test performed? No .   Cognitive Function:        03/03/2022    2:16 PM  6CIT Screen  What Year? 0 points  What month? 0 points  What time? 0 points  Count back from 20 0 points  Months in reverse 0 points  Repeat phrase 0 points  Total Score 0 points    Immunizations Immunization History  Administered Date(s) Administered   Influenza,inj,Quad PF,6+ Mos 10/12/2014   PFIZER(Purple Top)SARS-COV-2 Vaccination 06/17/2020, 07/22/2020   Td 12/20/2018    TDAP status: Up to date  Flu Vaccine status: Due, Education has been provided regarding the importance of this vaccine. Advised may receive this vaccine at local pharmacy or Health Dept. Aware to provide a copy of the vaccination record if obtained from local pharmacy or Health Dept. Verbalized acceptance and understanding.  Pneumococcal vaccine status: Due, Education has been provided regarding the importance of this vaccine. Advised may receive this vaccine at local pharmacy or Health Dept. Aware to provide a copy of the vaccination record if obtained from local pharmacy or Health Dept. Verbalized acceptance and understanding.  Covid-19 vaccine status: Completed vaccines  Qualifies for Shingles Vaccine? Yes   Zostavax completed No   Shingrix Completed?: No.    Education has been provided regarding the importance of this vaccine. Patient has been advised to call insurance company to determine out of  pocket expense if they have not yet received this vaccine. Advised may also receive vaccine at local pharmacy or Health Dept. Verbalized acceptance and understanding.  Screening Tests Health Maintenance  Topic Date Due   Pneumonia Vaccine 20+ Years old (1 - PCV) Never done   Zoster Vaccines- Shingrix (1 of 2) Never done   COVID-19 Vaccine (3 - Booster for Pfizer series) 09/16/2020   COLONOSCOPY (Pts 45-64yr Insurance coverage will need to be confirmed)  07/14/2022 (Originally 05/19/2014)   INFLUENZA VACCINE  05/17/2022   TETANUS/TDAP  12/19/2028   Hepatitis C Screening  Completed   HPV VACCINES  Aged Out    Health Maintenance  Health Maintenance Due  Topic Date Due   Pneumonia Vaccine 66 Years old (1 - PCV) Never done   Zoster Vaccines- Shingrix (1 of 2) Never done   COVID-19 Vaccine (3 -  Booster for [employer] series) 09/16/2020    Colorectal cancer screening: Type of screening: Colonoscopy. Completed 05/20/11. Repeat every 3 years pt stated he will follow up later    Additional Screening:  Hepatitis C Screening:  Completed 03/12/13  Vision Screening: Recommended annual ophthalmology exams for early detection of glaucoma and other disorders of the eye. Is the patient up to date with their annual eye exam?  No  Who is the provider or what is the name of the office in which the patient attends annual eye exams? Encouraged to follow up If pt is not established with a provider, would they like to be referred to a provider to establish care? No .   Dental Screening: Recommended annual dental exams for proper oral hygiene  Community Resource Referral / Chronic Care Management: CRR required this visit?  No   CCM required this visit?  No      Plan:     I have personally reviewed and noted the following in the patient's chart:   Medical and social history Use of alcohol, tobacco or illicit drugs  Current medications and supplements including opioid prescriptions. Patient is not  currently taking opioid prescriptions. Functional ability and status Nutritional status Physical activity Advanced directives List of other physicians Hospitalizations, surgeries, and ER visits in previous 12 months Vitals Screenings to include cognitive, depression, and falls Referrals and appointments  In addition, I have reviewed and discussed with patient certain preventive protocols, quality metrics, and best practice recommendations. A written personalized care plan for preventive services as well as general preventive health recommendations were provided to patient.     Willette Brace, LPN   0/13/1438   Nurse Notes: none

## 2022-03-07 DIAGNOSIS — R0902 Hypoxemia: Secondary | ICD-10-CM | POA: Diagnosis not present

## 2022-03-07 DIAGNOSIS — J441 Chronic obstructive pulmonary disease with (acute) exacerbation: Secondary | ICD-10-CM | POA: Diagnosis not present

## 2022-03-14 DIAGNOSIS — E261 Secondary hyperaldosteronism: Secondary | ICD-10-CM | POA: Diagnosis not present

## 2022-03-14 DIAGNOSIS — I700 Atherosclerosis of aorta: Secondary | ICD-10-CM | POA: Diagnosis not present

## 2022-03-14 DIAGNOSIS — J449 Chronic obstructive pulmonary disease, unspecified: Secondary | ICD-10-CM | POA: Diagnosis not present

## 2022-03-14 DIAGNOSIS — I110 Hypertensive heart disease with heart failure: Secondary | ICD-10-CM | POA: Diagnosis not present

## 2022-03-14 DIAGNOSIS — I509 Heart failure, unspecified: Secondary | ICD-10-CM | POA: Diagnosis not present

## 2022-03-14 DIAGNOSIS — R69 Illness, unspecified: Secondary | ICD-10-CM | POA: Diagnosis not present

## 2022-03-14 DIAGNOSIS — Z6841 Body Mass Index (BMI) 40.0 and over, adult: Secondary | ICD-10-CM | POA: Diagnosis not present

## 2022-03-14 DIAGNOSIS — J9611 Chronic respiratory failure with hypoxia: Secondary | ICD-10-CM | POA: Diagnosis not present

## 2022-03-14 DIAGNOSIS — E785 Hyperlipidemia, unspecified: Secondary | ICD-10-CM | POA: Diagnosis not present

## 2022-03-21 ENCOUNTER — Encounter (HOSPITAL_COMMUNITY): Payer: Self-pay | Admitting: Radiology

## 2022-03-26 DIAGNOSIS — J441 Chronic obstructive pulmonary disease with (acute) exacerbation: Secondary | ICD-10-CM | POA: Diagnosis not present

## 2022-03-26 DIAGNOSIS — J42 Unspecified chronic bronchitis: Secondary | ICD-10-CM | POA: Diagnosis not present

## 2022-03-30 DIAGNOSIS — R404 Transient alteration of awareness: Secondary | ICD-10-CM | POA: Diagnosis not present

## 2022-04-01 ENCOUNTER — Telehealth: Payer: Self-pay

## 2022-04-01 NOTE — Telephone Encounter (Signed)
Is there any additional information regarding apparent cause of death or what EMS found when they arrived or why they were dispatched b/c with the information given, I'm not sure we could provide cause of death on an official document

## 2022-04-01 NOTE — Telephone Encounter (Signed)
I'm honestly not sure how to do that as death certificates are now electronically assigned to [place] through the [place] registry system.

## 2022-04-01 NOTE — Telephone Encounter (Signed)
Date of passing 04/20/22 time 14:53  [place]  Death certificate needs to be signed, pt was found in his home on the date and time listed above has requested someone review and sign given Maximiano Coss being out until 04/11/22 I informed her this is not usually done considering this is neither patient but I will request.   [facility], call back at [phone] [place]

## 2022-04-04 NOTE — Telephone Encounter (Signed)
I called funeral home, will have them change completing party  on [place] to me in Richard's absence.   Time of death of death 69, on April 22, 2023. EMS did respond to home. Determined was natural. Medical examiner declined case. Per Nurse, learning disability pt had been ill for awhile, family had completed paperwork for HCPOA. Will contact [facility] to see if other details available then I will complete certificate.

## 2022-04-04 NOTE — Telephone Encounter (Signed)
Unfortunately this was the only information the woman could provide there was no further information

## 2022-04-05 NOTE — Telephone Encounter (Addendum)
Received notification form [facility]. [facility] to obtain further information to complete death certificate. Per EMS call record review, noted that a delivery driver called [phone] as stack of mail on porch and strong odor from house. EMS found patient deceased in home, in hallway, obvious signs of death with decomposition and lividity. Called time of death 2:53 pm on 08-Apr-2023. No mention of sign of trauma or other signs of injury noted on exam.  Appears to have died of natural causes.  Will complete death certificate based on his health history.  Hx of oxygen dependent COPD. Depression. 5/25 counselor note indicated was drinking to cope with loss of partner. Suspect respiratory arrest possible from COPD, unknown if alcohol component as no mention of this at scene by EMS notes.   Certificate completed with COPD, respiratory arrest as likely cause of death with other contributing conditions. Funeral home advised.

## 2022-04-05 NOTE — Telephone Encounter (Signed)
Funeral home called again asking for an update on this as the patients family has been waiting for some time now for pt to be cremated.

## 2022-04-07 DIAGNOSIS — J441 Chronic obstructive pulmonary disease with (acute) exacerbation: Secondary | ICD-10-CM | POA: Diagnosis not present

## 2022-04-07 DIAGNOSIS — R0902 Hypoxemia: Secondary | ICD-10-CM | POA: Diagnosis not present

## 2022-04-16 DIAGNOSIS — 419620001 Death: Secondary | SNOMED CT | POA: Diagnosis not present

## 2022-04-16 DEATH — deceased
